# Patient Record
Sex: Male | Born: 1955 | Race: Black or African American | Hispanic: No | Smoking: Former smoker
Health system: Southern US, Community
[De-identification: ages and names within clinical notes are randomized; demographics above are authoritative.]

## PROBLEM LIST (undated history)

## (undated) DIAGNOSIS — F4310 Post-traumatic stress disorder, unspecified: Secondary | ICD-10-CM

## (undated) DIAGNOSIS — R7303 Prediabetes: Secondary | ICD-10-CM

## (undated) DIAGNOSIS — F419 Anxiety disorder, unspecified: Secondary | ICD-10-CM

## (undated) DIAGNOSIS — T8859XA Other complications of anesthesia, initial encounter: Secondary | ICD-10-CM

## (undated) DIAGNOSIS — T4145XA Adverse effect of unspecified anesthetic, initial encounter: Secondary | ICD-10-CM

## (undated) DIAGNOSIS — R413 Other amnesia: Secondary | ICD-10-CM

## (undated) DIAGNOSIS — B1920 Unspecified viral hepatitis C without hepatic coma: Secondary | ICD-10-CM

## (undated) DIAGNOSIS — K219 Gastro-esophageal reflux disease without esophagitis: Secondary | ICD-10-CM

## (undated) DIAGNOSIS — Z8619 Personal history of other infectious and parasitic diseases: Secondary | ICD-10-CM

## (undated) DIAGNOSIS — N179 Acute kidney failure, unspecified: Secondary | ICD-10-CM

## (undated) DIAGNOSIS — I639 Cerebral infarction, unspecified: Secondary | ICD-10-CM

## (undated) DIAGNOSIS — I10 Essential (primary) hypertension: Secondary | ICD-10-CM

## (undated) DIAGNOSIS — F329 Major depressive disorder, single episode, unspecified: Secondary | ICD-10-CM

## (undated) DIAGNOSIS — F32A Depression, unspecified: Secondary | ICD-10-CM

## (undated) DIAGNOSIS — Z9289 Personal history of other medical treatment: Secondary | ICD-10-CM

## (undated) DIAGNOSIS — E7800 Pure hypercholesterolemia, unspecified: Secondary | ICD-10-CM

## (undated) HISTORY — PX: SHOULDER SURGERY: SHX246

## (undated) HISTORY — PX: NASAL SINUS SURGERY: SHX719

## (undated) HISTORY — PX: TONSILLECTOMY: SUR1361

## (undated) HISTORY — PX: COLONOSCOPY: SHX174

---

## 1998-11-03 ENCOUNTER — Ambulatory Visit (HOSPITAL_COMMUNITY): Admission: RE | Admit: 1998-11-03 | Discharge: 1998-11-03 | Payer: Self-pay | Admitting: Internal Medicine

## 1998-11-03 ENCOUNTER — Encounter: Payer: Self-pay | Admitting: Internal Medicine

## 1999-06-08 ENCOUNTER — Encounter: Payer: Self-pay | Admitting: Neurosurgery

## 1999-06-08 ENCOUNTER — Ambulatory Visit (HOSPITAL_COMMUNITY): Admission: RE | Admit: 1999-06-08 | Discharge: 1999-06-08 | Payer: Self-pay | Admitting: Neurosurgery

## 2000-05-01 ENCOUNTER — Emergency Department (HOSPITAL_COMMUNITY): Admission: EM | Admit: 2000-05-01 | Discharge: 2000-05-01 | Payer: Self-pay

## 2001-03-13 ENCOUNTER — Ambulatory Visit (HOSPITAL_BASED_OUTPATIENT_CLINIC_OR_DEPARTMENT_OTHER): Admission: RE | Admit: 2001-03-13 | Discharge: 2001-03-13 | Payer: Self-pay | Admitting: Orthopedic Surgery

## 2001-07-09 ENCOUNTER — Emergency Department (HOSPITAL_COMMUNITY): Admission: EM | Admit: 2001-07-09 | Discharge: 2001-07-09 | Payer: Self-pay | Admitting: Emergency Medicine

## 2001-10-23 ENCOUNTER — Ambulatory Visit (HOSPITAL_BASED_OUTPATIENT_CLINIC_OR_DEPARTMENT_OTHER): Admission: RE | Admit: 2001-10-23 | Discharge: 2001-10-23 | Payer: Self-pay | Admitting: Orthopedic Surgery

## 2002-12-15 ENCOUNTER — Ambulatory Visit (HOSPITAL_BASED_OUTPATIENT_CLINIC_OR_DEPARTMENT_OTHER): Admission: RE | Admit: 2002-12-15 | Discharge: 2002-12-15 | Payer: Self-pay | Admitting: Orthopedic Surgery

## 2003-12-27 ENCOUNTER — Emergency Department (HOSPITAL_COMMUNITY): Admission: EM | Admit: 2003-12-27 | Discharge: 2003-12-27 | Payer: Self-pay | Admitting: *Deleted

## 2004-09-02 ENCOUNTER — Ambulatory Visit (HOSPITAL_COMMUNITY): Admission: RE | Admit: 2004-09-02 | Discharge: 2004-09-02 | Payer: Self-pay | Admitting: Gastroenterology

## 2005-01-06 ENCOUNTER — Emergency Department (HOSPITAL_COMMUNITY): Admission: EM | Admit: 2005-01-06 | Discharge: 2005-01-06 | Payer: Self-pay | Admitting: Emergency Medicine

## 2006-08-01 ENCOUNTER — Encounter: Admission: RE | Admit: 2006-08-01 | Discharge: 2006-08-01 | Payer: Self-pay | Admitting: Otolaryngology

## 2007-12-18 ENCOUNTER — Ambulatory Visit (HOSPITAL_BASED_OUTPATIENT_CLINIC_OR_DEPARTMENT_OTHER): Admission: RE | Admit: 2007-12-18 | Discharge: 2007-12-18 | Payer: Self-pay | Admitting: Orthopedic Surgery

## 2008-12-31 ENCOUNTER — Ambulatory Visit: Payer: Self-pay | Admitting: Gastroenterology

## 2009-01-07 ENCOUNTER — Encounter (INDEPENDENT_AMBULATORY_CARE_PROVIDER_SITE_OTHER): Payer: Self-pay | Admitting: Diagnostic Radiology

## 2009-01-07 ENCOUNTER — Ambulatory Visit (HOSPITAL_COMMUNITY): Admission: RE | Admit: 2009-01-07 | Discharge: 2009-01-07 | Payer: Self-pay | Admitting: Gastroenterology

## 2009-03-09 ENCOUNTER — Ambulatory Visit: Payer: Self-pay | Admitting: Gastroenterology

## 2010-04-06 ENCOUNTER — Emergency Department (HOSPITAL_COMMUNITY): Admission: EM | Admit: 2010-04-06 | Discharge: 2010-04-06 | Payer: Self-pay | Admitting: Emergency Medicine

## 2010-04-06 ENCOUNTER — Emergency Department (HOSPITAL_COMMUNITY): Admission: EM | Admit: 2010-04-06 | Discharge: 2010-04-06 | Payer: Self-pay | Admitting: Family Medicine

## 2010-04-07 ENCOUNTER — Emergency Department (HOSPITAL_COMMUNITY): Admission: EM | Admit: 2010-04-07 | Discharge: 2010-04-07 | Payer: Self-pay | Admitting: Emergency Medicine

## 2010-10-27 LAB — URINALYSIS, ROUTINE W REFLEX MICROSCOPIC
Bilirubin Urine: NEGATIVE
Glucose, UA: NEGATIVE mg/dL
Ketones, ur: NEGATIVE mg/dL
Protein, ur: NEGATIVE mg/dL
Urobilinogen, UA: 4 mg/dL — ABNORMAL HIGH (ref 0.0–1.0)
pH: 7.5 (ref 5.0–8.0)

## 2010-10-27 LAB — URINE MICROSCOPIC-ADD ON

## 2010-10-27 LAB — POCT URINALYSIS DIPSTICK
Bilirubin Urine: NEGATIVE
Ketones, ur: NEGATIVE mg/dL
Protein, ur: NEGATIVE mg/dL
Urobilinogen, UA: >=8 mg/dL (ref 0.0–1.0)
pH: 6.5 (ref 5.0–8.0)

## 2010-10-27 LAB — URINE CULTURE: Colony Count: NO GROWTH

## 2010-11-22 LAB — CBC
Hemoglobin: 15.9 g/dL (ref 13.0–17.0)
MCV: 91.8 fL (ref 78.0–100.0)
RBC: 5.13 MIL/uL (ref 4.22–5.81)

## 2010-12-27 NOTE — Op Note (Signed)
Kyle Mcconnell, Kyle Mcconnell             ACCOUNT NO.:  [id number]   MEDICAL RECORD NO.:  [id number]          PATIENT TYPE:  AMB   LOCATION:  DSC                          FACILITY:  [facility]   PHYSICIAN:  Kyle Mcconnell, M.D.   DATE OF BIRTH:  [birth year]   DATE OF PROCEDURE:  12/18/2007  DATE OF DISCHARGE:                               OPERATIVE REPORT   PREOPERATIVE DIAGNOSIS:  Impingement with a questionable rotator cuff  tear and superior labral fray.   POSTOPERATIVE DIAGNOSES:  1. Impingement.  2. Acromio-clavicular joint degenerative arthritis.  3. Chronic rotator cuff tear.  4. Severe biceps fray with superior labral tear.   PROCEDURE:  1. Mini-open rotator cuff repair chronically torn rotator cuff,      acromioplasty.  2. Arthroscopic distal clavicle resection.  3. Open biceps tenodesis.  4. Debridement in the glenohumeral joint of superior labrum anterior      to posterior and chondral defects within the glenoid.   SURGEON:  Kyle Junior, MD   ASSISTANT:  Kyle Ly, PA.   ANESTHESIA:  General.   BRIEF HISTORY:  Kyle Mcconnell is a 55 year old gentleman with a long  history of having had right shoulder pain.  We treated him  arthroscopically previously with also with acromioplasty, distal  clavicle resection.  He had a recent injury and having increasing pain  in the shoulder.  We evaluated him with MRI examination which showed  that he had a question of thinned area at the rotator cuff with a distal  supraspinatus insertion and some labral issues posteriorly.  We treated  him conservatively for a period of time and none of this worked, because  of continued complaints of pain, he was also taken to the operating room  for evaluation and surgery as necessary.   PROCEDURE:  The patient was taken to the operating room.  After adequate  anesthesia obtained with general anesthetic the patient was placed  supine on the operating table.  The right arm prepped and draped in  usual sterile fashion.  Following this, routine arthroscopic examination  of the shoulder revealed the glenohumeral joint at the biceps tendon was  frayed and attachment of the superior labrum was really less than gray  it was wanting to lift off and it was wanting to pulled out into the  joint.  The anterior labrum was coming forward, posterior labrum was  pulling down into the joint.  The glenoid had some grade 2 changes which  we debrided and went up the rotator cuff and there was an obvious distal  rotator cuff tear at the supraspinatus insertion.  At this point, biceps  tenodesis seem to be most proper course of action given that we felt  that we needed to do a rotator cuff repair on him.  Anyway, at this  point a biceps tendon was released within the glenohumeral joint.  Following this, attention was turned to the superior labrum where the  superior labrum anteriorly was debrided.  A camera was put in the front  and we looked at the back and debrided the posterior labrum so this not  be catching the joint.  We then looked at the glenoid done inferiorly,  and particularly done inferiorly and posteriorly, there was some glenoid  wear which was debrided, this showed a grade 2 cartilage defect.  Once  this was completed, attention was turned into the subacromial space  where an anterolateral acromioplasty was performed distal clavicle  resection over about 18 mm and then attention was turned to the rotator  cuff where obvious tear was there.  At this point, we felt that mini-  open rotator cuff could be appropriate because we wanted to do a biceps  tenodesis as well and so the arthroscopic portion of this case was  abandoned and attention was then turned towards the mini-open procedure.  At this point, an incision was made laterally in the subcutaneous at the  level of the deltoid.  The deltoid was divided along with the fibrous  node upon to the acromion.  At this point, a retractor was  put in place.  A bursectomy was performed.  Then, attention was turned towards the  rotator cuff tear which was easily identified at the supraspinatus.  We  extended a little bit anteriorly and got into the bicipital groove and  the biceps tendon was easily identified at this point.  At this point, a  5-5 anchor was placed in the biceps groove and a single suture was  placed strictly to the biceps tendon then another suture was placed at  the biceps tendon and this lateral rotator cuff and these were repaired  on top of one another.  Attention then turned more posteriorly where a  single 5-5 anchor was placed and then 2 horizontal mattress sutures were  placed and this was then brought over laterally to push lock giving it  an excellent double row repair.  At this point, the wound was copiously  and thoroughly irrigated, suctioned dry, and also portals were closed  with a bandage.  The deltoid was closed with 1 Vicryl running.  The skin  with 2-0 Vicryl and 3-0 Monocryl subcuticular stitch.  Benzoin, Steri-  strips were applied.  Sterile compression dressing was applied and the  patient was taken to the recovery room and was noted to be in  satisfactory condition.   ESTIMATED BLOOD LOSS:  None.      Kyle Mcconnell, M.D.  Electronically Signed     JLG/MEDQ  D:  12/18/2007  T:  12/19/2007  Job:  [id number]

## 2010-12-30 NOTE — Op Note (Signed)
Kyle Mcconnell, Kyle Mcconnell                         ACCOUNT NO.:  [id number]   MEDICAL RECORD NO.:  [id number]                   PATIENT TYPE:  AMB   LOCATION:  DSC                                  FACILITY:  [facility]   PHYSICIAN:  Harvie Junior, M.D.                DATE OF BIRTH:  May 14, 1956   DATE OF PROCEDURE:  12/15/2002  DATE OF DISCHARGE:                                 OPERATIVE REPORT   PREOPERATIVE DIAGNOSIS:  Impingement with acromioclavicular joint arthritis.   POSTOPERATIVE DIAGNOSIS:  1. Impingement with  2. Acromioclavicular joint arthritis.  3. Rotator cuff partial thickness tearing undersurface and anterior labral     tear.   OPERATION PERFORMED:  1. Anterolateral acromioplasty via a lateral and posterior portal.  2. Distal clavicle resection of the anterior portal.  3. Debridement of glenohumeral joint anterior labral tear and undersurface     partial thickness rotator cuff tear via anterior portal.   SURGEON:  Harvie Junior, M.D.   ASSISTANT:  Marshia Ly, P.A.   ANESTHESIA:  General with interscalene block.   INDICATIONS FOR PROCEDURE:  The patient is a 55 year old male with a long  history of left shoulder problems.  He ultimately as treated for his left  shoulder problems.  He began having increasing right shoulder problems,  evaluated by Callie Fielding, M.D. and felt to have right shoulder  impingement.  He had a thorough evaluation including MRI examination and  because of failure of conservative care and continued complaints of pain, he  was taken to the operating room for subacromial decompression and distal  clavicle resection and other as needed.   DESCRIPTION OF PROCEDURE:  The patient was brought to the operating room  where after adequate anesthesia was obtained with a general anesthetic, the  patient was placed supine on the operating table.  He was then moved into  the beach chair position.  All bony prominences were well padded.  At this  time  attention was turned to the right shoulder where after routine prepping  and draping, an arthroscopic examination of the shoulder was undertaken.  This examination showed that there was an obvious anterior labral tear as  well as an undersurface rotator cuff tear.  These were debrided from the  glenohumeral joint.  The posterior labrum was evaluated and felt to be  within normal limits.  The biceps tendon was evaluated and felt to be within  normal limits.  After a thorough debridement of his undersurface rotator  cuff tear, this area was probed superiorly.  There was no full thickness  area that could be identified.  At this point attention was turned out of  the glenohumeral joint.  The cannula was left in place here and attention  was turned to the subacromial spade.  An anterolateral acromioplasty was  performed to take a large anterior spur off of the humeral head.  Once this  done from the lateral portal, the camera was changed laterally and the bur  was taken to the posterior portion.  A cutting block technique was then used  medial to lateral to make sure that there was a large open space in the  subacromial space.  At this point, attention was turned to the distal  clavicle where 15 mm of distal clavicle was resected anterior to posterior.  Following this, the subacromial space was debrided with a suction shaver.  There was no evidence of partial thickness rotator cuff tearing but there  was a significant amount of bursal inflammation.  This was debrided as well  as products of the bony resection at this point.  At this point the shoulder  was copiously irrigated and suctioned dry.  The arthroscopic portals were  closed with band-aids.  A sterile compressive dressing was applied.  The  patient was taken to the recovery room where he was noted to be in  satisfactory condition.  The estimated blood loss for this procedure was  none.                                                 Harvie Junior, M.D.    Ranae Plumber  D:  12/15/2002  T:  12/15/2002  Job:  [id number]

## 2010-12-30 NOTE — Op Note (Signed)
[facility]  Patient:    Kyle Mcconnell, Kyle Mcconnell Visit Number: [id number] MRN: [id number]          Service Type: Attending:  Harvie Junior, M.D. Dictated by:   Harvie Junior, M.D. Proc. Date: 10/23/01                             Operative Report  PREOPERATIVE DIAGNOSES:  Painful shoulder status post a previous subacromial decompression with distal clavicle excision and with a painful mass just posterior to the distal clavicle.  POSTOPERATIVE DIAGNOSES:  Painful shoulder status post a previous subacromial decompression with distal clavicle excision and with a painful mass just posterior to the distal clavicle.  PRINCIPAL PROCEDURE: 1. Shoulder arthroscopy with debridement of posterior labral tear and    synovectomy in the subacromial space. 2. Open excision of mass with debridement of posterior trapezial fibers    and tissue at the superior AC joint, some cartilage and scar type tissue.  SURGEON:  Harvie Junior, M.D.  ASSISTANT:  Currie Paris. Thedore Mins.  ANESTHESIA:  General.  BRIEF HISTORY:  This is a 55 year old male with a long history of having had left shoulder pain. He ultimately underwent a subacromial decompression with a distal clavicle excision. He did well from his impingement type pain but also began having some pain essentially at the area of the distal clavicle. He had been injected in this area multiple times each of which helped with pain relief but did not cure his problem. Because of continued complaints of pain and ultimately wanting to get his situation cleared through workers comp, he was taken back to the operating room for exposure of this area and a look in the glenohumeral joint to make sure that everything was fine in that area. He was brought to the operating room for this procedure.  DESCRIPTION OF PROCEDURE:  The patient was brought to the operating room and after an adequate level of general anesthesia, the patient  placed in the supine position and then moved to the beach-chair position. All bony prominences were well padded. Attention was then turned to the left shoulder where a routine arthroscopic examination revealed there was a posterior labral tear. This was debrided from the front back to a smooth and stable rim. Attention was then turned up into the area of the rotator cuff insertion which also had some fray and this was debrided back to bleeding rim. At this time, attention was turned anteriorly where a spinal needle was placed through the area of maximal tenderness. This did come into the area of the distal clavicle excision. At this point, it was felt that an exposure in this area would be appropriate and the scope cannula was removed and attention was turned to this area. An incision was made over the distal clavicle, the subcutaneous tissue dissected down to the level of the distal clavicle. The deltotrapezial fascia was opened. There was one remaining piece of cartilaginous type tissue in this distal clavicle area. This was clearly defined and excised. There was some cartilaginous type tissue in the trapezial muscle insertion to bone and this was debrided. There was some other scar tissue which was debrided in this area as well. Following this, the deltotrapezial fascia was closed with an #0 Vicryl running suture after copious irrigation of this area and cauterization of the bleeding edges. At this point, the skin was closed with #0 and 2-0 Vicryl and the skin with  a 3-0 Maxon suture. A sterile and compressive dressing was applied and the patient was taken to the recovery room and was noted to be in satisfactory condition. Estimated blood loss for the procedure was less than 10 cc. Dictated by:   Harvie Junior, M.D. Attending:  Harvie Junior, M.D. DD:  10/23/01 TD:  10/23/01 Job: [id number]

## 2010-12-30 NOTE — Op Note (Signed)
[place]  Patient:    Kyle Mcconnell, Kyle Mcconnell                      MRN: [id number] Proc. Date: 03/13/01 Adm. Date:  [id number] Attending:  Milly Jakob                           Operative Report  PREOPERATIVE DIAGNOSES: 1. Posterior labral tear. 2. Acromioclavicular joint arthritis. 3. Partial rotator cuff tear with lateral detachment.  POSTOPERATIVE DIAGNOSES: 1. Posterior labral tear. 2. Acromioclavicular joint arthritis. 3. Partial rotator cuff tear with lateral detachment.  PROCEDURES: 1. Anterolateral subacromial decompression. 2. Distal clavicle excision. 3. Debridement of posterior labral tear, superior labral tear, and    anterior labral tear.  SURGEON:  Harvie Junior, M.D.  ANESTHESIA:  Interscalene block and general.  BRIEF HISTORY:  He is a 55 year old male with a long history of having neck and left shoulder pain.  He ultimately had been evaluated by Dr. Murray Hodgkins for a prolonged period of time and ultimately was sent over for evaluation of his left shoulder pain, given failure of conservative care.  His MRI had shown that he had significant labral pathology, mostly posterior inferior, also in the midportion posteriorly.  They felt that his biceps tendon was in reasonable shape.  They showed that he had some anterior leading edge supraspinatus insufficiency.  This was shown by MRI and also his [facility] joint arthritis and impingement.  Because of failure of conservative care, the patient was brought to the operating room for fixation of these problems.  DESCRIPTION OF PROCEDURE:  Patient brought to the operating room, where after adequate anesthesia was obtained with a general anesthetic, the patient was placed supine on the operating table, was then moved to the beach chair position.  All bony prominences were well-padded.  The left shoulder was then prepped and draped in the usual sterile fashion.  Following this,  routine arthroscopic examination of the shoulder revealed that there was obvious labral tearing anteriorly.  This was debrided with a suction shaver.  The biceps tendon was loose but anchored, could not be pulled into the joint. This was debrided near its insertion, and the attention was then turned posteriorly.  The camera was shifted in the anterior portal and the shaver introduced from behind.  The posterior labrum was identified and noted to be torn and catching in the joint.  This was debrided back to a smooth and stable rim.  Following this, attention was turned toward the rotator cuff, where the supraspinatus just next to the biceps tendon was torn partial-thickness.  This was debrided back to a smooth and stable rim.  Following this, the camera was removed from the glenohumeral joint and moved into the subacromial space.  At this point attention was turned into the subacromial space, where the anterior spur was identified.  It was a fairly significant spur.  An Arthrocare wand was used to take down the CA ligament, and the anterolateral acromioplasty was performed at this point.  Following this, attention was turned toward the [facility] joint, where the arthritic AC joint was identified and from an anterior portal, a distal clavicle excision was undertaken.  Following excision of the distal clavicle, attention was turned back, moving the camera laterally and the shaver posteriorly, and with a cutting block technique the acromion was flattened medial to lateral.  At this point the shoulder was copiously irrigated and  suctioned dry.  A final check was made.  Fifteen millimeters of distal clavicle had been excised as measured with a probe, and the anterolateral acromioplasty had been performed adequately.  The shoulder was copiously irrigated and suctioned dry, and the anterior portal was closed with interrupted sutures.  The lateral and posterior portal were closed with a bandage.  A sterile  compressive dressing was applied and the patient was taken to the recovery room, where he was noted to be in satisfactory condition. Estimated blood loss for the procedure was none. DD:  03/13/01 TD:  03/13/01 Job: [id number]

## 2010-12-30 NOTE — Op Note (Signed)
NAMECORYDON, Kyle Mcconnell             ACCOUNT NO.:  [id number]   MEDICAL RECORD NO.:  [id number]          PATIENT TYPE:  AMB   LOCATION:  ENDO                         FACILITY:  [facility]   PHYSICIAN:  John C. Madilyn Fireman, M.D.    DATE OF BIRTH:  [birth year]   DATE OF PROCEDURE:  09/02/2004  DATE OF DISCHARGE:                                 OPERATIVE REPORT   PROCEDURE PERFORMED:  Colonoscopy.   INDICATIONS FOR PROCEDURE:  Average risk colon cancer screening in a 55-year-  old patient.   PROCEDURE:  The patient was placed in the left lateral decubitus position  and placed on the pulse monitor with continuous low-flow oxygen delivered,  70 mcg IV fentanyl and 7 milligrams IV Versed. Olympus video colonoscope was  inserted into the rectum and advanced the cecum, transillumination of  McBurney's point with visualization of ileocecal valve and appendiceal  orifice. The prep was excellent. The cecum, ascending, transverse,  descending and sigmoid colon all appeared normal with no masses, polyps,  diverticula or other mucosal abnormalities. The rectum likewise appeared  normal and retroflex view of the anus revealed no obvious internal  hemorrhoids.   PLAN:  Next colon screening by sigmoidoscopy in five years.       JCH/MEDQ  D:  09/02/2004  T:  09/03/2004  Job:  [id number]   cc:   Margaretmary Bayley, M.D.  [address], [address]  [place]  [place] [address]  Fax: [phone]

## 2011-05-10 LAB — POCT HEMOGLOBIN-HEMACUE: Hemoglobin: 17.6 — ABNORMAL HIGH

## 2012-02-04 ENCOUNTER — Emergency Department (HOSPITAL_BASED_OUTPATIENT_CLINIC_OR_DEPARTMENT_OTHER)
Admission: EM | Admit: 2012-02-04 | Discharge: 2012-02-04 | Disposition: A | Discharge status: Home / Self Care | Payer: Federal, State, Local not specified - PPO | Attending: Emergency Medicine | Admitting: Emergency Medicine

## 2012-02-04 ENCOUNTER — Encounter (HOSPITAL_BASED_OUTPATIENT_CLINIC_OR_DEPARTMENT_OTHER): Payer: Self-pay | Admitting: *Deleted

## 2012-02-04 DIAGNOSIS — H109 Unspecified conjunctivitis: Secondary | ICD-10-CM | POA: Insufficient documentation

## 2012-02-04 MED ORDER — TOBRAMYCIN 0.3 % OP SOLN: 1.0000 [drp] | OPHTHALMIC | Status: AC

## 2012-02-04 NOTE — ED Provider Notes (Signed)
Medical screening examination/treatment/procedure(s) were performed by non-physician practitioner and as supervising physician I was immediately available for consultation/collaboration.   Nitin Mckowen, MD 02/04/12 2306 

## 2012-02-04 NOTE — Discharge Instructions (Signed)
Conjunctivitis Conjunctivitis is commonly called "pink eye." Conjunctivitis can be caused by bacterial or viral infection, allergies, or injuries. There is usually redness of the lining of the eye, itching, discomfort, and sometimes discharge. There may be deposits of matter along the eyelids. A viral infection usually causes a watery discharge, while a bacterial infection causes a yellowish, thick discharge. Pink eye is very contagious and spreads by direct contact. You may be given antibiotic eyedrops as part of your treatment. Before using your eye medicine, remove all drainage from the eye by washing gently with warm water and cotton balls. Continue to use the medication until you have awakened 2 mornings in a row without discharge from the eye. Do not rub your eye. This increases the irritation and helps spread infection. Use separate towels from other household members. Wash your hands with soap and water before and after touching your eyes. Use cold compresses to reduce pain and sunglasses to relieve irritation from light. Do not wear contact lenses or wear eye makeup until the infection is gone. SEEK MEDICAL CARE IF:   Your symptoms are not better after 3 days of treatment.   You have increased pain or trouble seeing.   The outer eyelids become very red or swollen.  Document Released: 09/07/2004 Document Revised: 07/20/2011 Document Reviewed: 07/31/2005 [employer] Patient Information 2012 [employer]. 

## 2012-02-04 NOTE — ED Notes (Signed)
Eye redness x 3 days

## 2012-02-04 NOTE — ED Provider Notes (Signed)
History     CSN: [id number]  Arrival date & time 02/04/12  0865   First MD Initiated Contact with Patient 02/04/12 2006      Chief Complaint  Patient presents with  . Eye Problem    (Consider location/radiation/quality/duration/timing/severity/associated sxs/prior treatment) Patient is a 56 y.o. male presenting with eye problem. The history is provided by the patient. No language interpreter was used.  Eye Problem  This is a new problem. The problem occurs constantly. The problem has been gradually worsening. There is pain in both eyes. There was no injury mechanism. The pain is mild. Associated symptoms include eye redness. He has tried nothing for the symptoms.    History reviewed. No pertinent past medical history.  Past Surgical History  Procedure Date  . Shoulder surgery     History reviewed. No pertinent family history.  History  Substance Use Topics  . Smoking status: Never Smoker   . Smokeless tobacco: Not on file  . Alcohol Use: Yes      Review of Systems  Eyes: Positive for redness.    Allergies  Review of patient's allergies indicates no known allergies.  Home Medications   Current Outpatient Rx  Name Route Sig Dispense Refill  . ALPRAZOLAM 1 MG PO TABS Oral Take 1 mg by mouth at bedtime as needed. Patient uses this medication for anxiety.    . TOBRAMYCIN SULFATE 0.3 % OP SOLN Both Eyes Place 1 drop into both eyes every 4 (four) hours. 5 mL 0    BP 158/93  Pulse 88  Temp 97.7 F (36.5 C) (Oral)  Resp 20  Ht 6' 1.5" (1.867 m)  Wt 200 lb (90.719 kg)  BMI 26.03 kg/m2  SpO2 100%  Physical Exam  Vitals reviewed. Constitutional: He appears well-developed and well-nourished.  HENT:  Head: Normocephalic and atraumatic.  Eyes: EOM are normal. Pupils are equal, round, and reactive to light.       Injected bilat conjunctiva  Neck: Normal range of motion.  Cardiovascular: Normal rate.   Pulmonary/Chest: Effort normal.  Musculoskeletal: Normal  range of motion.  Neurological: He is alert.  Skin: Skin is warm.  Psychiatric: He has a normal mood and affect.    ED Course  Procedures (including critical care time)  Labs Reviewed - No data to display No results found.   1. Conjunctivitis       MDM  tobrex opth solution        [facility] [place], [place] 02/04/12 2055

## 2012-06-11 ENCOUNTER — Emergency Department (HOSPITAL_COMMUNITY)
Admission: EM | Admit: 2012-06-11 | Discharge: 2012-06-11 | Disposition: A | Discharge status: Home / Self Care | Payer: Federal, State, Local not specified - PPO | Attending: Emergency Medicine | Admitting: Emergency Medicine

## 2012-06-11 ENCOUNTER — Encounter (HOSPITAL_COMMUNITY): Payer: Self-pay | Admitting: Emergency Medicine

## 2012-06-11 DIAGNOSIS — F3289 Other specified depressive episodes: Secondary | ICD-10-CM | POA: Insufficient documentation

## 2012-06-11 DIAGNOSIS — R066 Hiccough: Secondary | ICD-10-CM | POA: Insufficient documentation

## 2012-06-11 DIAGNOSIS — F411 Generalized anxiety disorder: Secondary | ICD-10-CM | POA: Insufficient documentation

## 2012-06-11 DIAGNOSIS — K219 Gastro-esophageal reflux disease without esophagitis: Secondary | ICD-10-CM

## 2012-06-11 DIAGNOSIS — F329 Major depressive disorder, single episode, unspecified: Secondary | ICD-10-CM | POA: Insufficient documentation

## 2012-06-11 DIAGNOSIS — B1920 Unspecified viral hepatitis C without hepatic coma: Secondary | ICD-10-CM | POA: Insufficient documentation

## 2012-06-11 DIAGNOSIS — Z79899 Other long term (current) drug therapy: Secondary | ICD-10-CM | POA: Insufficient documentation

## 2012-06-11 HISTORY — DX: Anxiety disorder, unspecified: F41.9

## 2012-06-11 HISTORY — DX: Depression, unspecified: F32.A

## 2012-06-11 HISTORY — DX: Major depressive disorder, single episode, unspecified: F32.9

## 2012-06-11 HISTORY — DX: Gastro-esophageal reflux disease without esophagitis: K21.9

## 2012-06-11 HISTORY — DX: Unspecified viral hepatitis C without hepatic coma: B19.20

## 2012-06-11 LAB — CBC
HCT: 44.1 % (ref 39.0–52.0)
MCH: 30.6 pg (ref 26.0–34.0)
MCV: 90 fL (ref 78.0–100.0)
Platelets: 198 10*3/uL (ref 150–400)
RDW: 12.8 % (ref 11.5–15.5)
WBC: 7.1 10*3/uL (ref 4.0–10.5)

## 2012-06-11 LAB — COMPREHENSIVE METABOLIC PANEL
ALT: 42 U/L (ref 0–53)
AST: 37 U/L (ref 0–37)
Chloride: 101 mEq/L (ref 96–112)
GFR calc Af Amer: >90 mL/min (ref >90)
GFR calc non Af Amer: >90 mL/min (ref >90)
Sodium: 137 mEq/L (ref 135–145)

## 2012-06-11 LAB — TROPONIN I: Troponin I: <0.3 ng/mL (ref <0.30)

## 2012-06-11 MED ORDER — GI COCKTAIL ~~LOC~~
30.0000 mL | Freq: Once | ORAL | Status: AC
  Administered 2012-06-11: 30 mL via ORAL
  Filled 2012-06-11: qty 30

## 2012-06-11 MED ORDER — FAMOTIDINE 20 MG PO TABS
20.0000 mg | ORAL_TABLET | Freq: Once | ORAL | Status: AC
  Administered 2012-06-11: 20 mg via ORAL
  Filled 2012-06-11: qty 1

## 2012-06-11 MED ORDER — PANTOPRAZOLE SODIUM 40 MG PO TBEC: 40.0000 mg | DELAYED_RELEASE_TABLET | Freq: Every day | ORAL | Status: DC

## 2012-06-11 MED ORDER — ONDANSETRON 8 MG PO TBDP
8.0000 mg | ORAL_TABLET | Freq: Once | ORAL | Status: AC
  Administered 2012-06-11: 8 mg via ORAL
  Filled 2012-06-11: qty 1

## 2012-06-11 NOTE — ED Notes (Signed)
[id number]> Expected date:<BR> Expected time:<BR> Means of arrival:<BR> Comments:<BR> GERD

## 2012-06-11 NOTE — ED Provider Notes (Signed)
History     CSN: [id number]  Arrival date & time 06/11/12  7829   First MD Initiated Contact with Patient 06/11/12 [phone]      Chief Complaint  Patient presents with  . Heartburn    (Consider location/radiation/quality/duration/timing/severity/associated sxs/prior treatment) Patient is a 56 y.o. male presenting with heartburn. The history is provided by the patient.  Heartburn Pertinent negatives include no abdominal pain, no headaches and no shortness of breath.  pt c/o 'reflux' for past 2 days. States has been constant, worse when lies flat, states will get burning/acid taste in throat and mid to upper sternal area. Non radiating. No associated nv, diaphoresis or sob. States last night caused him to have recurrent hiccoughs. Denies other episodic cp or discomfort. No exertional cp or discomfort. No unusual doe or fatigue. No abdominal pain. No nvd. Having normal bms. No fever or chills. Denies cough or uri c/o. No trouble swallowing, denies fb sensation. States has hx same, has seen GI, Dr Madilyn Fireman, denies prior upper endoscopy.   Past Medical History  Diagnosis Date  . GERD (gastroesophageal reflux disease)   . Depression   . Anxiety   . Hepatitis C     Past Surgical History  Procedure Date  . Shoulder surgery     History reviewed. No pertinent family history.  History  Substance Use Topics  . Smoking status: Never Smoker   . Smokeless tobacco: Not on file  . Alcohol Use: Yes      Review of Systems  Constitutional: Negative for fever.  HENT: Negative for neck pain.   Eyes: Negative for redness.  Respiratory: Negative for shortness of breath.   Cardiovascular: Negative for palpitations and leg swelling.  Gastrointestinal: Positive for heartburn. Negative for abdominal pain.  Genitourinary: Negative for flank pain.  Musculoskeletal: Negative for back pain.  Skin: Negative for rash.  Neurological: Negative for headaches.  Hematological: Does not bruise/bleed easily.   Psychiatric/Behavioral: Negative for confusion.    Allergies  Review of patient's allergies indicates no known allergies.  Home Medications   Current Outpatient Rx  Name Route Sig Dispense Refill  . ALPRAZOLAM 1 MG PO TABS Oral Take 1 mg by mouth at bedtime as needed. Patient uses this medication for anxiety.    . BUPROPION HCL ER (SR) 200 MG PO TB12 Oral Take 200 mg by mouth 2 (two) times daily.    Marland Kitchen HYDROCODONE-ACETAMINOPHEN 5-500 MG PO TABS Oral Take 1 tablet by mouth every 6 (six) hours as needed. Pain    . RANITIDINE HCL 150 MG PO TABS Oral Take 150 mg by mouth 2 (two) times daily.    Marland Kitchen TEMAZEPAM 30 MG PO CAPS Oral Take 30 mg by mouth at bedtime.    Marland Kitchen PENICILLIN V POTASSIUM 500 MG PO TABS Oral Take 1,000 mg by mouth 3 (three) times daily.      BP 149/80  Pulse 86  Temp 97.9 F (36.6 C) (Oral)  Resp 18  SpO2 100%  Physical Exam  Nursing note and vitals reviewed. Constitutional: He is oriented to person, place, and time. He appears well-developed and well-nourished. No distress.  HENT:  Head: Atraumatic.  Mouth/Throat: Oropharynx is clear and moist.  Eyes: Pupils are equal, round, and reactive to light.  Neck: Neck supple. No tracheal deviation present.  Cardiovascular: Normal rate, regular rhythm, normal heart sounds and intact distal pulses.  Exam reveals no gallop and no friction rub.   No murmur heard. Pulmonary/Chest: Effort normal and breath sounds normal. No  accessory muscle usage. No respiratory distress.  Abdominal: Soft. Bowel sounds are normal. He exhibits no distension and no mass. There is no tenderness. There is no rebound and no guarding.  Musculoskeletal: Normal range of motion. He exhibits no edema and no tenderness.  Neurological: He is alert and oriented to person, place, and time.  Skin: Skin is warm and dry.  Psychiatric: He has a normal mood and affect.    ED Course  Procedures (including critical care time)   Labs Reviewed  TROPONIN I  CBC    COMPREHENSIVE METABOLIC PANEL    Results for orders placed during the hospital encounter of 06/11/12  TROPONIN I      Component Value Range   Troponin I <0.30  <0.30 ng/mL  CBC      Component Value Range   WBC 7.1  4.0 - 10.5 K/uL   RBC 4.90  4.22 - 5.81 MIL/uL   Hemoglobin 15.0  13.0 - 17.0 g/dL   HCT 45.4  09.8 - 11.9 %   MCV 90.0  78.0 - 100.0 fL   MCH 30.6  26.0 - 34.0 pg   MCHC 34.0  30.0 - 36.0 g/dL   RDW 14.7  82.9 - 56.2 %   Platelets 198  150 - 400 K/uL  COMPREHENSIVE METABOLIC PANEL      Component Value Range   Sodium 137  135 - 145 mEq/L   Potassium 4.3  3.5 - 5.1 mEq/L   Chloride 101  96 - 112 mEq/L   CO2 25  19 - 32 mEq/L   Glucose, Bld 112 (*) 70 - 99 mg/dL   BUN 16  6 - 23 mg/dL   Creatinine, Ser 1.30  0.50 - 1.35 mg/dL   Calcium 9.0  8.4 - 86.5 mg/dL   Total Protein 7.2  6.0 - 8.3 g/dL   Albumin 3.6  3.5 - 5.2 g/dL   AST 37  0 - 37 U/L   ALT 42  0 - 53 U/L   Alkaline Phosphatase 72  39 - 117 U/L   Total Bilirubin 0.3  0.3 - 1.2 mg/dL   GFR calc non Af Amer >90  >90 mL/min   GFR calc Af Amer >90  >90 mL/min      MDM  Labs.  Reviewed nursing notes and prior charts for additional history.     Date: 06/11/2012  Rate: 81  Rhythm: normal sinus rhythm  QRS Axis: normal  Intervals: normal  ST/T Wave abnormalities: nonspecific ST changes  Conduction Disutrbances:none  Narrative Interpretation:   Old EKG Reviewed: unchanged No signif change compared to ecg 5/06   Pt symptoms appear c/w gerd which pt has had in past, not felt c/w acute coronary syndrome. pts troponin, after constant symtpoms x 48+ hours is normal.   Pt has been followed by Gi doctor for same in past, encouraged to follow up closely. Pt states has been out of all antacid medication for past month.         Suzi Roots, MD 06/11/12 1155

## 2012-06-11 NOTE — ED Notes (Signed)
EKG done en route was unremarkable.

## 2012-06-11 NOTE — ED Notes (Addendum)
Per EMS: Pt has been having acid reflux off and on for a month.  States that last night around 9 pm, he started having heartburn, then got hiccups.  Has been out of his acid reflux medicine for a month.  Has vomited several times.  Has had relief this morning after taking zantac.

## 2016-01-09 ENCOUNTER — Emergency Department (HOSPITAL_BASED_OUTPATIENT_CLINIC_OR_DEPARTMENT_OTHER)
Admission: EM | Admit: 2016-01-09 | Discharge: 2016-01-09 | Disposition: A | Discharge status: Home / Self Care | Payer: Federal, State, Local not specified - PPO | Attending: Emergency Medicine | Admitting: Emergency Medicine

## 2016-01-09 ENCOUNTER — Encounter (HOSPITAL_BASED_OUTPATIENT_CLINIC_OR_DEPARTMENT_OTHER): Payer: Self-pay | Admitting: Emergency Medicine

## 2016-01-09 DIAGNOSIS — H109 Unspecified conjunctivitis: Secondary | ICD-10-CM | POA: Diagnosis not present

## 2016-01-09 DIAGNOSIS — I10 Essential (primary) hypertension: Secondary | ICD-10-CM | POA: Insufficient documentation

## 2016-01-09 DIAGNOSIS — E7800 Pure hypercholesterolemia, unspecified: Secondary | ICD-10-CM | POA: Diagnosis not present

## 2016-01-09 DIAGNOSIS — R51 Headache: Secondary | ICD-10-CM | POA: Diagnosis not present

## 2016-01-09 DIAGNOSIS — R519 Headache, unspecified: Secondary | ICD-10-CM

## 2016-01-09 DIAGNOSIS — H5713 Ocular pain, bilateral: Secondary | ICD-10-CM | POA: Diagnosis present

## 2016-01-09 DIAGNOSIS — F329 Major depressive disorder, single episode, unspecified: Secondary | ICD-10-CM | POA: Diagnosis not present

## 2016-01-09 HISTORY — DX: Post-traumatic stress disorder, unspecified: F43.10

## 2016-01-09 HISTORY — DX: Pure hypercholesterolemia, unspecified: E78.00

## 2016-01-09 MED ORDER — KETOROLAC TROMETHAMINE 30 MG/ML IJ SOLN
30.0000 mg | Freq: Once | INTRAMUSCULAR | Status: AC
  Administered 2016-01-09: 30 mg via INTRAVENOUS
  Filled 2016-01-09: qty 1

## 2016-01-09 MED ORDER — TETRACAINE HCL 0.5 % OP SOLN
2.0000 [drp] | Freq: Once | OPHTHALMIC | Status: AC
Start: 2016-01-09 — End: 2016-01-09
  Administered 2016-01-09: 2 [drp] via OPHTHALMIC
  Filled 2016-01-09: qty 4

## 2016-01-09 MED ORDER — METOCLOPRAMIDE HCL 5 MG/ML IJ SOLN
10.0000 mg | Freq: Once | INTRAMUSCULAR | Status: AC
  Administered 2016-01-09: 10 mg via INTRAVENOUS
  Filled 2016-01-09: qty 2

## 2016-01-09 MED ORDER — ERYTHROMYCIN 5 MG/GM OP OINT: TOPICAL_OINTMENT | OPHTHALMIC | Status: DC

## 2016-01-09 MED ORDER — FLUORESCEIN SODIUM 1 MG OP STRP
1.0000 | ORAL_STRIP | Freq: Once | OPHTHALMIC | Status: AC
  Administered 2016-01-09: 1 via OPHTHALMIC
  Filled 2016-01-09: qty 1

## 2016-01-09 MED ORDER — SODIUM CHLORIDE 0.9 % IV BOLUS (SEPSIS)
1000.0000 mL | Freq: Once | INTRAVENOUS | Status: AC
  Administered 2016-01-09: 1000 mL via INTRAVENOUS

## 2016-01-09 MED ORDER — DIPHENHYDRAMINE HCL 50 MG/ML IJ SOLN
25.0000 mg | Freq: Once | INTRAMUSCULAR | Status: AC
  Administered 2016-01-09: 25 mg via INTRAVENOUS
  Filled 2016-01-09: qty 1

## 2016-01-09 NOTE — ED Notes (Signed)
Pt saw dentist on weds as well and got fitted for impants.  Pt due to have left upper tooth removed next week.  Pt denies any known infection in the tooth to be removed.

## 2016-01-09 NOTE — Discharge Instructions (Signed)
Kyle Mcconnell,  Nice meeting you! Please follow-up with your primary care provider. Return to the emergency department if you develop fevers, chills, worsening headache, slurred speech, weakness, visual loss, new/worsening symptoms. Feel better soon!  S. Lane HackerNicole Shanedra Lave, PA-C Bacterial Conjunctivitis Bacterial conjunctivitis (commonly called pink eye) is redness, soreness, or puffiness (inflammation) of the white part of your eye. It is caused by a germ called bacteria. These germs can easily spread from person to person (contagious). Your eye often will become red or pink. Your eye may also become irritated, watery, or have a thick discharge.  HOME CARE   Apply a cool, clean washcloth over closed eyelids. Do this for 10-20 minutes, 3-4 times a day while you have pain.  Gently wipe away any fluid coming from the eye with a warm, wet washcloth or cotton ball.  Wash your hands often with soap and water. Use paper towels to dry your hands.  Do not share towels or washcloths.  Change or wash your pillowcase every day.  Do not use eye makeup until the infection is gone.  Do not use machines or drive if your vision is blurry.  Stop using contact lenses. Do not use them again until your doctor says it is okay.  Do not touch the tip of the eye drop bottle or medicine tube with your fingers when you put medicine on the eye. GET HELP RIGHT AWAY IF:   Your eye is not better after 3 days of starting your medicine.  You have a yellowish fluid coming out of the eye.  You have more pain in the eye.  Your eye redness is spreading.  Your vision becomes blurry.  You have a fever or lasting symptoms for more than 2-3 days.  You have a fever and your symptoms suddenly get worse.  You have pain in the face.  Your face gets red or puffy (swollen). MAKE SURE YOU:   Understand these instructions.  Will watch this condition.  Will get help right away if you are not doing well or get  worse.   This information is not intended to replace advice given to you by your health care provider. Make sure you discuss any questions you have with your health care provider.   Document Released: 05/09/2008 Document Revised: 07/17/2012 Document Reviewed: 04/05/2012 Elsevier Interactive Patient Education [employer]2016 [employer] Inc.

## 2016-01-09 NOTE — ED Notes (Signed)
Pt states on weds, he was around a cat and believed his allergies were acting up, causing his left eye to tear excessively.  Pt states since weds, his eye has been watering more and c/o tenderness and swelling to left side of face, eye and top of left head.  Pt states today, right eye has started tearing a bit as well.

## 2016-01-09 NOTE — ED Provider Notes (Signed)
CSN: [id number]     Arrival date & time 01/09/16  1191 History   First MD Initiated Contact with Patient 01/09/16 [phone]     Chief Complaint  Patient presents with  . Eye Pain   HPI   Kyle Mcconnell is a 60 y.o. male PMH significant for anxiety, hep C, PTSD, high cholesterol presenting with multiple complaints since Wednesday. He states he saw a dentist on Wednesday to get fitted for dental implants and to have left upper tooth removed next week. He states on the same day he was around a cat and this caused his left eye to tear excessively. He endorses tenderness and swelling to the left side of his face. He endorses a left-sided temporal headache that he describes as a "soreness", constant, 9/10 pain scale, nonradiating. He has photophobia as well. He endorses a subjective fever at home. He denies nausea, vomiting, neck stiffness, weight changes, abdominal pain, chest pain, shortness of breath, change in bowel or bladder habits, ill contacts. He endorses a history of this previously when he was exposed to cats as well as when he had cerumen impaction.  Past Medical History  Diagnosis Date  . GERD (gastroesophageal reflux disease)   . Depression   . Anxiety   . Hepatitis C   . Post traumatic stress disorder (PTSD)   . High cholesterol    Past Surgical History  Procedure Laterality Date  . Shoulder surgery    . Tonsillectomy    . Nasal sinus surgery     No family history on file. Social History  Substance Use Topics  . Smoking status: Never Smoker   . Smokeless tobacco: None  . Alcohol Use: No    Review of Systems  Ten systems are reviewed and are negative for acute change except as noted in the HPI  Allergies  Review of patient's allergies indicates no known allergies.  Home Medications   Prior to Admission medications   Medication Sig Start Date End Date Taking? Authorizing Provider  ALPRAZolam Prudy Feeler) 1 MG tablet Take 1 mg by mouth at bedtime as needed. Patient uses  this medication for anxiety.    Historical Provider, MD  buPROPion (WELLBUTRIN SR) 200 MG 12 hr tablet Take 200 mg by mouth 2 (two) times daily.    Historical Provider, MD  HYDROcodone-acetaminophen (VICODIN) 5-500 MG per tablet Take 1 tablet by mouth every 6 (six) hours as needed. Pain    Historical Provider, MD  pantoprazole (PROTONIX) 40 MG tablet Take 1 tablet (40 mg total) by mouth daily. 06/11/12   Cathren Laine, MD  penicillin v potassium (VEETID) 500 MG tablet Take 1,000 mg by mouth 3 (three) times daily. 05/28/12   Historical Provider, MD  ranitidine (ZANTAC) 150 MG tablet Take 150 mg by mouth 2 (two) times daily.    Historical Provider, MD  temazepam (RESTORIL) 30 MG capsule Take 30 mg by mouth at bedtime.    Historical Provider, MD   BP 150/99 mmHg  Pulse 94  Temp(Src) 98.3 F (36.8 C) (Oral)  Resp 20  Ht 6' 1.5" (1.867 m)  Wt 83.915 kg  BMI 24.07 kg/m2  SpO2 98% Physical Exam  Constitutional: He appears well-developed and well-nourished. No distress.  Poor dentition  HENT:  Head: Normocephalic and atraumatic.  Mouth/Throat: Oropharynx is clear and moist. No oropharyngeal exudate.  BL TM cerumen impaction  Eyes: Conjunctivae and EOM are normal. Pupils are equal, round, and reactive to light. Right eye exhibits no discharge. Left eye exhibits  discharge. No scleral icterus.  Injected conjunctiva BL, L>R with clear discharge. Senile arcus BL. Fluorescein exam unremarkable bilaterally  Neck: Neck supple. No tracheal deviation present.  Cardiovascular: Normal rate, regular rhythm, normal heart sounds and intact distal pulses.  Exam reveals no gallop and no friction rub.   No murmur heard. Pulmonary/Chest: Effort normal and breath sounds normal. No respiratory distress. He has no wheezes. He has no rales. He exhibits no tenderness.  Abdominal: Soft. Bowel sounds are normal. He exhibits no distension and no mass. There is no tenderness. There is no rebound and no guarding.   Musculoskeletal: He exhibits no edema.  Lymphadenopathy:    He has no cervical adenopathy.  Neurological: He is alert. Coordination normal.  Skin: Skin is warm and dry. No rash noted. He is not diaphoretic. No erythema.  Psychiatric: He has a normal mood and affect. His behavior is normal.  Nursing note and vitals reviewed.   ED Course  Procedures   MDM   Final diagnoses:  Bilateral conjunctivitis  Essential hypertension  Headache, unspecified headache type   Patient nontoxic-appearing, vital signs stable. Unremarkable neuro exam. Patient presenting with multiple complaints. Patient appears to have bilateral conjunctivitis. Headache treated with migraine cocktail patient improved upon reexamination. Discussed hypertension today and encouraged patient to have repeat blood pressure checked in 3 weeks with PCP. Patient sent home with erythromycin for conjunctivitis. Pt's HA non concerning for SAH, ICH, Meningitis, or temporal arteritis. Pt is afebrile with no focal neuro deficits, nuchal rigidity, or change in vision. Pt is to follow up with PCP to discuss prophylactic medication. Pt verbalizes understanding and is agreeable with plan to dc.   Melton KrebsSamantha Nicole Lachelle Rissler, PA-C 01/09/16 1010  Doug SouSam Jacubowitz, MD 01/09/16 1558

## 2016-01-09 NOTE — ED Provider Notes (Signed)
Patient reports left eye discomfort for the past 2 days now has mild discomfort in right eye as well. He awakened this morning with eyelashes stuck together on left eye. He also complains of a left-sided parietotemporal headache for the past 2 days. No nausea or vomiting. No fever Also complains of painful upper teeth for several weeks. -, Stating that he is supposed to have oral surgery scheduled for June 9 to have all of his upper teeth removed. Kyle Mcconnell patient is alert and in no distress Glasgow Coma Score 15 HEENT exam no facial asymmetry. Generally poor dentition. No trismus. No cervical adenopathy. Left eye with mild subchondral conjunctival erythema. No pain on extraocular movement. Neurologic Glasgow Coma Score 15 cranial nerves II through XII grossly intact moves all extremities well. Gait normal  Kyle SouSam Onyekachi Gathright, MD 01/09/16 [phone]

## 2018-02-28 ENCOUNTER — Other Ambulatory Visit: Payer: Self-pay

## 2018-02-28 ENCOUNTER — Encounter (HOSPITAL_BASED_OUTPATIENT_CLINIC_OR_DEPARTMENT_OTHER): Payer: Self-pay | Admitting: Emergency Medicine

## 2018-02-28 ENCOUNTER — Emergency Department (HOSPITAL_BASED_OUTPATIENT_CLINIC_OR_DEPARTMENT_OTHER)
Admission: EM | Admit: 2018-02-28 | Discharge: 2018-02-28 | Disposition: A | Discharge status: Home / Self Care | Payer: Non-veteran care | Attending: Emergency Medicine | Admitting: Emergency Medicine

## 2018-02-28 DIAGNOSIS — Z79899 Other long term (current) drug therapy: Secondary | ICD-10-CM | POA: Diagnosis not present

## 2018-02-28 DIAGNOSIS — R040 Epistaxis: Secondary | ICD-10-CM | POA: Diagnosis not present

## 2018-02-28 MED ORDER — SALINE SPRAY 0.65 % NA SOLN: 1.0000 | Freq: Three times a day (TID) | NASAL | 0 refills | Status: DC

## 2018-02-28 MED FILL — DEEP SEA 0.65% NOSE SPRAY: 0.65 | 7 days supply | Qty: 44 | Fill #0

## 2018-02-28 NOTE — ED Provider Notes (Signed)
[facility] EMERGENCY DEPARTMENT Provider Note   CSN: [id number] Arrival date & time: 02/28/18  [id number]     History   Chief Complaint Chief Complaint  Patient presents with  . Epistaxis    HPI Kyle Mcconnell is a 62 y.o. male.  Patient presents today with epistaxis.  This is been going on for 3 months, and he states he has come in because the episode yesterday was worse than normal and [facility] P[facility] the [facility] was concerned. Not on any blood thinner. Denies other rhinorrhea or allergic symptoms.  States he has never had something like this before.  Previously saw ENT for sinus issues.  He is worried that this is related to his dental implants which have a "screw" into the maxillary area.  No noted pain around this or dislodged implant.  He is able to stop the nosebleeds at home with direct pressure.  He called his PCP who sent him to the emergency room.  Notably, he does have high blood pressure and is working with his PCP for treatment of this.     Past Medical History:  Diagnosis Date  . Anxiety   . Depression   . GERD (gastroesophageal reflux disease)   . Hepatitis C   . High cholesterol   . Post traumatic stress disorder (PTSD)     There are no active problems to display for this patient.   Past Surgical History:  Procedure Laterality Date  . NASAL SINUS SURGERY    . SHOULDER SURGERY    . TONSILLECTOMY          Home Medications    Prior to Admission medications   Medication Sig Start Date End Date Taking? Authorizing Provider  lisinopril (PRINIVIL,ZESTRIL) 40 MG tablet Take 40 mg by mouth daily.   Yes [provider]  pravastatin (PRAVACHOL) 40 MG tablet Take by mouth. 06/16/15  Yes [provider]  QUEtiapine (SEROQUEL) 200 MG tablet 200 mg nightly. 02/20/15  Yes [provider]  SILDENAFIL CITRATE PO Take by mouth as needed.   Yes [provider]  temazepam (RESTORIL) 30 MG capsule Take by mouth. 01/15/15  Yes [provider]  ALPRAZolam Prudy Feeler(XANAX) 1 MG tablet Take 1 mg by mouth at bedtime as needed. Patient uses this medication for anxiety.    [provider]  alprazolam Prudy Feeler(XANAX) 2 MG tablet TK 1 T PO QID 12/24/17   [provider]  buPROPion (WELLBUTRIN SR) 200 MG 12 hr tablet Take 200 mg by mouth 2 (two) times daily.    [provider]  Cholecalciferol 1000 units capsule Take by mouth.    [provider]  erythromycin ophthalmic ointment Place a 1/2 inch ribbon of ointment into the lower eyelid. 01/09/16   Melton Krebsiley, Samantha Nicole, PA-C  HYDROcodone-acetaminophen (VICODIN) 5-500 MG per tablet Take 1 tablet by mouth every 6 (six) hours as needed. Pain    [provider]  mirtazapine (REMERON) 30 MG tablet TK 1 T PO QHS 01/25/18   [provider]  pantoprazole (PROTONIX) 40 MG tablet Take 1 tablet (40 mg total) by mouth daily. 06/11/12   Cathren LaineSteinl, Kevin, MD  penicillin v potassium (VEETID) 500 MG tablet Take 1,000 mg by mouth 3 (three) times daily. 05/28/12   [provider]  ranitidine (ZANTAC) 150 MG tablet Take 150 mg by mouth 2 (two) times daily.    [provider]  sodium chloride (OCEAN) 0.65 % SOLN nasal spray Place 1 spray into both  nostrils 3 (three) times daily. 02/28/18   Little, Ambrose Finland, MD  temazepam (RESTORIL) 30 MG capsule Take 30 mg by mouth at bedtime.    [provider]    Family History No family history on file.  Social History Social History   Tobacco Use  . Smoking status: Never Smoker  . Smokeless tobacco: Never Used  Substance Use Topics  . Alcohol use: No  . Drug use: No     Allergies   Patient has no known allergies.   Review of Systems Review of Systems  Constitutional: Negative for activity change, appetite change and fever.  Musculoskeletal: Negative for myalgias.  Neurological: Negative for dizziness and light-headedness.  Hematological: Bruises/bleeds easily.  All other systems  reviewed and are negative.    Physical Exam Updated Vital Signs BP (!) 164/94   Pulse 86   Temp 97.8 F (36.6 C) (Oral)   Resp 18   Ht 6' 1.5" (1.867 m)   Wt 87.1 kg (192 lb)   SpO2 96%   BMI 24.99 kg/m   Physical Exam  Constitutional: He is oriented to person, place, and time.  HENT:  Head: Normocephalic and atraumatic.  Right Ear: External ear normal.  Left Ear: External ear normal.  Mouth/Throat: Oropharynx is clear and moist.  One nasal erosion of lateral nare, nonbleeding.  Eyes: Conjunctivae and EOM are normal.  Neck: Neck supple.  Cardiovascular: Normal rate, regular rhythm and normal heart sounds.  Pulmonary/Chest: Effort normal and breath sounds normal.  Abdominal: Soft.  Musculoskeletal: Normal range of motion. He exhibits no edema.  Neurological: He is alert and oriented to person, place, and time.  Skin: Skin is warm and dry. Capillary refill takes less than 2 seconds. No pallor.     ED Treatments / Results  Labs (all labs ordered are listed, but only abnormal results are displayed) Labs Reviewed - No data to display  EKG None  Radiology No results found.  Procedures Procedures (including critical care time)  Medications Ordered in ED Medications - No data to display   Initial Impression / Assessment and Plan / ED Course  I have reviewed the triage vital signs and the nursing notes.  Pertinent labs & imaging results that were available during my care of the patient were reviewed by me and considered in my medical decision making (see chart for details).     Patient with hx of epistaxis, no current epistaxis.  No other bleeding symptoms, not on blood thinners. Will encourage hydration of nasal mucosa with nasal saline BID or TID. Gave instruction for afrin and pressure if recurrent. Previously saw ENT for sinus issues, gave Dr. Avel Sensor number again for follow up as needed. Return if future bleeding is uncontrolled by pressure. [facility]   Final Clinical Impressions(s) / ED Diagnoses   Final diagnoses:  Epistaxis, recurrent    ED Discharge Orders        Ordered    sodium chloride (OCEAN) 0.65 % SOLN nasal spray  3 times daily     02/28/18 1036       Garth Bigness, MD 02/28/18 1341    Little, Ambrose Finland, MD 03/01/18 [phone]

## 2018-02-28 NOTE — Discharge Instructions (Signed)
Nosebleeds are typically caused by dryness with an ear nose or allergy symptoms.  You need to hydrate the skin inside your nose with nasal saline, over-the-counter, twice a day if not more frequently.  If you have an acute nosebleed, please spray Afrin nasal spray inside your nose and hold continuous pressure without peaking for 20 minutes. Repeat one additional spray of Afrin and hold for another 20 minutes without looking.  If you remove pressure and look to see if it still bleeding, the clot will likely dislodge and continue to bleed.  Come into the emergency room if 2-3 rounds of the Afrin spray does not resolve your bleeding.  Please also call Dr. Suszanne Connerseoh to evaluate your recurrent nosebleeds.

## 2018-02-28 NOTE — ED Triage Notes (Signed)
Pt reports nosebleeds for 3 mos, especially notices this happens when he bends over

## 2018-02-28 NOTE — ED Notes (Signed)
Pt verbalized understanding of dc instructions.

## 2018-04-08 ENCOUNTER — Ambulatory Visit (INDEPENDENT_AMBULATORY_CARE_PROVIDER_SITE_OTHER): Payer: Federal, State, Local not specified - PPO | Admitting: Otolaryngology

## 2018-04-08 DIAGNOSIS — R040 Epistaxis: Secondary | ICD-10-CM | POA: Diagnosis not present

## 2018-05-09 ENCOUNTER — Ambulatory Visit (INDEPENDENT_AMBULATORY_CARE_PROVIDER_SITE_OTHER): Payer: Federal, State, Local not specified - PPO | Admitting: Otolaryngology

## 2018-05-09 DIAGNOSIS — R040 Epistaxis: Secondary | ICD-10-CM | POA: Diagnosis not present

## 2018-09-14 DIAGNOSIS — I639 Cerebral infarction, unspecified: Secondary | ICD-10-CM

## 2018-09-14 DIAGNOSIS — N179 Acute kidney failure, unspecified: Secondary | ICD-10-CM

## 2018-09-14 HISTORY — DX: Acute kidney failure, unspecified: N17.9

## 2018-09-14 HISTORY — DX: Cerebral infarction, unspecified: I63.9

## 2018-09-30 ENCOUNTER — Inpatient Hospital Stay (HOSPITAL_COMMUNITY): Payer: No Typology Code available for payment source

## 2018-09-30 ENCOUNTER — Inpatient Hospital Stay (HOSPITAL_COMMUNITY)
Admission: EM | Admit: 2018-09-30 | Discharge: 2018-10-14 | DRG: 957 | Disposition: A | Discharge status: Rehabilitation Facility | Payer: No Typology Code available for payment source | Attending: General Surgery | Admitting: General Surgery

## 2018-09-30 ENCOUNTER — Emergency Department (HOSPITAL_COMMUNITY): Payer: No Typology Code available for payment source

## 2018-09-30 ENCOUNTER — Inpatient Hospital Stay (HOSPITAL_COMMUNITY): Payer: No Typology Code available for payment source | Admitting: Certified Registered"

## 2018-09-30 ENCOUNTER — Encounter (HOSPITAL_COMMUNITY): Admission: EM | Disposition: A | Discharge status: Rehabilitation Facility | Payer: Self-pay | Source: Home / Self Care

## 2018-09-30 ENCOUNTER — Encounter (HOSPITAL_COMMUNITY): Payer: Self-pay

## 2018-09-30 DIAGNOSIS — S35512D Injury of left iliac artery, subsequent encounter: Secondary | ICD-10-CM | POA: Diagnosis not present

## 2018-09-30 DIAGNOSIS — I959 Hypotension, unspecified: Secondary | ICD-10-CM | POA: Diagnosis present

## 2018-09-30 DIAGNOSIS — K219 Gastro-esophageal reflux disease without esophagitis: Secondary | ICD-10-CM | POA: Diagnosis present

## 2018-09-30 DIAGNOSIS — F329 Major depressive disorder, single episode, unspecified: Secondary | ICD-10-CM | POA: Diagnosis present

## 2018-09-30 DIAGNOSIS — S32452A Displaced transverse fracture of left acetabulum, initial encounter for closed fracture: Secondary | ICD-10-CM | POA: Diagnosis not present

## 2018-09-30 DIAGNOSIS — S7012XS Contusion of left thigh, sequela: Secondary | ICD-10-CM | POA: Diagnosis not present

## 2018-09-30 DIAGNOSIS — I63312 Cerebral infarction due to thrombosis of left middle cerebral artery: Secondary | ICD-10-CM | POA: Diagnosis not present

## 2018-09-30 DIAGNOSIS — B182 Chronic viral hepatitis C: Secondary | ICD-10-CM | POA: Diagnosis not present

## 2018-09-30 DIAGNOSIS — S82871A Displaced pilon fracture of right tibia, initial encounter for closed fracture: Secondary | ICD-10-CM | POA: Diagnosis present

## 2018-09-30 DIAGNOSIS — S22088A Other fracture of T11-T12 vertebra, initial encounter for closed fracture: Secondary | ICD-10-CM | POA: Diagnosis present

## 2018-09-30 DIAGNOSIS — I69322 Dysarthria following cerebral infarction: Secondary | ICD-10-CM | POA: Diagnosis not present

## 2018-09-30 DIAGNOSIS — T1490XA Injury, unspecified, initial encounter: Secondary | ICD-10-CM | POA: Diagnosis not present

## 2018-09-30 DIAGNOSIS — S0101XA Laceration without foreign body of scalp, initial encounter: Secondary | ICD-10-CM | POA: Diagnosis present

## 2018-09-30 DIAGNOSIS — Z23 Encounter for immunization: Secondary | ICD-10-CM

## 2018-09-30 DIAGNOSIS — Z87891 Personal history of nicotine dependence: Secondary | ICD-10-CM

## 2018-09-30 DIAGNOSIS — S32810A Multiple fractures of pelvis with stable disruption of pelvic ring, initial encounter for closed fracture: Secondary | ICD-10-CM | POA: Diagnosis not present

## 2018-09-30 DIAGNOSIS — D72829 Elevated white blood cell count, unspecified: Secondary | ICD-10-CM

## 2018-09-30 DIAGNOSIS — R29706 NIHSS score 6: Secondary | ICD-10-CM | POA: Diagnosis present

## 2018-09-30 DIAGNOSIS — T148XXA Other injury of unspecified body region, initial encounter: Secondary | ICD-10-CM

## 2018-09-30 DIAGNOSIS — T794XXA Traumatic shock, initial encounter: Secondary | ICD-10-CM | POA: Diagnosis present

## 2018-09-30 DIAGNOSIS — S22088D Other fracture of T11-T12 vertebra, subsequent encounter for fracture with routine healing: Secondary | ICD-10-CM | POA: Diagnosis not present

## 2018-09-30 DIAGNOSIS — D62 Acute posthemorrhagic anemia: Secondary | ICD-10-CM

## 2018-09-30 DIAGNOSIS — R739 Hyperglycemia, unspecified: Secondary | ICD-10-CM | POA: Diagnosis not present

## 2018-09-30 DIAGNOSIS — S7012XA Contusion of left thigh, initial encounter: Secondary | ICD-10-CM | POA: Diagnosis present

## 2018-09-30 DIAGNOSIS — E871 Hypo-osmolality and hyponatremia: Secondary | ICD-10-CM | POA: Diagnosis present

## 2018-09-30 DIAGNOSIS — J189 Pneumonia, unspecified organism: Secondary | ICD-10-CM | POA: Diagnosis not present

## 2018-09-30 DIAGNOSIS — K567 Ileus, unspecified: Secondary | ICD-10-CM | POA: Diagnosis not present

## 2018-09-30 DIAGNOSIS — Z419 Encounter for procedure for purposes other than remedying health state, unspecified: Secondary | ICD-10-CM

## 2018-09-30 DIAGNOSIS — N179 Acute kidney failure, unspecified: Secondary | ICD-10-CM

## 2018-09-30 DIAGNOSIS — T508X5A Adverse effect of diagnostic agents, initial encounter: Secondary | ICD-10-CM | POA: Diagnosis present

## 2018-09-30 DIAGNOSIS — S82391A Other fracture of lower end of right tibia, initial encounter for closed fracture: Secondary | ICD-10-CM

## 2018-09-30 DIAGNOSIS — M7989 Other specified soft tissue disorders: Secondary | ICD-10-CM | POA: Diagnosis not present

## 2018-09-30 DIAGNOSIS — E875 Hyperkalemia: Secondary | ICD-10-CM | POA: Diagnosis present

## 2018-09-30 DIAGNOSIS — E785 Hyperlipidemia, unspecified: Secondary | ICD-10-CM | POA: Diagnosis present

## 2018-09-30 DIAGNOSIS — N170 Acute kidney failure with tubular necrosis: Secondary | ICD-10-CM | POA: Diagnosis not present

## 2018-09-30 DIAGNOSIS — N5089 Other specified disorders of the male genital organs: Secondary | ICD-10-CM | POA: Diagnosis not present

## 2018-09-30 DIAGNOSIS — F4310 Post-traumatic stress disorder, unspecified: Secondary | ICD-10-CM | POA: Diagnosis present

## 2018-09-30 DIAGNOSIS — G8191 Hemiplegia, unspecified affecting right dominant side: Secondary | ICD-10-CM | POA: Diagnosis not present

## 2018-09-30 DIAGNOSIS — S0101XD Laceration without foreign body of scalp, subsequent encounter: Secondary | ICD-10-CM | POA: Diagnosis not present

## 2018-09-30 DIAGNOSIS — S32402A Unspecified fracture of left acetabulum, initial encounter for closed fracture: Secondary | ICD-10-CM | POA: Diagnosis not present

## 2018-09-30 DIAGNOSIS — R7303 Prediabetes: Secondary | ICD-10-CM | POA: Diagnosis present

## 2018-09-30 DIAGNOSIS — R740 Nonspecific elevation of levels of transaminase and lactic acid dehydrogenase [LDH]: Secondary | ICD-10-CM | POA: Diagnosis not present

## 2018-09-30 DIAGNOSIS — G8918 Other acute postprocedural pain: Secondary | ICD-10-CM | POA: Diagnosis not present

## 2018-09-30 DIAGNOSIS — R000 Tachycardia, unspecified: Secondary | ICD-10-CM | POA: Diagnosis not present

## 2018-09-30 DIAGNOSIS — F418 Other specified anxiety disorders: Secondary | ICD-10-CM | POA: Diagnosis present

## 2018-09-30 DIAGNOSIS — M2560 Stiffness of unspecified joint, not elsewhere classified: Secondary | ICD-10-CM

## 2018-09-30 DIAGNOSIS — I6310 Cerebral infarction due to embolism of unspecified precerebral artery: Secondary | ICD-10-CM | POA: Diagnosis not present

## 2018-09-30 DIAGNOSIS — S22009A Unspecified fracture of unspecified thoracic vertebra, initial encounter for closed fracture: Secondary | ICD-10-CM

## 2018-09-30 DIAGNOSIS — E669 Obesity, unspecified: Secondary | ICD-10-CM | POA: Diagnosis present

## 2018-09-30 DIAGNOSIS — S329XXA Fracture of unspecified parts of lumbosacral spine and pelvis, initial encounter for closed fracture: Secondary | ICD-10-CM | POA: Diagnosis present

## 2018-09-30 DIAGNOSIS — T07XXXA Unspecified multiple injuries, initial encounter: Secondary | ICD-10-CM | POA: Diagnosis not present

## 2018-09-30 DIAGNOSIS — S32592D Other specified fracture of left pubis, subsequent encounter for fracture with routine healing: Secondary | ICD-10-CM | POA: Diagnosis not present

## 2018-09-30 DIAGNOSIS — I6340 Cerebral infarction due to embolism of unspecified cerebral artery: Secondary | ICD-10-CM | POA: Diagnosis not present

## 2018-09-30 DIAGNOSIS — S7012XD Contusion of left thigh, subsequent encounter: Secondary | ICD-10-CM | POA: Diagnosis not present

## 2018-09-30 DIAGNOSIS — S01511A Laceration without foreign body of lip, initial encounter: Secondary | ICD-10-CM | POA: Diagnosis present

## 2018-09-30 DIAGNOSIS — Y92410 Unspecified street and highway as the place of occurrence of the external cause: Secondary | ICD-10-CM | POA: Diagnosis not present

## 2018-09-30 DIAGNOSIS — S32019A Unspecified fracture of first lumbar vertebra, initial encounter for closed fracture: Secondary | ICD-10-CM | POA: Diagnosis present

## 2018-09-30 DIAGNOSIS — R29898 Other symptoms and signs involving the musculoskeletal system: Secondary | ICD-10-CM | POA: Diagnosis not present

## 2018-09-30 DIAGNOSIS — Z79899 Other long term (current) drug therapy: Secondary | ICD-10-CM

## 2018-09-30 DIAGNOSIS — S32452D Displaced transverse fracture of left acetabulum, subsequent encounter for fracture with routine healing: Secondary | ICD-10-CM | POA: Diagnosis present

## 2018-09-30 DIAGNOSIS — J13 Pneumonia due to Streptococcus pneumoniae: Secondary | ICD-10-CM | POA: Diagnosis not present

## 2018-09-30 DIAGNOSIS — T796XXA Traumatic ischemia of muscle, initial encounter: Secondary | ICD-10-CM | POA: Diagnosis present

## 2018-09-30 DIAGNOSIS — Y92481 Parking lot as the place of occurrence of the external cause: Secondary | ICD-10-CM | POA: Diagnosis not present

## 2018-09-30 DIAGNOSIS — M25571 Pain in right ankle and joints of right foot: Secondary | ICD-10-CM | POA: Diagnosis present

## 2018-09-30 DIAGNOSIS — B1920 Unspecified viral hepatitis C without hepatic coma: Secondary | ICD-10-CM | POA: Diagnosis present

## 2018-09-30 DIAGNOSIS — N183 Chronic kidney disease, stage 3 (moderate): Secondary | ICD-10-CM | POA: Diagnosis present

## 2018-09-30 DIAGNOSIS — I63412 Cerebral infarction due to embolism of left middle cerebral artery: Secondary | ICD-10-CM | POA: Diagnosis not present

## 2018-09-30 DIAGNOSIS — S32391A Other fracture of right ilium, initial encounter for closed fracture: Secondary | ICD-10-CM | POA: Diagnosis not present

## 2018-09-30 DIAGNOSIS — S82871D Displaced pilon fracture of right tibia, subsequent encounter for closed fracture with routine healing: Secondary | ICD-10-CM | POA: Diagnosis not present

## 2018-09-30 DIAGNOSIS — S01511D Laceration without foreign body of lip, subsequent encounter: Secondary | ICD-10-CM | POA: Diagnosis not present

## 2018-09-30 DIAGNOSIS — S35512A Injury of left iliac artery, initial encounter: Secondary | ICD-10-CM | POA: Diagnosis not present

## 2018-09-30 DIAGNOSIS — D696 Thrombocytopenia, unspecified: Secondary | ICD-10-CM | POA: Diagnosis present

## 2018-09-30 DIAGNOSIS — I69331 Monoplegia of upper limb following cerebral infarction affecting right dominant side: Secondary | ICD-10-CM | POA: Diagnosis not present

## 2018-09-30 DIAGNOSIS — S8251XD Displaced fracture of medial malleolus of right tibia, subsequent encounter for closed fracture with routine healing: Secondary | ICD-10-CM | POA: Diagnosis not present

## 2018-09-30 DIAGNOSIS — I639 Cerebral infarction, unspecified: Secondary | ICD-10-CM | POA: Diagnosis not present

## 2018-09-30 DIAGNOSIS — I129 Hypertensive chronic kidney disease with stage 1 through stage 4 chronic kidney disease, or unspecified chronic kidney disease: Secondary | ICD-10-CM | POA: Diagnosis present

## 2018-09-30 DIAGNOSIS — N141 Nephropathy induced by other drugs, medicaments and biological substances: Secondary | ICD-10-CM | POA: Diagnosis present

## 2018-09-30 DIAGNOSIS — G4700 Insomnia, unspecified: Secondary | ICD-10-CM | POA: Diagnosis present

## 2018-09-30 DIAGNOSIS — I10 Essential (primary) hypertension: Secondary | ICD-10-CM | POA: Diagnosis not present

## 2018-09-30 DIAGNOSIS — K5900 Constipation, unspecified: Secondary | ICD-10-CM | POA: Diagnosis present

## 2018-09-30 DIAGNOSIS — R509 Fever, unspecified: Secondary | ICD-10-CM | POA: Diagnosis not present

## 2018-09-30 DIAGNOSIS — F4312 Post-traumatic stress disorder, chronic: Secondary | ICD-10-CM | POA: Diagnosis present

## 2018-09-30 DIAGNOSIS — S22089A Unspecified fracture of T11-T12 vertebra, initial encounter for closed fracture: Secondary | ICD-10-CM | POA: Diagnosis not present

## 2018-09-30 DIAGNOSIS — S82874A Nondisplaced pilon fracture of right tibia, initial encounter for closed fracture: Secondary | ICD-10-CM | POA: Diagnosis not present

## 2018-09-30 DIAGNOSIS — E876 Hypokalemia: Secondary | ICD-10-CM | POA: Diagnosis present

## 2018-09-30 DIAGNOSIS — R0989 Other specified symptoms and signs involving the circulatory and respiratory systems: Secondary | ICD-10-CM | POA: Diagnosis not present

## 2018-09-30 HISTORY — PX: IR US GUIDE VASC ACCESS RIGHT: IMG2390

## 2018-09-30 HISTORY — DX: Personal history of other infectious and parasitic diseases: Z86.19

## 2018-09-30 HISTORY — PX: EXTERNAL FIXATION LEG: SHX1549

## 2018-09-30 HISTORY — PX: IR ANGIOGRAM EXTREMITY RIGHT: IMG652

## 2018-09-30 HISTORY — PX: IR ANGIOGRAM PELVIS SELECTIVE OR SUPRASELECTIVE: IMG661

## 2018-09-30 HISTORY — PX: IR EMBO ART  VEN HEMORR LYMPH EXTRAV  INC GUIDE ROADMAPPING: IMG5450

## 2018-09-30 HISTORY — DX: Essential (primary) hypertension: I10

## 2018-09-30 HISTORY — DX: Post-traumatic stress disorder, unspecified: F43.10

## 2018-09-30 HISTORY — PX: IR ANGIOGRAM SELECTIVE EACH ADDITIONAL VESSEL: IMG667

## 2018-09-30 LAB — I-STAT CREATININE, ED: CREATININE: 1.3 mg/dL — AB (ref 0.61–1.24)

## 2018-09-30 LAB — COMPREHENSIVE METABOLIC PANEL WITH GFR
ALT: 33 U/L (ref 0–44)
AST: 54 U/L — ABNORMAL HIGH (ref 15–41)
Albumin: 3.7 g/dL (ref 3.5–5.0)
Alkaline Phosphatase: 58 U/L (ref 38–126)
Anion gap: 13 (ref 5–15)
BUN: 11 mg/dL (ref 8–23)
CO2: 23 mmol/L (ref 22–32)
Calcium: 8.8 mg/dL — ABNORMAL LOW (ref 8.9–10.3)
Chloride: 104 mmol/L (ref 98–111)
Creatinine, Ser: 1.38 mg/dL — ABNORMAL HIGH (ref 0.61–1.24)
GFR calc Af Amer: >60 mL/min
GFR calc non Af Amer: 54 mL/min — ABNORMAL LOW
Glucose, Bld: 167 mg/dL — ABNORMAL HIGH (ref 70–99)
Potassium: 3.5 mmol/L (ref 3.5–5.1)
Sodium: 140 mmol/L (ref 135–145)
Total Bilirubin: 0.6 mg/dL (ref 0.3–1.2)
Total Protein: 6.6 g/dL (ref 6.5–8.1)

## 2018-09-30 LAB — POCT I-STAT EG7
Bicarbonate: 23.4 mmol/L (ref 20.0–28.0)
Calcium, Ion: 0.91 mmol/L — ABNORMAL LOW (ref 1.15–1.40)
HCT: 41 % (ref 39.0–52.0)
Hemoglobin: 13.9 g/dL (ref 13.0–17.0)
O2 Saturation: 85 %
Potassium: 3.8 mmol/L (ref 3.5–5.1)
Sodium: 139 mmol/L (ref 135–145)
TCO2: 24 mmol/L (ref 22–32)
pCO2, Ven: 33.1 mmHg — ABNORMAL LOW (ref 44.0–60.0)
pH, Ven: 7.456 — ABNORMAL HIGH (ref 7.250–7.430)
pO2, Ven: 47 mmHg — ABNORMAL HIGH (ref 32.0–45.0)

## 2018-09-30 LAB — URINALYSIS, ROUTINE W REFLEX MICROSCOPIC
Bilirubin Urine: NEGATIVE
Glucose, UA: 50 mg/dL — AB
Ketones, ur: NEGATIVE mg/dL
Leukocytes,Ua: NEGATIVE
Nitrite: NEGATIVE
Protein, ur: NEGATIVE mg/dL
RBC / HPF: >50 RBC/hpf — ABNORMAL HIGH (ref 0–5)
Specific Gravity, Urine: 1.036 — ABNORMAL HIGH (ref 1.005–1.030)
pH: 6 (ref 5.0–8.0)

## 2018-09-30 LAB — CBC
HCT: 44.6 % (ref 39.0–52.0)
Hemoglobin: 13.7 g/dL (ref 13.0–17.0)
MCH: 30 pg (ref 26.0–34.0)
MCHC: 30.7 g/dL (ref 30.0–36.0)
MCV: 97.6 fL (ref 80.0–100.0)
Platelets: 182 10*3/uL (ref 150–400)
RBC: 4.57 MIL/uL (ref 4.22–5.81)
RDW: 13.3 % (ref 11.5–15.5)
WBC: 8.4 10*3/uL (ref 4.0–10.5)
nRBC: 0.4 % — ABNORMAL HIGH (ref 0.0–0.2)

## 2018-09-30 LAB — GLUCOSE, CAPILLARY: GLUCOSE-CAPILLARY: 148 mg/dL — AB (ref 70–99)

## 2018-09-30 LAB — PROTIME-INR
INR: 1.05
Prothrombin Time: 13.6 seconds (ref 11.4–15.2)

## 2018-09-30 LAB — ABO/RH: ABO/RH(D): O POS

## 2018-09-30 LAB — ETHANOL: Alcohol, Ethyl (B): <10 mg/dL (ref <10)

## 2018-09-30 LAB — PREPARE RBC (CROSSMATCH)

## 2018-09-30 LAB — LACTIC ACID, PLASMA: Lactic Acid, Venous: 3.9 mmol/L (ref 0.5–1.9)

## 2018-09-30 SURGERY — EXTERNAL FIXATION, LOWER EXTREMITY
Anesthesia: General | Site: Ankle | Laterality: Right

## 2018-09-30 MED ORDER — PROPOFOL 10 MG/ML IV BOLUS
INTRAVENOUS | Status: DC | PRN
  Administered 2018-09-30: 110 mg via INTRAVENOUS

## 2018-09-30 MED ORDER — ONDANSETRON HCL 4 MG/2ML IJ SOLN: 4.0000 mg | Freq: Four times a day (QID) | INTRAMUSCULAR | Status: DC | PRN

## 2018-09-30 MED ORDER — ACETAMINOPHEN 10 MG/ML IV SOLN: 1000.0000 mg | Freq: Once | INTRAVENOUS | Status: DC | PRN

## 2018-09-30 MED ORDER — FENTANYL CITRATE (PF) 100 MCG/2ML IJ SOLN
INTRAMUSCULAR | Status: AC
  Filled 2018-09-30: qty 2

## 2018-09-30 MED ORDER — ONDANSETRON HCL 4 MG/2ML IJ SOLN
4.0000 mg | Freq: Four times a day (QID) | INTRAMUSCULAR | Status: DC | PRN
  Administered 2018-10-01 – 2018-10-09 (×3): 4 mg via INTRAVENOUS
  Filled 2018-09-30 (×2): qty 2

## 2018-09-30 MED ORDER — PHENYLEPHRINE HCL 10 MG/ML IJ SOLN
INTRAMUSCULAR | Status: DC | PRN
  Administered 2018-09-30 (×2): 80 ug via INTRAVENOUS

## 2018-09-30 MED ORDER — LIDOCAINE HCL (CARDIAC) PF 100 MG/5ML IV SOSY
PREFILLED_SYRINGE | INTRAVENOUS | Status: DC | PRN
  Administered 2018-09-30: 60 mg via INTRATRACHEAL

## 2018-09-30 MED ORDER — LIDOCAINE 2% (20 MG/ML) 5 ML SYRINGE
INTRAMUSCULAR | Status: AC
  Filled 2018-09-30: qty 5

## 2018-09-30 MED ORDER — IOPAMIDOL (ISOVUE-300) INJECTION 61%
INTRAVENOUS | Status: AC
  Administered 2018-09-30: 20 mL
  Filled 2018-09-30: qty 100

## 2018-09-30 MED ORDER — LIDOCAINE HCL 1 % IJ SOLN
INTRAMUSCULAR | Status: AC | PRN
  Administered 2018-09-30: 10 mL

## 2018-09-30 MED ORDER — MIDAZOLAM HCL 2 MG/2ML IJ SOLN
INTRAMUSCULAR | Status: AC | PRN
  Administered 2018-09-30: 1 mg via INTRAVENOUS

## 2018-09-30 MED ORDER — DOCUSATE SODIUM 100 MG PO CAPS
100.0000 mg | ORAL_CAPSULE | Freq: Two times a day (BID) | ORAL | Status: DC
  Administered 2018-10-01 – 2018-10-14 (×17): 100 mg via ORAL
  Filled 2018-09-30 (×17): qty 1

## 2018-09-30 MED ORDER — HYDROMORPHONE HCL 1 MG/ML IJ SOLN
INTRAMUSCULAR | Status: AC
  Filled 2018-09-30: qty 1

## 2018-09-30 MED ORDER — CEFAZOLIN SODIUM-DEXTROSE 1-4 GM/50ML-% IV SOLN
1.0000 g | Freq: Once | INTRAVENOUS | Status: AC
  Administered 2018-09-30: 1 g via INTRAVENOUS
  Filled 2018-09-30: qty 50

## 2018-09-30 MED ORDER — SODIUM CHLORIDE 0.9 % IV SOLN: 10.0000 mL/h | Freq: Once | INTRAVENOUS | Status: DC

## 2018-09-30 MED ORDER — GELATIN ABSORBABLE 12-7 MM EX MISC
CUTANEOUS | Status: AC
  Filled 2018-09-30: qty 1

## 2018-09-30 MED ORDER — PROPOFOL 10 MG/ML IV BOLUS
INTRAVENOUS | Status: AC
  Filled 2018-09-30: qty 20

## 2018-09-30 MED ORDER — FENTANYL CITRATE (PF) 100 MCG/2ML IJ SOLN
INTRAMUSCULAR | Status: AC | PRN
  Administered 2018-09-30: 25 ug via INTRAVENOUS
  Administered 2018-09-30: 50 ug via INTRAVENOUS

## 2018-09-30 MED ORDER — MIDAZOLAM HCL 2 MG/2ML IJ SOLN
INTRAMUSCULAR | Status: AC | PRN
  Administered 2018-09-30 (×3): 0.5 mg via INTRAVENOUS

## 2018-09-30 MED ORDER — METHOCARBAMOL 1000 MG/10ML IJ SOLN
1000.0000 mg | Freq: Three times a day (TID) | INTRAVENOUS | Status: DC | PRN
  Administered 2018-10-01 (×2): 1000 mg via INTRAVENOUS
  Filled 2018-09-30 (×3): qty 10

## 2018-09-30 MED ORDER — HYDROMORPHONE HCL 1 MG/ML IJ SOLN
0.2500 mg | INTRAMUSCULAR | Status: DC | PRN
  Administered 2018-09-30 (×2): 0.5 mg via INTRAVENOUS

## 2018-09-30 MED ORDER — 0.9 % SODIUM CHLORIDE (POUR BTL) OPTIME
TOPICAL | Status: DC | PRN
  Administered 2018-09-30: 1000 mL

## 2018-09-30 MED ORDER — IOHEXOL 300 MG/ML  SOLN
100.0000 mL | Freq: Once | INTRAMUSCULAR | Status: AC | PRN
  Administered 2018-09-30: 100 mL via INTRAVENOUS

## 2018-09-30 MED ORDER — FENTANYL CITRATE (PF) 100 MCG/2ML IJ SOLN
INTRAMUSCULAR | Status: AC
  Administered 2018-09-30: 100 ug via INTRAVENOUS
  Filled 2018-09-30: qty 2

## 2018-09-30 MED ORDER — ACETAMINOPHEN 500 MG PO TABS: 1000.0000 mg | ORAL_TABLET | Freq: Once | ORAL | Status: DC | PRN

## 2018-09-30 MED ORDER — HYDRALAZINE HCL 20 MG/ML IJ SOLN
10.0000 mg | INTRAMUSCULAR | Status: DC | PRN
  Filled 2018-09-30: qty 1

## 2018-09-30 MED ORDER — SODIUM CHLORIDE 0.9 % IV SOLN
INTRAVENOUS | Status: AC | PRN
  Administered 2018-09-30: 1000 mL via INTRAVENOUS

## 2018-09-30 MED ORDER — SUCCINYLCHOLINE CHLORIDE 200 MG/10ML IV SOSY
PREFILLED_SYRINGE | INTRAVENOUS | Status: AC
  Filled 2018-09-30: qty 10

## 2018-09-30 MED ORDER — CEFAZOLIN SODIUM-DEXTROSE 2-4 GM/100ML-% IV SOLN
INTRAVENOUS | Status: AC
  Filled 2018-09-30: qty 100

## 2018-09-30 MED ORDER — SODIUM CHLORIDE 0.9 % IV SOLN
10.0000 mL/h | Freq: Once | INTRAVENOUS | Status: AC
  Administered 2018-10-03 (×2): via INTRAVENOUS

## 2018-09-30 MED ORDER — METOCLOPRAMIDE HCL 5 MG PO TABS
5.0000 mg | ORAL_TABLET | Freq: Three times a day (TID) | ORAL | Status: DC | PRN
  Filled 2018-09-30: qty 2

## 2018-09-30 MED ORDER — PANTOPRAZOLE SODIUM 40 MG PO TBEC
40.0000 mg | DELAYED_RELEASE_TABLET | Freq: Every day | ORAL | Status: DC
  Administered 2018-10-01 – 2018-10-14 (×11): 40 mg via ORAL
  Filled 2018-09-30 (×11): qty 1

## 2018-09-30 MED ORDER — CEFAZOLIN SODIUM-DEXTROSE 2-4 GM/100ML-% IV SOLN
2.0000 g | Freq: Once | INTRAVENOUS | Status: AC
  Administered 2018-09-30: 2 g via INTRAVENOUS

## 2018-09-30 MED ORDER — PANTOPRAZOLE SODIUM 40 MG IV SOLR: 40.0000 mg | Freq: Every day | INTRAVENOUS | Status: DC

## 2018-09-30 MED ORDER — FENTANYL CITRATE (PF) 100 MCG/2ML IJ SOLN
INTRAMUSCULAR | Status: AC | PRN
  Administered 2018-09-30: 12.5 ug via INTRAVENOUS

## 2018-09-30 MED ORDER — ONDANSETRON HCL 4 MG PO TABS: 4.0000 mg | ORAL_TABLET | Freq: Four times a day (QID) | ORAL | Status: DC | PRN

## 2018-09-30 MED ORDER — TETANUS-DIPHTH-ACELL PERTUSSIS 5-2.5-18.5 LF-MCG/0.5 IM SUSP
0.5000 mL | Freq: Once | INTRAMUSCULAR | Status: AC
  Administered 2018-09-30: 0.5 mL via INTRAMUSCULAR

## 2018-09-30 MED ORDER — MIDAZOLAM HCL 2 MG/2ML IJ SOLN
INTRAMUSCULAR | Status: AC
  Filled 2018-09-30: qty 2

## 2018-09-30 MED ORDER — CHLORHEXIDINE GLUCONATE 4 % EX LIQD
60.0000 mL | Freq: Once | CUTANEOUS | Status: DC
  Filled 2018-09-30: qty 60

## 2018-09-30 MED ORDER — FENTANYL CITRATE (PF) 100 MCG/2ML IJ SOLN
50.0000 ug | INTRAMUSCULAR | Status: DC | PRN
  Administered 2018-09-30 – 2018-10-02 (×12): 100 ug via INTRAVENOUS
  Administered 2018-10-02 – 2018-10-04 (×3): 50 ug via INTRAVENOUS
  Administered 2018-10-05 (×2): 100 ug via INTRAVENOUS
  Administered 2018-10-05: 50 ug via INTRAVENOUS
  Administered 2018-10-06 – 2018-10-11 (×7): 100 ug via INTRAVENOUS
  Filled 2018-09-30 (×24): qty 2

## 2018-09-30 MED ORDER — LACTATED RINGERS IV SOLN
INTRAVENOUS | Status: DC | PRN
  Administered 2018-09-30: 19:00:00 via INTRAVENOUS

## 2018-09-30 MED ORDER — ACETAMINOPHEN 160 MG/5ML PO SOLN: 1000.0000 mg | Freq: Once | ORAL | Status: DC | PRN

## 2018-09-30 MED ORDER — SODIUM CHLORIDE 0.9 % IV SOLN
INTRAVENOUS | Status: AC | PRN
  Administered 2018-09-30 (×2): 1000 mL via INTRAVENOUS

## 2018-09-30 MED ORDER — POVIDONE-IODINE 10 % EX SWAB: 2.0000 "application " | Freq: Once | CUTANEOUS | Status: DC

## 2018-09-30 MED ORDER — POTASSIUM CHLORIDE IN NACL 20-0.9 MEQ/L-% IV SOLN
INTRAVENOUS | Status: DC
  Administered 2018-09-30: 22:00:00 via INTRAVENOUS
  Filled 2018-09-30: qty 1000

## 2018-09-30 MED ORDER — IOPAMIDOL (ISOVUE-300) INJECTION 61%
INTRAVENOUS | Status: AC
  Administered 2018-09-30: 100 mL
  Filled 2018-09-30: qty 200

## 2018-09-30 MED ORDER — OXYCODONE HCL 5 MG PO TABS: 5.0000 mg | ORAL_TABLET | Freq: Once | ORAL | Status: DC | PRN

## 2018-09-30 MED ORDER — FENTANYL CITRATE (PF) 250 MCG/5ML IJ SOLN
INTRAMUSCULAR | Status: AC
  Filled 2018-09-30: qty 5

## 2018-09-30 MED ORDER — SODIUM CHLORIDE 0.9 % IV BOLUS: 1000.0000 mL | Freq: Once | INTRAVENOUS | Status: DC

## 2018-09-30 MED ORDER — CEFAZOLIN SODIUM-DEXTROSE 2-4 GM/100ML-% IV SOLN: 2.0000 g | INTRAVENOUS | Status: DC

## 2018-09-30 MED ORDER — LIDOCAINE HCL 1 % IJ SOLN
INTRAMUSCULAR | Status: AC
  Filled 2018-09-30: qty 20

## 2018-09-30 MED ORDER — ONDANSETRON 4 MG PO TBDP: 4.0000 mg | ORAL_TABLET | Freq: Four times a day (QID) | ORAL | Status: DC | PRN

## 2018-09-30 MED ORDER — MORPHINE SULFATE (PF) 4 MG/ML IV SOLN
INTRAVENOUS | Status: AC
  Administered 2018-09-30: 4 mg
  Filled 2018-09-30: qty 1

## 2018-09-30 MED ORDER — SUCCINYLCHOLINE CHLORIDE 20 MG/ML IJ SOLN
INTRAMUSCULAR | Status: DC | PRN
  Administered 2018-09-30: 80 mg via INTRAVENOUS

## 2018-09-30 MED ORDER — SODIUM CHLORIDE 0.9 % IV SOLN
INTRAVENOUS | Status: DC | PRN
  Administered 2018-09-30: 20:00:00 via INTRAVENOUS

## 2018-09-30 MED ORDER — FENTANYL CITRATE (PF) 250 MCG/5ML IJ SOLN
INTRAMUSCULAR | Status: DC | PRN
  Administered 2018-09-30: 50 ug via INTRAVENOUS
  Administered 2018-09-30: 100 ug via INTRAVENOUS

## 2018-09-30 MED ORDER — METOCLOPRAMIDE HCL 5 MG/ML IJ SOLN
5.0000 mg | Freq: Three times a day (TID) | INTRAMUSCULAR | Status: DC | PRN
  Administered 2018-10-01: 10 mg via INTRAVENOUS
  Filled 2018-09-30: qty 2

## 2018-09-30 MED ORDER — FENTANYL CITRATE (PF) 100 MCG/2ML IJ SOLN
INTRAMUSCULAR | Status: AC | PRN
  Administered 2018-09-30: 50 ug via INTRAVENOUS

## 2018-09-30 MED ORDER — OXYCODONE HCL 5 MG/5ML PO SOLN: 5.0000 mg | Freq: Once | ORAL | Status: DC | PRN

## 2018-09-30 SURGICAL SUPPLY — 50 items
ALCOHOL 70% 16 OZ (MISCELLANEOUS) ×4 IMPLANT
BANDAGE ACE 4X5 VEL STRL LF (GAUZE/BANDAGES/DRESSINGS) ×4 IMPLANT
BANDAGE ACE 6X5 VEL STRL LF (GAUZE/BANDAGES/DRESSINGS) ×4 IMPLANT
BNDG COHESIVE 6X5 TAN STRL LF (GAUZE/BANDAGES/DRESSINGS) ×4 IMPLANT
BNDG GAUZE ELAST 4 BULKY (GAUZE/BANDAGES/DRESSINGS) ×8 IMPLANT
CLAMP LG COMBINATION (Clamp) ×3 IMPLANT
CLAMP LG MULTI PIN (Clamp) ×3 IMPLANT
CLAMP ROD ATTACHMENT (Clamp) ×3 IMPLANT
COVER SURGICAL LIGHT HANDLE (MISCELLANEOUS) ×4 IMPLANT
COVER WAND RF STERILE (DRAPES) ×4 IMPLANT
DRAPE C-ARM 42X72 X-RAY (DRAPES) IMPLANT
DRAPE C-ARMOR (DRAPES) ×4 IMPLANT
DRAPE U-SHAPE 47X51 STRL (DRAPES) ×4 IMPLANT
ELECT REM PT RETURN 9FT ADLT (ELECTROSURGICAL) ×4
ELECTRODE REM PT RTRN 9FT ADLT (ELECTROSURGICAL) ×2 IMPLANT
GAUZE SPONGE 4X4 12PLY STRL (GAUZE/BANDAGES/DRESSINGS) ×4 IMPLANT
GAUZE XEROFORM 1X8 LF (GAUZE/BANDAGES/DRESSINGS) ×3 IMPLANT
GAUZE XEROFORM 5X9 LF (GAUZE/BANDAGES/DRESSINGS) ×4 IMPLANT
GLOVE BIO SURGEON STRL SZ7.5 (GLOVE) ×4 IMPLANT
GLOVE BIOGEL PI IND STRL 8 (GLOVE) ×2 IMPLANT
GLOVE BIOGEL PI INDICATOR 8 (GLOVE) ×2
GOWN STRL REUS W/ TWL LRG LVL3 (GOWN DISPOSABLE) ×4 IMPLANT
GOWN STRL REUS W/ TWL XL LVL3 (GOWN DISPOSABLE) ×2 IMPLANT
GOWN STRL REUS W/TWL LRG LVL3 (GOWN DISPOSABLE) ×8
GOWN STRL REUS W/TWL XL LVL3 (GOWN DISPOSABLE) ×4
HANDPIECE INTERPULSE COAX TIP (DISPOSABLE)
KIT BASIN OR (CUSTOM PROCEDURE TRAY) ×4 IMPLANT
KIT TURNOVER KIT B (KITS) ×4 IMPLANT
NEEDLE 22X1 1/2 (OR ONLY) (NEEDLE) IMPLANT
NS IRRIG 1000ML POUR BTL (IV SOLUTION) ×4 IMPLANT
PACK ORTHO EXTREMITY (CUSTOM PROCEDURE TRAY) ×4 IMPLANT
PAD ARMBOARD 7.5X6 YLW CONV (MISCELLANEOUS) ×8 IMPLANT
PADDING CAST COTTON 6X4 STRL (CAST SUPPLIES) ×12 IMPLANT
PIN SHANZ TRANSFIX 6.0X225 (PIN) ×3 IMPLANT
ROD CARBON FIBER 450MM (Rod) ×3 IMPLANT
ROD CRBN FBR LRG EX-FX 11X400 (Rod) ×3 IMPLANT
SCREW SCHNZ SD 5.0 60 THRD/150 (Screw) ×2 IMPLANT
SCRW SCHANZ SD 5.0 60 THRD/150 (Screw) ×8 IMPLANT
SET HNDPC FAN SPRY TIP SCT (DISPOSABLE) IMPLANT
SPONGE LAP 18X18 X RAY DECT (DISPOSABLE) ×4 IMPLANT
STAPLER VISISTAT 35W (STAPLE) IMPLANT
STOCKINETTE IMPERVIOUS LG (DRAPES) ×4 IMPLANT
SYR CONTROL 10ML LL (SYRINGE) IMPLANT
TOWEL OR 17X24 6PK STRL BLUE (TOWEL DISPOSABLE) ×8 IMPLANT
TOWEL OR 17X26 10 PK STRL BLUE (TOWEL DISPOSABLE) ×8 IMPLANT
TUBE CONNECTING 12'X1/4 (SUCTIONS) ×1
TUBE CONNECTING 12X1/4 (SUCTIONS) ×3 IMPLANT
UNDERPAD 30X30 (UNDERPADS AND DIAPERS) ×4 IMPLANT
WATER STERILE IRR 1000ML POUR (IV SOLUTION) ×8 IMPLANT
YANKAUER SUCT BULB TIP NO VENT (SUCTIONS) ×4 IMPLANT
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
## 2018-09-30 NOTE — Progress Notes (Addendum)
Betadine not placed in nose due to blood in nose. CHG bath/ wipes not completed due to urgent nature of procedure and patient discomfort. Two rings and one earring given to daughter at bedside.

## 2018-09-30 NOTE — Sedation Documentation (Signed)
Pt resting complains of pain

## 2018-09-30 NOTE — Transfer of Care (Signed)
Immediate Anesthesia Transfer of Care Note  Patient: Kyle Mcconnell  Procedure(s) Performed: EXTERNAL FIXATION LEG (Right Ankle)  Patient Location: PACU  Anesthesia Type:General  Level of Consciousness: awake  Airway & Oxygen Therapy: Patient Spontanous Breathing and Patient connected to face mask oxygen  Post-op Assessment: Report given to RN and Post -op Vital signs reviewed and stable  Post vital signs: Reviewed and stable  Last Vitals:  Vitals Value Taken Time  BP 143/82 09/30/2018  8:38 PM  Temp    Pulse 100 09/30/2018  8:40 PM  Resp 13 09/30/2018  8:40 PM  SpO2 94 % 09/30/2018  8:40 PM  Vitals shown include unvalidated device data.  Last Pain:  Vitals:   09/30/18 1740  PainSc: 10-Worst pain ever      Patients Stated Pain Goal: 2 (09/30/18 1736)  Complications: No apparent anesthesia complications

## 2018-09-30 NOTE — Brief Op Note (Signed)
09/30/2018  8:37 PM  PATIENT:  Birdena Jubilee  63 y.o. male  PRE-OPERATIVE DIAGNOSIS:  Right ankle fracture  POST-OPERATIVE DIAGNOSIS:  Right ankle fracture  PROCEDURE:  Procedure(s): EXTERNAL FIXATION LEG (Right)  SURGEON:  Surgeon(s) and Role:    * Yolonda Kida, MD - Primary  PHYSICIAN ASSISTANT:   ASSISTANTS: none   ANESTHESIA:   general  EBL:  5 mL   BLOOD ADMINISTERED: 1 unit CC PRBC  DRAINS: none   LOCAL MEDICATIONS USED:  NONE  SPECIMEN:  No Specimen  DISPOSITION OF SPECIMEN:  N/A  COUNTS:  YES  TOURNIQUET:  * No tourniquets in log *  DICTATION: .Note written in EPIC  PLAN OF CARE: Admit to inpatient   PATIENT DISPOSITION:  PACU - hemodynamically stable.   Delay start of Pharmacological VTE agent (>24hrs) due to surgical blood loss or risk of bleeding: not applicable
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
## 2018-09-30 NOTE — Op Note (Addendum)
Date of Surgery: 09/30/2018  INDICATIONS: Mr. Hamacher is a 63 y.o.-year-old male who sustained a right closed pilon Fracture; he was indicated for external fixation due to the displaced and unstable nature of the fracture and came to the operating room today for this procedure. The patient and And his daughter due to his previous conscious sedation performed the vascular dimensional radiology suite. did consent to the procedure after discussion of the risks and benefits.   Of note prior to his trip to the operating room with myself he did develop a pulseless right foot.  This was confirmed with inability to Doppler a dorsalis pedis or posterior tibialis artery.  He had a angiography performed emergently of the right lower extremity which did not indicate any obvious vascular injury proximal to the injury.  There was sluggish blood flow noted in the distal calf.  Also of note, his preoperative exam was inconsistent with compartment syndrome.  He had active motor intact and no pain with passive range of motion, furthermore he had very soft compartments with no swelling or increased compressibility of the calf or lower leg muscle compartments.  PREOPERATIVE DIAGNOSIS: right Pilon fracture   POSTOPERATIVE DIAGNOSIS: Same.  PROCEDURE: External fixation right pilon fracture CPT 20692 multiplane Closed rx fx w/manip:  pilon [id number]  SURGEON: Maryan Rued, M.D.  ANESTHESIA: general  IV FLUIDS AND URINE: See anesthesia.  ESTIMATED BLOOD LOSS: 5 mL.  IMPLANTS: Synthes large external fixator  DRAINS: None.  COMPLICATIONS: None.  DESCRIPTION OF PROCEDURE: The patient was identified in the preoperative holding area.  The operative site was marked by the surgeon and confirmed by the patient.  He was brought back to the operating room.  Anesthesia was induced by the anesthesia team.  No tourniquet was utilized.. The operative extremity was prepped and draped in standard sterile fashion.  A timeout was  performed.  Preoperative antibiotics were given.  The bony landmarks were palpated and the pin sites were marked on the skin.  Each Schanz pin was placed in the same fashion -- first drilling with the 3.5 mm drill while copiously irrigating, then hand placing the pin.  This was confirmed on x-ray on both views.  The ex-fix clamps were placed onto pins and the fracture was pulled into the proper alignment.  The clamps were completely tightened.   Final x-rays were taken in AP and lateral views to confirm the reduction and pin lengths. The wounds were cleaned and dried a final time and a sterile dressing consisting of Xeroform.  Of note, the patient's compartments remained soft throughout the procedure.  The patient was then transferred back to the bed and left the operating room in stable condition.  All sponge and instrument counts were correct.  We did once again attempt to Doppler pulses at the conclusion of the case.  We were unable to do so.  This was however, in fact consistent with his preoperative exam.  The foot did have capillary refill although around 3 seconds.  POSTOPERATIVE PLAN: Mr. Reinwald will remain non weight bearing with the leg elevated.  he will return to the operating room for definitive fixation when the swelling has gone down, with the orthopedic trauma service.  Mr. Soriano will receive DVT prophylaxis based on other medications, activity level, and risk ratio of bleeding to thrombosis.  Pin site care will be initiated on postoperative day one.  We will continue to monitor his compartments closely as well as his vascular exam over the course of  the evening and tomorrow morning.   Maryan Rued, MD [facility] [phone]

## 2018-09-30 NOTE — ED Notes (Signed)
1st unit od emergency release  Blood

## 2018-09-30 NOTE — Anesthesia Procedure Notes (Signed)
Procedure Name: Intubation Date/Time: 09/30/2018 7:54 PM Performed by: Claudina Lick, CRNA Pre-anesthesia Checklist: Patient identified, Emergency Drugs available, Suction available, Patient being monitored and Timeout performed Patient Re-evaluated:Patient Re-evaluated prior to induction Oxygen Delivery Method: Circle system utilized Preoxygenation: Pre-oxygenation with 100% oxygen Induction Type: Rapid sequence and Cricoid Pressure applied Laryngoscope Size: Glidescope, Miller and 2 (1st DL with Hyacinth Meeker 2- no visualization and no attempt at placing ETT. Second attempt with Glidescope- easy placement of ETT.) Grade View: Grade I Tube type: Oral Tube size: 7.5 mm Number of attempts: 1 Airway Equipment and Method: Stylet and Video-laryngoscopy Placement Confirmation: ETT inserted through vocal cords under direct vision,  positive ETCO2 and breath sounds checked- equal and bilateral Secured at: 22 cm Tube secured with: Tape Dental Injury: Teeth and Oropharynx as per pre-operative assessment
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
## 2018-09-30 NOTE — Progress Notes (Signed)
Dr. Aundria Rud came by to see patient and to check on his RLE pulses. RLE still didn't have any doppler signal and Dr. Aundria Rud is aware of this. Only suggestion was to keep RLE warm and to keep checking for a signal. This will be passed along in report to the floor RN.
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
## 2018-09-30 NOTE — ED Notes (Signed)
2nd unit of blood given [id number] and 2nd unit of plasma [id number]

## 2018-09-30 NOTE — ED Notes (Signed)
Report given to IR and [employer], retail , pt transported to [facility] for procedure
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
## 2018-09-30 NOTE — ED Notes (Signed)
One unit ffp given
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
## 2018-09-30 NOTE — Anesthesia Preprocedure Evaluation (Signed)
Anesthesia Evaluation  Patient identified by MRN, date of birth, ID band Patient awake    Reviewed: Allergy & Precautions, NPO status , Patient's Chart, lab work & pertinent test results  History of Anesthesia Complications Negative for: history of anesthetic complications  Airway Mallampati: IV  TM Distance: >3 FB Neck ROM: Full   Comment: Blood stained tongue  Dental  (+) Teeth Intact   Pulmonary    breath sounds clear to auscultation       Cardiovascular hypertension, (-) angina(-) Past MI and (-) CHF  Rhythm:Regular Rate:Tachycardia     Neuro/Psych    GI/Hepatic Neg liver ROS, GERD  Medicated,  Endo/Other  diabetes  Renal/GU Renal InsufficiencyRenal disease     Musculoskeletal   Abdominal   Peds  Hematology   Anesthesia Other Findings   Reproductive/Obstetrics                             Anesthesia Physical Anesthesia Plan  ASA: III and emergent  Anesthesia Plan: General   Post-op Pain Management:    Induction: Intravenous, Rapid sequence and Cricoid pressure planned  PONV Risk Score and Plan: 2 and Ondansetron and Dexamethasone  Airway Management Planned: Oral ETT  Additional Equipment: None  Intra-op Plan:   Post-operative Plan: Extubation in OR and Possible Post-op intubation/ventilation  Informed Consent: I have reviewed the patients History and Physical, chart, labs and discussed the procedure including the risks, benefits and alternatives for the proposed anesthesia with the patient or authorized representative who has indicated his/her understanding and acceptance.     Dental advisory given  Plan Discussed with: CRNA and Surgeon  Anesthesia Plan Comments:         Anesthesia Quick Evaluation

## 2018-10-01 ENCOUNTER — Inpatient Hospital Stay (HOSPITAL_COMMUNITY): Payer: No Typology Code available for payment source

## 2018-10-01 ENCOUNTER — Inpatient Hospital Stay (HOSPITAL_COMMUNITY): Payer: No Typology Code available for payment source | Admitting: Critical Care Medicine

## 2018-10-01 ENCOUNTER — Encounter (HOSPITAL_COMMUNITY): Payer: Self-pay | Admitting: Orthopedic Surgery

## 2018-10-01 DIAGNOSIS — S32452A Displaced transverse fracture of left acetabulum, initial encounter for closed fracture: Secondary | ICD-10-CM

## 2018-10-01 DIAGNOSIS — S82871A Displaced pilon fracture of right tibia, initial encounter for closed fracture: Secondary | ICD-10-CM

## 2018-10-01 LAB — BPAM FFP
Blood Product Expiration Date: 202002202359
Blood Product Expiration Date: 202002202359
ISSUE DATE / TIME: 202002171558
ISSUE DATE / TIME: 202002171558
Unit Type and Rh: 6200
Unit Type and Rh: 6200

## 2018-10-01 LAB — CBC
HCT: 36 % — ABNORMAL LOW (ref 39.0–52.0)
HCT: 33.4 % — ABNORMAL LOW (ref 39.0–52.0)
HCT: 31.9 % — ABNORMAL LOW (ref 39.0–52.0)
HCT: 26.3 % — ABNORMAL LOW (ref 39.0–52.0)
Hemoglobin: 11.7 g/dL — ABNORMAL LOW (ref 13.0–17.0)
Hemoglobin: 11.4 g/dL — ABNORMAL LOW (ref 13.0–17.0)
Hemoglobin: 10.2 g/dL — ABNORMAL LOW (ref 13.0–17.0)
Hemoglobin: 8.6 g/dL — ABNORMAL LOW (ref 13.0–17.0)
MCH: 28.7 pg (ref 26.0–34.0)
MCH: 29.8 pg (ref 26.0–34.0)
MCH: 28.6 pg (ref 26.0–34.0)
MCH: 29.9 pg (ref 26.0–34.0)
MCHC: 32.5 g/dL (ref 30.0–36.0)
MCHC: 34.1 g/dL (ref 30.0–36.0)
MCHC: 32 g/dL (ref 30.0–36.0)
MCHC: 32.7 g/dL (ref 30.0–36.0)
MCV: 88.5 fL (ref 80.0–100.0)
MCV: 87.2 fL (ref 80.0–100.0)
MCV: 89.4 fL (ref 80.0–100.0)
MCV: 91.3 fL (ref 80.0–100.0)
Platelets: 87 10*3/uL — ABNORMAL LOW (ref 150–400)
Platelets: 76 10*3/uL — ABNORMAL LOW (ref 150–400)
Platelets: 89 10*3/uL — ABNORMAL LOW (ref 150–400)
Platelets: 73 10*3/uL — ABNORMAL LOW (ref 150–400)
RBC: 4.07 MIL/uL — ABNORMAL LOW (ref 4.22–5.81)
RBC: 3.83 MIL/uL — ABNORMAL LOW (ref 4.22–5.81)
RBC: 3.57 MIL/uL — ABNORMAL LOW (ref 4.22–5.81)
RBC: 2.88 MIL/uL — ABNORMAL LOW (ref 4.22–5.81)
RDW: 15.9 % — ABNORMAL HIGH (ref 11.5–15.5)
RDW: 16.4 % — ABNORMAL HIGH (ref 11.5–15.5)
RDW: 16.6 % — ABNORMAL HIGH (ref 11.5–15.5)
RDW: 16.8 % — ABNORMAL HIGH (ref 11.5–15.5)
WBC: 20.9 10*3/uL — ABNORMAL HIGH (ref 4.0–10.5)
WBC: 18.9 10*3/uL — ABNORMAL HIGH (ref 4.0–10.5)
WBC: 19 10*3/uL — ABNORMAL HIGH (ref 4.0–10.5)
WBC: 14.9 10*3/uL — ABNORMAL HIGH (ref 4.0–10.5)
nRBC: 0.2 % (ref 0.0–0.2)
nRBC: 0.3 % — ABNORMAL HIGH (ref 0.0–0.2)
nRBC: 0.2 % (ref 0.0–0.2)
nRBC: 0.3 % — ABNORMAL HIGH (ref 0.0–0.2)

## 2018-10-01 LAB — BASIC METABOLIC PANEL
ANION GAP: 10 (ref 5–15)
Anion gap: 14 (ref 5–15)
BUN: 23 mg/dL (ref 8–23)
BUN: 31 mg/dL — ABNORMAL HIGH (ref 8–23)
CALCIUM: 6.7 mg/dL — AB (ref 8.9–10.3)
CO2: 17 mmol/L — ABNORMAL LOW (ref 22–32)
CO2: 16 mmol/L — ABNORMAL LOW (ref 22–32)
Calcium: 7.4 mg/dL — ABNORMAL LOW (ref 8.9–10.3)
Chloride: 113 mmol/L — ABNORMAL HIGH (ref 98–111)
Chloride: 111 mmol/L (ref 98–111)
Creatinine, Ser: 2.44 mg/dL — ABNORMAL HIGH (ref 0.61–1.24)
Creatinine, Ser: 3.07 mg/dL — ABNORMAL HIGH (ref 0.61–1.24)
GFR calc Af Amer: 31 mL/min — ABNORMAL LOW (ref >60)
GFR calc Af Amer: 24 mL/min — ABNORMAL LOW (ref >60)
GFR calc non Af Amer: 27 mL/min — ABNORMAL LOW (ref >60)
GFR calc non Af Amer: 20 mL/min — ABNORMAL LOW (ref >60)
Glucose, Bld: 179 mg/dL — ABNORMAL HIGH (ref 70–99)
Glucose, Bld: 171 mg/dL — ABNORMAL HIGH (ref 70–99)
Potassium: 6.8 mmol/L (ref 3.5–5.1)
Potassium: 5.1 mmol/L (ref 3.5–5.1)
Sodium: 140 mmol/L (ref 135–145)
Sodium: 141 mmol/L (ref 135–145)

## 2018-10-01 LAB — PREPARE FRESH FROZEN PLASMA
Unit division: 0
Unit division: 0

## 2018-10-01 LAB — LACTIC ACID, PLASMA: Lactic Acid, Venous: 5.7 mmol/L (ref 0.5–1.9)

## 2018-10-01 LAB — PROTIME-INR
INR: 1.24
Prothrombin Time: 15.5 seconds — ABNORMAL HIGH (ref 11.4–15.2)

## 2018-10-01 LAB — POCT I-STAT 4, (NA,K, GLUC, HGB,HCT)
GLUCOSE: 182 mg/dL — AB (ref 70–99)
HCT: 31 % — ABNORMAL LOW (ref 39.0–52.0)
Hemoglobin: 10.5 g/dL — ABNORMAL LOW (ref 13.0–17.0)
Potassium: 5 mmol/L (ref 3.5–5.1)
Sodium: 142 mmol/L (ref 135–145)

## 2018-10-01 LAB — MRSA PCR SCREENING: MRSA by PCR: NEGATIVE

## 2018-10-01 LAB — PREPARE RBC (CROSSMATCH)

## 2018-10-01 LAB — HIV ANTIBODY (ROUTINE TESTING W REFLEX): HIV Screen 4th Generation wRfx: NONREACTIVE

## 2018-10-01 MED ORDER — ACETAMINOPHEN 160 MG/5ML PO SOLN: 650.0000 mg | Freq: Four times a day (QID) | ORAL | Status: DC

## 2018-10-01 MED ORDER — LORAZEPAM 2 MG/ML IJ SOLN
INTRAMUSCULAR | Status: AC
  Filled 2018-10-01: qty 1

## 2018-10-01 MED ORDER — INSULIN ASPART 100 UNIT/ML IV SOLN
10.0000 [IU] | Freq: Once | INTRAVENOUS | Status: AC
  Administered 2018-10-01: 10 [IU] via INTRAVENOUS

## 2018-10-01 MED ORDER — LIDOCAINE HCL (PF) 1 % IJ SOLN
5.0000 mL | Freq: Once | INTRAMUSCULAR | Status: AC
  Administered 2018-10-01: 5 mL
  Filled 2018-10-01: qty 5

## 2018-10-01 MED ORDER — SODIUM POLYSTYRENE SULFONATE 15 GM/60ML PO SUSP
60.0000 g | Freq: Once | ORAL | Status: AC
  Administered 2018-10-01: 60 g via ORAL
  Filled 2018-10-01: qty 240

## 2018-10-01 MED ORDER — DIAZEPAM 5 MG/ML IJ SOLN: 5.0000 mg | Freq: Once | INTRAMUSCULAR | Status: DC

## 2018-10-01 MED ORDER — OXYCODONE HCL 5 MG PO TABS
5.0000 mg | ORAL_TABLET | ORAL | Status: DC | PRN
  Administered 2018-10-01: 10 mg via ORAL
  Administered 2018-10-02 – 2018-10-05 (×3): 5 mg via ORAL
  Administered 2018-10-05 – 2018-10-08 (×4): 10 mg via ORAL
  Filled 2018-10-01 (×3): qty 1
  Filled 2018-10-01 (×2): qty 2
  Filled 2018-10-01: qty 1
  Filled 2018-10-01: qty 2
  Filled 2018-10-01: qty 1
  Filled 2018-10-01: qty 2

## 2018-10-01 MED ORDER — ACETAMINOPHEN 325 MG PO TABS: 650.0000 mg | ORAL_TABLET | Freq: Once | ORAL | Status: AC

## 2018-10-01 MED ORDER — LORAZEPAM 2 MG/ML IJ SOLN
2.0000 mg | Freq: Once | INTRAMUSCULAR | Status: AC
  Administered 2018-10-01: 2 mg via INTRAVENOUS

## 2018-10-01 MED ORDER — SODIUM CHLORIDE 0.9 % IV SOLN
INTRAVENOUS | Status: DC
  Administered 2018-10-01 – 2018-10-02 (×2): via INTRAVENOUS

## 2018-10-01 MED ORDER — ACETAMINOPHEN 325 MG PO TABS
650.0000 mg | ORAL_TABLET | Freq: Four times a day (QID) | ORAL | Status: DC
  Administered 2018-10-01 – 2018-10-13 (×41): 650 mg via ORAL
  Filled 2018-10-01 (×44): qty 2

## 2018-10-01 MED ORDER — CEFAZOLIN SODIUM-DEXTROSE 2-4 GM/100ML-% IV SOLN
2.0000 g | INTRAVENOUS | Status: DC
  Filled 2018-10-01 (×2): qty 100

## 2018-10-01 MED ORDER — DIPHENHYDRAMINE HCL 25 MG PO CAPS
25.0000 mg | ORAL_CAPSULE | Freq: Once | ORAL | Status: AC
  Administered 2018-10-01: 25 mg via ORAL
  Filled 2018-10-01: qty 1

## 2018-10-01 MED ORDER — DEXTROSE 50 % IV SOLN
1.0000 | Freq: Once | INTRAVENOUS | Status: AC
  Administered 2018-10-01: 50 mL via INTRAVENOUS
  Filled 2018-10-01: qty 50

## 2018-10-01 MED ORDER — SODIUM CHLORIDE 0.9% IV SOLUTION
Freq: Once | INTRAVENOUS | Status: AC
  Administered 2018-10-09: 16:00:00 via INTRAVENOUS

## 2018-10-01 MED ORDER — SIMETHICONE 80 MG PO CHEW
80.0000 mg | CHEWABLE_TABLET | Freq: Four times a day (QID) | ORAL | Status: DC | PRN
  Administered 2018-10-06: 80 mg via ORAL
  Filled 2018-10-01 (×2): qty 1

## 2018-10-01 MED ORDER — FUROSEMIDE 10 MG/ML IJ SOLN
20.0000 mg | Freq: Once | INTRAMUSCULAR | Status: AC
  Administered 2018-10-01: 20 mg via INTRAVENOUS
  Filled 2018-10-01: qty 2

## 2018-10-01 MED ORDER — BACITRACIN ZINC 500 UNIT/GM EX OINT
TOPICAL_OINTMENT | Freq: Two times a day (BID) | CUTANEOUS | Status: DC
  Administered 2018-10-01 (×2): via TOPICAL
  Administered 2018-10-02 – 2018-10-04 (×3): 1 via TOPICAL
  Administered 2018-10-04 – 2018-10-05 (×3): via TOPICAL
  Administered 2018-10-06: 1 via TOPICAL
  Administered 2018-10-06 – 2018-10-07 (×3): via TOPICAL
  Administered 2018-10-08: 31.5556 via TOPICAL
  Administered 2018-10-08: 13:00:00 via TOPICAL
  Administered 2018-10-09 – 2018-10-12 (×7): 31.5556 via TOPICAL
  Administered 2018-10-13: 1 via TOPICAL
  Administered 2018-10-13: 31.5556 via TOPICAL
  Administered 2018-10-14: 10:00:00 via TOPICAL
  Filled 2018-10-01 (×2): qty 28.4

## 2018-10-01 MED ORDER — VITAMIN C 500 MG PO TABS
500.0000 mg | ORAL_TABLET | Freq: Every day | ORAL | Status: DC
  Administered 2018-10-01 – 2018-10-14 (×11): 500 mg via ORAL
  Filled 2018-10-01 (×11): qty 1

## 2018-10-01 MED ORDER — ORAL CARE MOUTH RINSE: 15.0000 mL | Freq: Two times a day (BID) | OROMUCOSAL | Status: DC

## 2018-10-01 MED ORDER — ALBUMIN HUMAN 25 % IV SOLN
25.0000 g | Freq: Once | INTRAVENOUS | Status: AC
  Administered 2018-10-01: 25 g via INTRAVENOUS
  Filled 2018-10-01: qty 100

## 2018-10-01 MED ORDER — PREGABALIN 50 MG PO CAPS
75.0000 mg | ORAL_CAPSULE | Freq: Two times a day (BID) | ORAL | Status: DC
  Administered 2018-10-01 – 2018-10-14 (×24): 75 mg via ORAL
  Filled 2018-10-01 (×25): qty 1

## 2018-10-01 MED ORDER — CALCIUM GLUCONATE-NACL 1-0.675 GM/50ML-% IV SOLN
1.0000 g | Freq: Once | INTRAVENOUS | Status: AC
  Administered 2018-10-01: 1000 mg via INTRAVENOUS
  Filled 2018-10-01: qty 50

## 2018-10-01 NOTE — Procedures (Signed)
   PROCEDURE: LACERATION REPAIR  Performed by Mike Gip, NP-S Consent: Informed consent, after discussion of the risks, benefits, and alternatives to the procedure, was obtained Location: Midline lower lip Length: 1cm Complexity: Simple Description: Simple interrupted Anesthesia: Lidocaine without epi Preparation: The wound was irrigated with sterile water. He was shaved around the laceration. The wound was again irrigated with sterile water. The area was prepped and draped in the usual sterile fashion.  Exploration: The wound was explored and no foreign bodies were found. Procedure: The skin was closed with 6.0 prolene. There was appropriate approximation, In total, 2 sutures were used.  Post-Procedure: Good closure and hemostasis. The patient tolerated the procedure well and there were no complications. Post procedure bacitracin to be applied by RN.  Violeta Gelinas, MD, MPH, FACS Trauma: [phone] General Surgery: [phone]

## 2018-10-01 NOTE — Progress Notes (Signed)
Attempted MRI as ordered. Unable to meet the conditions for scanning with external fixator in place. Must be outside the MRI bore to be able to safely perform MRI. Unable to achieve this for a thoracic spine study. Will notify MD and RN.
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
## 2018-10-01 NOTE — Progress Notes (Signed)
Orthopedic Tech Progress Note Patient Details:  Kyle Mcconnell 1956/03/15 [id number]  Ortho Devices Type of Ortho Device: Ace wrap, Lenora Boys splint Ortho Device/Splint Interventions: Application   Post Interventions Patient Tolerated: Well Instructions Provided: Care of device   Saul Fordyce 10/01/2018, 6:11 PM

## 2018-10-01 NOTE — Anesthesia Postprocedure Evaluation (Addendum)
Anesthesia Post Note  Patient: Kyle Mcconnell  Procedure(s) Performed: EXTERNAL FIXATION LEG (Right Ankle)     Patient location during evaluation: PACU Anesthesia Type: General Level of consciousness: awake and alert Pain management: pain level controlled Vital Signs Assessment: post-procedure vital signs reviewed and stable Respiratory status: spontaneous breathing, nonlabored ventilation, respiratory function stable and patient connected to nasal cannula oxygen Cardiovascular status: stable Postop Assessment: no apparent nausea or vomiting Anesthetic complications: no    Last Vitals:  Vitals:   10/01/18 0600 10/01/18 0700  BP: 106/77 93/75  Pulse: (!) 115 (!) 114  Resp: (!) 22 (!) 23  Temp: (!) 38.1 C (!) 38.1 C  SpO2: 99% 100%    Last Pain:  Vitals:   10/01/18 0231  PainSc: 7                  Teren Zurcher

## 2018-10-01 NOTE — Progress Notes (Signed)
Pt family expressed they would like to make the patient a confidential pt. Security informed and returned with the paperwork. At this time family has gone home. Paperwork will be passed onto day shift RN to complete with family.

## 2018-10-01 NOTE — Progress Notes (Signed)
Orthopedic Tech Progress Note Patient Details:  Kyle Mcconnell [birth year] [id number]  Patient ID: Kyle Mcconnell, male   DOB: [birth year], 63 y.o.   MRN: [id number]   [name] [employer] for [employer] clamshell. 10/01/2018, 8:53 AM

## 2018-10-01 NOTE — Progress Notes (Signed)
Patient ID: Kyle Mcconnell, male   DOB: 07/16/1956, 63 y.o.   MRN: [id number] Minimal back pain  Strength 5/5 LE bilaterally taking into account orthopedic injuries  Brace and mobilize when able.  Need to see upright xray in brace and MRI prior to mobilization

## 2018-10-02 ENCOUNTER — Encounter (HOSPITAL_COMMUNITY): Payer: Self-pay | Admitting: Critical Care Medicine

## 2018-10-02 ENCOUNTER — Encounter (HOSPITAL_COMMUNITY): Admission: EM | Disposition: A | Discharge status: Rehabilitation Facility | Payer: Self-pay | Source: Home / Self Care

## 2018-10-02 LAB — PREPARE FRESH FROZEN PLASMA
Unit division: 0
Unit division: 0

## 2018-10-02 LAB — CBC
HCT: 30.5 % — ABNORMAL LOW (ref 39.0–52.0)
Hemoglobin: 10 g/dL — ABNORMAL LOW (ref 13.0–17.0)
MCH: 29.2 pg (ref 26.0–34.0)
MCHC: 32.8 g/dL (ref 30.0–36.0)
MCV: 88.9 fL (ref 80.0–100.0)
Platelets: 69 10*3/uL — ABNORMAL LOW (ref 150–400)
RBC: 3.43 MIL/uL — ABNORMAL LOW (ref 4.22–5.81)
RDW: 15.5 % (ref 11.5–15.5)
WBC: 11.4 10*3/uL — ABNORMAL HIGH (ref 4.0–10.5)
nRBC: 0.2 % (ref 0.0–0.2)

## 2018-10-02 LAB — BPAM FFP
Blood Product Expiration Date: 202002202359
Blood Product Expiration Date: 202002212359
ISSUE DATE / TIME: 202002171707
ISSUE DATE / TIME: 202002171707
UNIT TYPE AND RH: 5100
Unit Type and Rh: 5100

## 2018-10-02 LAB — BLOOD PRODUCT ORDER (VERBAL) VERIFICATION

## 2018-10-02 LAB — HCV RNA QUANT RFLX ULTRA OR GENOTYP
HCV RNA Qnt(log copy/mL): UNDETERMINED log10 IU/mL
HepC Qn: NOT DETECTED IU/mL

## 2018-10-02 LAB — HEPATITIS PANEL, ACUTE
HEP B S AG: NEGATIVE
Hep A IgM: NEGATIVE
Hep B C IgM: NEGATIVE

## 2018-10-02 LAB — BASIC METABOLIC PANEL
Anion gap: 12 (ref 5–15)
BUN: 45 mg/dL — ABNORMAL HIGH (ref 8–23)
CO2: 17 mmol/L — ABNORMAL LOW (ref 22–32)
Calcium: 7.1 mg/dL — ABNORMAL LOW (ref 8.9–10.3)
Chloride: 109 mmol/L (ref 98–111)
Creatinine, Ser: 4.02 mg/dL — ABNORMAL HIGH (ref 0.61–1.24)
GFR calc Af Amer: 17 mL/min — ABNORMAL LOW (ref >60)
GFR calc non Af Amer: 15 mL/min — ABNORMAL LOW (ref >60)
Glucose, Bld: 149 mg/dL — ABNORMAL HIGH (ref 70–99)
Potassium: 5.4 mmol/L — ABNORMAL HIGH (ref 3.5–5.1)
Sodium: 138 mmol/L (ref 135–145)

## 2018-10-02 LAB — CK: Total CK: 19839 U/L — ABNORMAL HIGH (ref 49–397)

## 2018-10-02 LAB — PHOSPHORUS: Phosphorus: 6 mg/dL — ABNORMAL HIGH (ref 2.5–4.6)

## 2018-10-02 LAB — HEMOGLOBIN A1C
Hgb A1c MFr Bld: 6 % — ABNORMAL HIGH (ref 4.8–5.6)
Mean Plasma Glucose: 126 mg/dL

## 2018-10-02 LAB — LACTIC ACID, PLASMA: Lactic Acid, Venous: 2.7 mmol/L (ref 0.5–1.9)

## 2018-10-02 SURGERY — CLOSED REDUCTION, PELVIS, WITH PERCUTANEOUS FIXATION
Anesthesia: General | Laterality: Right

## 2018-10-02 MED ORDER — INSULIN ASPART 100 UNIT/ML IV SOLN
10.0000 [IU] | Freq: Once | INTRAVENOUS | Status: AC
  Administered 2018-10-02: 10 [IU] via INTRAVENOUS

## 2018-10-02 MED ORDER — MIDAZOLAM HCL 2 MG/2ML IJ SOLN
INTRAMUSCULAR | Status: AC
  Filled 2018-10-02: qty 2

## 2018-10-02 MED ORDER — MUPIROCIN 2 % EX OINT
1.0000 "application " | TOPICAL_OINTMENT | Freq: Two times a day (BID) | CUTANEOUS | Status: DC
  Administered 2018-10-02 – 2018-10-04 (×2): 1 via NASAL
  Filled 2018-10-02: qty 22

## 2018-10-02 MED ORDER — CALCIUM GLUCONATE-NACL 1-0.675 GM/50ML-% IV SOLN
1.0000 g | Freq: Once | INTRAVENOUS | Status: AC
  Administered 2018-10-02: 1000 mg via INTRAVENOUS
  Filled 2018-10-02: qty 50

## 2018-10-02 MED ORDER — CEFAZOLIN SODIUM-DEXTROSE 2-4 GM/100ML-% IV SOLN: 2.0000 g | INTRAVENOUS | Status: DC

## 2018-10-02 MED ORDER — ALPRAZOLAM 0.5 MG PO TABS
2.0000 mg | ORAL_TABLET | Freq: Four times a day (QID) | ORAL | Status: DC
  Administered 2018-10-02 – 2018-10-03 (×3): 2 mg via ORAL
  Filled 2018-10-02 (×3): qty 4

## 2018-10-02 MED ORDER — PROPOFOL 10 MG/ML IV BOLUS
INTRAVENOUS | Status: AC
  Filled 2018-10-02: qty 20

## 2018-10-02 MED ORDER — DEXTROSE 50 % IV SOLN
1.0000 | Freq: Once | INTRAVENOUS | Status: AC
  Administered 2018-10-02: 50 mL via INTRAVENOUS
  Filled 2018-10-02: qty 50

## 2018-10-02 MED ORDER — SODIUM BICARBONATE 8.4 % IV SOLN
INTRAVENOUS | Status: DC
  Administered 2018-10-02 – 2018-10-03 (×3): via INTRAVENOUS
  Filled 2018-10-02 (×5): qty 150

## 2018-10-02 MED ORDER — FENTANYL CITRATE (PF) 250 MCG/5ML IJ SOLN
INTRAMUSCULAR | Status: AC
  Filled 2018-10-02: qty 5

## 2018-10-02 MED ORDER — TEMAZEPAM 15 MG PO CAPS
30.0000 mg | ORAL_CAPSULE | Freq: Every day | ORAL | Status: DC
  Administered 2018-10-02 – 2018-10-13 (×9): 30 mg via ORAL
  Filled 2018-10-02 (×10): qty 2

## 2018-10-02 NOTE — Progress Notes (Signed)
Dr. Janee Morn stated he wants to keep pt's foley catheter for two reasons: 1.  OR tomorrow if labs improve 2.  AKI - creatinine is elevated (4.02)
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
## 2018-10-02 NOTE — Progress Notes (Addendum)
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 procedure. IMPRESSION: 1. Small foci of irregularity involving left internal iliac artery branches concerning for small pseudoaneurysms or foci of bleeding. Anterior division of the left internal iliac artery was successfully embolized with a Gel-Foam slurry. No evidence for active bleeding at the end of procedure. 2. No significant blood flow identified in the distal right calf and ankle. Orthopedic surgery was aware of this finding. Electronically Signed   By: Richarda Overlie M.D.   On: 09/30/2018 19:59  Assessment/Plan: T12 chance fracture. No new recom. Was unable to get MRI yesterday due to exfix of RLE. Will delay until after  surgery.    LOS: 2 days    Tiana Loft [facility] 10/02/2018, 12:07 PM  Agree with above. There is no emergency for the MRI or to mobilize the patient in the brace until his allowed to get out of bed from an orthopedic perspective.

## 2018-10-02 NOTE — Progress Notes (Signed)
Patient with acute kidney injury with elevation of creatinine to over 4 this morning.  Due to this continued elevation of his creatinine I would plan to delay definitive fixation until tomorrow.  Plan for renal consult today.  Discussed with Dr. Janee Morn.  Roby Lofts, MD [facility] [phone] (phone)

## 2018-10-02 NOTE — Progress Notes (Signed)
Patient ID: Kyle Mcconnell, male   DOB: Dec 04, 1955, 63 y.o.   MRN: [id number] Follow up - Trauma Critical Care  Patient Details:    Kyle Mcconnell is an 63 y.o. male.  Lines/tubes : Urethral Catheter  [name] RN  Non-latex 16 Fr. (Active)  Indication for Insertion or Continuance of Catheter Unstable critical patients (first 24-48 hours) 10/01/2018  8:00 PM  Site Assessment Clean;Intact;Dry 10/01/2018  8:00 PM  Catheter Maintenance Bag below level of bladder;Catheter secured;Drainage bag/tubing not touching floor 10/01/2018  8:00 PM  Collection Container Standard drainage bag 10/01/2018  8:00 PM  Securement Method Securing device (Describe) 10/01/2018  8:00 PM  Output (mL) 350 mL 10/02/2018  6:00 AM    Microbiology/Sepsis markers: Results for orders placed or performed during the hospital encounter of 09/30/18  MRSA PCR Screening     Status: None   Collection Time: 09/30/18 10:12 PM  Result Value Ref Range Status   MRSA by PCR NEGATIVE NEGATIVE Final    Comment:        The GeneXpert MRSA Assay (FDA approved for NASAL specimens only), is one component of a comprehensive MRSA colonization surveillance program. It is not intended to diagnose MRSA infection nor to guide or monitor treatment for MRSA infections. Performed at [facility], [address] [place], [place] [address]     Anti-infectives:  Anti-infectives (From admission, onward)   Start     Dose/Rate Route Frequency Ordered Stop   10/02/18 0815  ceFAZolin (ANCEF) IVPB 2g/100 mL premix     2 g 200 mL/hr over 30 Minutes Intravenous To ShortStay Surgical 10/02/18 0644 10/03/18 0815   10/02/18 0600  ceFAZolin (ANCEF) IVPB 2g/100 mL premix  Status:  Discontinued     2 g 200 mL/hr over 30 Minutes Intravenous On call to O.R. 10/01/18 0948 10/02/18 0645   10/01/18 0800  ceFAZolin (ANCEF) IVPB 2g/100 mL premix  Status:  Discontinued     2 g 200 mL/hr over 30 Minutes Intravenous To ShortStay Surgical 09/30/18 2350  10/02/18 0645   09/30/18 1900  ceFAZolin (ANCEF) IVPB 2g/100 mL premix     2 g 200 mL/hr over 30 Minutes Intravenous  Once 09/30/18 1857 09/30/18 1947   09/30/18 1900  ceFAZolin (ANCEF) 2-4 GM/100ML-% IVPB    Note to Pharmacy:  Sabino Niemann   : cabinet override      09/30/18 1900 09/30/18 1947   09/30/18 1545  ceFAZolin (ANCEF) IVPB 1 g/50 mL premix     1 g 100 mL/hr over 30 Minutes Intravenous  Once 09/30/18 1535 09/30/18 1804      Best Practice/Protocols:  VTE Prophylaxis: Mechanical no sedation  Consults: Treatment Team:  Md, Trauma, MD Yolonda Kida, MD Donalee Citrin, MD    Studies:    Events:  Subjective:    Overnight Issues:   Objective:  Vital signs for last 24 hours: Temp:  [99.9 F (37.7 C)-100.9 F (38.3 C)] 99.9 F (37.7 C) (02/19 0700) Pulse Rate:  [105-119] 115 (02/19 0700) Resp:  [14-26] 20 (02/19 0700) BP: (88-157)/(62-143) 123/73 (02/19 0700) SpO2:  [95 %-100 %] 98 % (02/19 0700)  Hemodynamic parameters for last 24 hours:    Intake/Output from previous day: 02/18 0701 - 02/19 0700 In: 2955.1 [I.V.:2060.9; Blood:704; IV Piggyback:190.2] Out: 950 [Urine:950]  Intake/Output this shift: No intake/output data recorded.  Vent settings for last 24 hours:    Physical Exam:  General: no distress Neuro: alert, oriented and decreased sensation plantar R  foot HEENT/Neck: lower lip lac with sutures, facial contusions Resp: clear to auscultation bilaterally CVS: RRR GI: very distended and quiet, no tenderness Extremities: ex fix R ankle, toes warm  Results for orders placed or performed during the hospital encounter of 09/30/18 (from the past 24 hour(s))  CBC     Status: Abnormal   Collection Time: 10/01/18 10:26 AM  Result Value Ref Range   WBC 19.0 (H) 4.0 - 10.5 K/uL   RBC 3.57 (L) 4.22 - 5.81 MIL/uL   Hemoglobin 10.2 (L) 13.0 - 17.0 g/dL   HCT 13.031.9 (L) 86.539.0 - 78.452.0 %   MCV 89.4 80.0 - 100.0 fL   MCH 28.6 26.0 - 34.0 pg   MCHC  32.0 30.0 - 36.0 g/dL   RDW 69.616.6 (H) 29.511.5 - 28.415.5 %   Platelets 89 (L) 150 - 400 K/uL   nRBC 0.2 0.0 - 0.2 %  Lactic acid, plasma     Status: Abnormal   Collection Time: 10/01/18 10:26 AM  Result Value Ref Range   Lactic Acid, Venous 5.7 (HH) 0.5 - 1.9 mmol/L  BLOOD TRANSFUSION REPORT - SCANNED     Status: None   Collection Time: 10/01/18 11:14 AM   Narrative   Ordered by an unspecified provider.  Hepatitis panel, acute     Status: Abnormal   Collection Time: 10/01/18  1:35 PM  Result Value Ref Range   Hepatitis B Surface Ag Negative Negative   HCV Ab >11.0 (H) 0.0 - 0.9 s/co ratio   Hep A IgM Negative Negative   Hep B C IgM Negative Negative  Hemoglobin A1c     Status: Abnormal   Collection Time: 10/01/18  1:35 PM  Result Value Ref Range   Hgb A1c MFr Bld 6.0 (H) 4.8 - 5.6 %   Mean Plasma Glucose 126 mg/dL  CBC     Status: Abnormal   Collection Time: 10/01/18  4:30 PM  Result Value Ref Range   WBC 14.9 (H) 4.0 - 10.5 K/uL   RBC 2.88 (L) 4.22 - 5.81 MIL/uL   Hemoglobin 8.6 (L) 13.0 - 17.0 g/dL   HCT 13.226.3 (L) 44.039.0 - 10.252.0 %   MCV 91.3 80.0 - 100.0 fL   MCH 29.9 26.0 - 34.0 pg   MCHC 32.7 30.0 - 36.0 g/dL   RDW 72.516.8 (H) 36.611.5 - 44.015.5 %   Platelets 73 (L) 150 - 400 K/uL   nRBC 0.3 (H) 0.0 - 0.2 %  Basic metabolic panel     Status: Abnormal   Collection Time: 10/01/18  4:30 PM  Result Value Ref Range   Sodium 141 135 - 145 mmol/L   Potassium 5.1 3.5 - 5.1 mmol/L   Chloride 111 98 - 111 mmol/L   CO2 16 (L) 22 - 32 mmol/L   Glucose, Bld 171 (H) 70 - 99 mg/dL   BUN 31 (H) 8 - 23 mg/dL   Creatinine, Ser 3.473.07 (H) 0.61 - 1.24 mg/dL   Calcium 7.4 (L) 8.9 - 10.3 mg/dL   GFR calc non Af Amer 20 (L) >60 mL/min   GFR calc Af Amer 24 (L) >60 mL/min   Anion gap 14 5 - 15  Prepare RBC     Status: None   Collection Time: 10/01/18  5:46 PM  Result Value Ref Range   Order Confirmation      ORDER PROCESSED BY BLOOD BANK Performed at [facility], [address] [place],  [place] [id number]   CBC  Status: Abnormal   Collection Time: 10/02/18  3:39 AM  Result Value Ref Range   WBC 11.4 (H) 4.0 - 10.5 K/uL   RBC 3.43 (L) 4.22 - 5.81 MIL/uL   Hemoglobin 10.0 (L) 13.0 - 17.0 g/dL   HCT 44.8 (L) 18.5 - 63.1 %   MCV 88.9 80.0 - 100.0 fL   MCH 29.2 26.0 - 34.0 pg   MCHC 32.8 30.0 - 36.0 g/dL   RDW 49.7 02.6 - 37.8 %   Platelets 69 (L) 150 - 400 K/uL   nRBC 0.2 0.0 - 0.2 %  Basic metabolic panel     Status: Abnormal   Collection Time: 10/02/18  3:39 AM  Result Value Ref Range   Sodium 138 135 - 145 mmol/L   Potassium 5.4 (H) 3.5 - 5.1 mmol/L   Chloride 109 98 - 111 mmol/L   CO2 17 (L) 22 - 32 mmol/L   Glucose, Bld 149 (H) 70 - 99 mg/dL   BUN 45 (H) 8 - 23 mg/dL   Creatinine, Ser 5.88 (H) 0.61 - 1.24 mg/dL   Calcium 7.1 (L) 8.9 - 10.3 mg/dL   GFR calc non Af Amer 15 (L) >60 mL/min   GFR calc Af Amer 17 (L) >60 mL/min   Anion gap 12 5 - 15  Lactic acid, plasma     Status: Abnormal   Collection Time: 10/02/18  3:39 AM  Result Value Ref Range   Lactic Acid, Venous 2.7 (HH) 0.5 - 1.9 mmol/L  Provider-confirm verbal Blood Bank order - RBC, FFP, Type & Screen; 2 Units; Order taken: 09/30/2018; 3:50 PM; Level 1 Trauma, Emergency Release 2 units of O positive red cells and 2 units of A plasmas emergency released to the ER @ 1553. All units...     Status: None   Collection Time: 10/02/18  7:39 AM  Result Value Ref Range   Blood product order confirm MD AUTHORIZATION REQUESTED     Assessment & Plan: Present on Admission: . Pelvic fracture (HCC)    LOS: 2 days   Additional comments:I reviewed the patient's new clinical lab test results. Marland Kitchen [facility] Scalp laceration - repaired in ED Lower lip laceration - repaired 2/18 Right ankle fx - S/P ex fix by Dr. Aundria Rud, possible ORIF tomorrow by Dr. Jena Gauss Left acetabulum fx Left inferior pubic ramus fx - possible fixation tomorrow by Dr. Jena Gauss Left internal iliac artery injury - S/P angioembolization by Dr. Lowella Dandy 2/17 T12  Chance fx - MR still P due to ex fix, TLSO per Dr. Wynetta Emery T11 SP fx - per NS ABL anemia - up after TF Thrombocytopenia - consumptive AKI - Cr 4, Renal consult today, continue IVF Hyperglycemia  Concussion - therapies FEN - hyperkalemia - insulin/D50/Ca, ileus - sips VTE - PAS for now, no Lovenox until PLTs over 100k Dispo - ICU, ortho surgery cancelled today due to renal failure. I spoke with Dr. Jena Gauss. Critical Care Total Time*: 97 Minutes  Violeta Gelinas, MD, MPH, [facility] Trauma: [phone] General Surgery: [phone]  10/02/2018  *Care during the described time interval was provided by me. I have reviewed this patient's available data, including medical history, events of note, physical examination and test results as part of my evaluation.

## 2018-10-02 NOTE — Anesthesia Preprocedure Evaluation (Addendum)
Anesthesia Evaluation  Patient identified by MRN, date of birth, ID band Patient awake    Reviewed: Allergy & Precautions, NPO status , Patient's Chart, lab work & pertinent test results  Airway Mallampati: II       Dental no notable dental hx. (+) Teeth Intact   Pulmonary neg pulmonary ROS,    Pulmonary exam normal breath sounds clear to auscultation       Cardiovascular hypertension, Pt. on medications  Rhythm:Regular Rate:Tachycardia     Neuro/Psych PSYCHIATRIC DISORDERS Anxiety Depression negative neurological ROS     GI/Hepatic GERD  Medicated,  Endo/Other    Renal/GU   negative genitourinary   Musculoskeletal   Abdominal (+) + obese,   Peds  Hematology  (+) anemia ,   Anesthesia Other Findings   Reproductive/Obstetrics                            Anesthesia Physical Anesthesia Plan  ASA: II  Anesthesia Plan: General   Post-op Pain Management:    Induction: Intravenous  PONV Risk Score and Plan: 3 and Ondansetron, Dexamethasone and Midazolam  Airway Management Planned: Oral ETT  Additional Equipment:   Intra-op Plan:   Post-operative Plan: Extubation in OR  Informed Consent: I have reviewed the patients History and Physical, chart, labs and discussed the procedure including the risks, benefits and alternatives for the proposed anesthesia with the patient or authorized representative who has indicated his/her understanding and acceptance.     Dental advisory given  Plan Discussed with: CRNA  Anesthesia Plan Comments:        Anesthesia Quick Evaluation

## 2018-10-03 ENCOUNTER — Inpatient Hospital Stay (HOSPITAL_COMMUNITY): Payer: No Typology Code available for payment source | Admitting: Anesthesiology

## 2018-10-03 ENCOUNTER — Inpatient Hospital Stay (HOSPITAL_COMMUNITY): Payer: No Typology Code available for payment source

## 2018-10-03 ENCOUNTER — Encounter (HOSPITAL_COMMUNITY): Admission: EM | Disposition: A | Discharge status: Rehabilitation Facility | Payer: Self-pay | Source: Home / Self Care

## 2018-10-03 HISTORY — PX: ORIF PELVIC FRACTURE WITH PERCUTANEOUS SCREWS: SHX6800

## 2018-10-03 HISTORY — PX: EXTERNAL FIXATION LEG: SHX1549

## 2018-10-03 LAB — POCT I-STAT 4, (NA,K, GLUC, HGB,HCT)
GLUCOSE: 141 mg/dL — AB (ref 70–99)
HCT: 22 % — ABNORMAL LOW (ref 39.0–52.0)
Hemoglobin: 7.5 g/dL — ABNORMAL LOW (ref 13.0–17.0)
Potassium: 3.9 mmol/L (ref 3.5–5.1)
Sodium: 143 mmol/L (ref 135–145)

## 2018-10-03 LAB — BASIC METABOLIC PANEL
Anion gap: 13 (ref 5–15)
BUN: 56 mg/dL — AB (ref 8–23)
CO2: 23 mmol/L (ref 22–32)
Calcium: 6.6 mg/dL — ABNORMAL LOW (ref 8.9–10.3)
Chloride: 106 mmol/L (ref 98–111)
Creatinine, Ser: 2.69 mg/dL — ABNORMAL HIGH (ref 0.61–1.24)
GFR calc Af Amer: 28 mL/min — ABNORMAL LOW (ref >60)
GFR calc non Af Amer: 24 mL/min — ABNORMAL LOW (ref >60)
Glucose, Bld: 150 mg/dL — ABNORMAL HIGH (ref 70–99)
Potassium: 3.9 mmol/L (ref 3.5–5.1)
Sodium: 142 mmol/L (ref 135–145)

## 2018-10-03 LAB — CBC
HCT: 25.3 % — ABNORMAL LOW (ref 39.0–52.0)
Hemoglobin: 7.9 g/dL — ABNORMAL LOW (ref 13.0–17.0)
MCH: 28.5 pg (ref 26.0–34.0)
MCHC: 31.2 g/dL (ref 30.0–36.0)
MCV: 91.3 fL (ref 80.0–100.0)
Platelets: 69 10*3/uL — ABNORMAL LOW (ref 150–400)
RBC: 2.77 MIL/uL — ABNORMAL LOW (ref 4.22–5.81)
RDW: 15.2 % (ref 11.5–15.5)
WBC: 3.8 10*3/uL — ABNORMAL LOW (ref 4.0–10.5)
nRBC: 0 % (ref 0.0–0.2)

## 2018-10-03 LAB — SURGICAL PCR SCREEN
MRSA, PCR: NEGATIVE
Staphylococcus aureus: NEGATIVE

## 2018-10-03 LAB — PREPARE RBC (CROSSMATCH)

## 2018-10-03 LAB — CK: Total CK: 25941 U/L — ABNORMAL HIGH (ref 49–397)

## 2018-10-03 SURGERY — CLOSED REDUCTION, PELVIS, WITH PERCUTANEOUS FIXATION
Anesthesia: General | Site: Pelvis | Laterality: Right

## 2018-10-03 MED ORDER — FENTANYL CITRATE (PF) 250 MCG/5ML IJ SOLN
INTRAMUSCULAR | Status: AC
  Filled 2018-10-03: qty 5

## 2018-10-03 MED ORDER — HYDROMORPHONE HCL 1 MG/ML IJ SOLN: 0.2500 mg | INTRAMUSCULAR | Status: DC | PRN

## 2018-10-03 MED ORDER — LIDOCAINE 2% (20 MG/ML) 5 ML SYRINGE
INTRAMUSCULAR | Status: DC | PRN
  Administered 2018-10-03: 100 mg via INTRAVENOUS

## 2018-10-03 MED ORDER — ONDANSETRON HCL 4 MG/2ML IJ SOLN
INTRAMUSCULAR | Status: DC | PRN
  Administered 2018-10-03: 4 mg via INTRAVENOUS

## 2018-10-03 MED ORDER — CEFAZOLIN SODIUM-DEXTROSE 2-3 GM-%(50ML) IV SOLR
INTRAVENOUS | Status: DC | PRN
  Administered 2018-10-03: 2 g via INTRAVENOUS

## 2018-10-03 MED ORDER — PROMETHAZINE HCL 25 MG/ML IJ SOLN: 6.2500 mg | INTRAMUSCULAR | Status: DC | PRN

## 2018-10-03 MED ORDER — CEFAZOLIN SODIUM-DEXTROSE 2-4 GM/100ML-% IV SOLN
2.0000 g | Freq: Three times a day (TID) | INTRAVENOUS | Status: AC
  Administered 2018-10-03 – 2018-10-04 (×3): 2 g via INTRAVENOUS
  Filled 2018-10-03 (×4): qty 100

## 2018-10-03 MED ORDER — MIDAZOLAM HCL 2 MG/2ML IJ SOLN
INTRAMUSCULAR | Status: AC
  Filled 2018-10-03: qty 2

## 2018-10-03 MED ORDER — NALOXONE HCL 0.4 MG/ML IJ SOLN
INTRAMUSCULAR | Status: AC
  Filled 2018-10-03: qty 1

## 2018-10-03 MED ORDER — ONDANSETRON HCL 4 MG/2ML IJ SOLN
INTRAMUSCULAR | Status: AC
  Filled 2018-10-03: qty 2

## 2018-10-03 MED ORDER — PHENYLEPHRINE 40 MCG/ML (10ML) SYRINGE FOR IV PUSH (FOR BLOOD PRESSURE SUPPORT)
PREFILLED_SYRINGE | INTRAVENOUS | Status: AC
  Filled 2018-10-03: qty 10

## 2018-10-03 MED ORDER — ACETAMINOPHEN 10 MG/ML IV SOLN: 1000.0000 mg | Freq: Once | INTRAVENOUS | Status: DC | PRN

## 2018-10-03 MED ORDER — DEXAMETHASONE SODIUM PHOSPHATE 10 MG/ML IJ SOLN
INTRAMUSCULAR | Status: DC | PRN
  Administered 2018-10-03: 5 mg via INTRAVENOUS

## 2018-10-03 MED ORDER — PROPOFOL 10 MG/ML IV BOLUS
INTRAVENOUS | Status: AC
  Filled 2018-10-03: qty 40

## 2018-10-03 MED ORDER — LIDOCAINE 2% (20 MG/ML) 5 ML SYRINGE
INTRAMUSCULAR | Status: AC
  Filled 2018-10-03: qty 10

## 2018-10-03 MED ORDER — NALOXONE HCL 0.4 MG/ML IJ SOLN
INTRAMUSCULAR | Status: DC | PRN
  Administered 2018-10-03: 40 ug via INTRAVENOUS

## 2018-10-03 MED ORDER — PROPOFOL 10 MG/ML IV BOLUS
INTRAVENOUS | Status: DC | PRN
  Administered 2018-10-03: 100 mg via INTRAVENOUS

## 2018-10-03 MED ORDER — 0.9 % SODIUM CHLORIDE (POUR BTL) OPTIME
TOPICAL | Status: DC | PRN
  Administered 2018-10-03: 1000 mL

## 2018-10-03 MED ORDER — VANCOMYCIN HCL 1000 MG IV SOLR
INTRAVENOUS | Status: AC
  Filled 2018-10-03: qty 1000

## 2018-10-03 MED ORDER — FENTANYL CITRATE (PF) 250 MCG/5ML IJ SOLN
INTRAMUSCULAR | Status: DC | PRN
  Administered 2018-10-03: 150 ug via INTRAVENOUS
  Administered 2018-10-03 (×3): 50 ug via INTRAVENOUS

## 2018-10-03 MED ORDER — MEPERIDINE HCL 50 MG/ML IJ SOLN: 6.2500 mg | INTRAMUSCULAR | Status: DC | PRN

## 2018-10-03 MED ORDER — SODIUM CHLORIDE 0.9 % IV SOLN
INTRAVENOUS | Status: DC | PRN
  Administered 2018-10-03: 25 ug/min via INTRAVENOUS

## 2018-10-03 MED ORDER — SODIUM CHLORIDE 0.9 % IV SOLN
INTRAVENOUS | Status: DC
  Administered 2018-10-03 – 2018-10-04 (×3): via INTRAVENOUS

## 2018-10-03 MED ORDER — CEFAZOLIN SODIUM 1 G IJ SOLR
INTRAMUSCULAR | Status: AC
  Filled 2018-10-03: qty 20

## 2018-10-03 MED ORDER — VANCOMYCIN HCL 1000 MG IV SOLR
INTRAVENOUS | Status: DC | PRN
  Administered 2018-10-03: 1000 mg

## 2018-10-03 MED ORDER — ROCURONIUM BROMIDE 50 MG/5ML IV SOSY
PREFILLED_SYRINGE | INTRAVENOUS | Status: AC
  Filled 2018-10-03: qty 10

## 2018-10-03 MED ORDER — SUGAMMADEX SODIUM 200 MG/2ML IV SOLN
INTRAVENOUS | Status: DC | PRN
  Administered 2018-10-03: 100 mg via INTRAVENOUS
  Administered 2018-10-03: 160 mg via INTRAVENOUS

## 2018-10-03 MED ORDER — ALBUMIN HUMAN 5 % IV SOLN
INTRAVENOUS | Status: DC | PRN
  Administered 2018-10-03 (×2): via INTRAVENOUS

## 2018-10-03 MED ORDER — MIDAZOLAM HCL 2 MG/2ML IJ SOLN
INTRAMUSCULAR | Status: DC | PRN
  Administered 2018-10-03 (×2): 1 mg via INTRAVENOUS

## 2018-10-03 MED ORDER — ROCURONIUM BROMIDE 50 MG/5ML IV SOSY
PREFILLED_SYRINGE | INTRAVENOUS | Status: DC | PRN
  Administered 2018-10-03: 30 mg via INTRAVENOUS
  Administered 2018-10-03: 50 mg via INTRAVENOUS

## 2018-10-03 MED ORDER — DEXAMETHASONE SODIUM PHOSPHATE 10 MG/ML IJ SOLN
INTRAMUSCULAR | Status: AC
  Filled 2018-10-03: qty 1

## 2018-10-03 MED ORDER — NALOXONE HCL 0.4 MG/ML IJ SOLN: INTRAMUSCULAR | Status: DC | PRN

## 2018-10-03 SURGICAL SUPPLY — 73 items
ADH SKN CLS APL DERMABOND .7 (GAUZE/BANDAGES/DRESSINGS) ×2
BANDAGE ACE 4X5 VEL STRL LF (GAUZE/BANDAGES/DRESSINGS) ×4 IMPLANT
BANDAGE ACE 6X5 VEL STRL LF (GAUZE/BANDAGES/DRESSINGS) ×4 IMPLANT
BIT DRILL 2.5X110 QC LCP DISP (BIT) ×2 IMPLANT
BIT DRILL CANN 4.5MM (BIT) IMPLANT
BIT DRILL CANN LRG QC 5X300 (BIT) ×2 IMPLANT
BLADE CLIPPER SURG (BLADE) IMPLANT
BLADE SURG 11 STRL SS (BLADE) ×4 IMPLANT
BNDG COHESIVE 4X5 TAN STRL (GAUZE/BANDAGES/DRESSINGS) ×4 IMPLANT
BNDG GAUZE ELAST 4 BULKY (GAUZE/BANDAGES/DRESSINGS) ×6 IMPLANT
BRUSH SCRUB SURG 4.25 DISP (MISCELLANEOUS) ×8 IMPLANT
CHLORAPREP W/TINT 26ML (MISCELLANEOUS) ×8 IMPLANT
CLAMP COMBI 8.0/11.0 LRG/MED (Clamp) ×4 IMPLANT
CLAMP COMBO MED CLIP-ON SELF (EXFIX) ×6 IMPLANT
CLOSURE WOUND 1/2 X4 (GAUZE/BANDAGES/DRESSINGS)
COVER SURGICAL LIGHT HANDLE (MISCELLANEOUS) ×8 IMPLANT
COVER WAND RF STERILE (DRAPES) ×4 IMPLANT
DERMABOND ADVANCED (GAUZE/BANDAGES/DRESSINGS) ×2
DERMABOND ADVANCED .7 DNX12 (GAUZE/BANDAGES/DRESSINGS) IMPLANT
DRAPE C-ARM 42X72 X-RAY (DRAPES) ×4 IMPLANT
DRAPE C-ARMOR (DRAPES) ×4 IMPLANT
DRAPE IMP U-DRAPE 54X76 (DRAPES) ×8 IMPLANT
DRAPE INCISE IOBAN 66X45 STRL (DRAPES) ×4 IMPLANT
DRAPE ORTHO SPLIT 77X108 STRL (DRAPES) ×8
DRAPE PROXIMA HALF (DRAPES) ×4 IMPLANT
DRAPE SURG 17X23 STRL (DRAPES) ×24 IMPLANT
DRAPE SURG ORHT 6 SPLT 77X108 (DRAPES) ×4 IMPLANT
DRAPE U-SHAPE 47X51 STRL (DRAPES) ×4 IMPLANT
DRAPE UNIVERSAL PACK (DRAPES) ×4 IMPLANT
DRILL BIT CANN 4.5MM (BIT) ×2
DRSG MEPILEX BORDER 4X4 (GAUZE/BANDAGES/DRESSINGS) ×4 IMPLANT
DRSG MEPILEX BORDER 4X8 (GAUZE/BANDAGES/DRESSINGS) IMPLANT
DRSG MEPITEL 4X7.2 (GAUZE/BANDAGES/DRESSINGS) IMPLANT
ELECT REM PT RETURN 9FT ADLT (ELECTROSURGICAL) ×4
ELECTRODE REM PT RTRN 9FT ADLT (ELECTROSURGICAL) ×2 IMPLANT
GAUZE SPONGE 4X4 12PLY STRL (GAUZE/BANDAGES/DRESSINGS) ×4 IMPLANT
GLOVE BIO SURGEON STRL SZ 6.5 (GLOVE) ×9 IMPLANT
GLOVE BIO SURGEON STRL SZ7.5 (GLOVE) ×16 IMPLANT
GLOVE BIO SURGEONS STRL SZ 6.5 (GLOVE) ×3
GLOVE BIOGEL PI IND STRL 6.5 (GLOVE) ×2 IMPLANT
GLOVE BIOGEL PI IND STRL 7.5 (GLOVE) ×2 IMPLANT
GLOVE BIOGEL PI INDICATOR 6.5 (GLOVE) ×2
GLOVE BIOGEL PI INDICATOR 7.5 (GLOVE) ×2
GOWN STRL REUS W/ TWL LRG LVL3 (GOWN DISPOSABLE) ×4 IMPLANT
GOWN STRL REUS W/TWL LRG LVL3 (GOWN DISPOSABLE) ×8
GUIDEWIRE THREADED 2.8MM (WIRE) ×4 IMPLANT
KIT BASIN OR (CUSTOM PROCEDURE TRAY) ×4 IMPLANT
KIT TURNOVER KIT B (KITS) ×4 IMPLANT
MANIFOLD NEPTUNE II (INSTRUMENTS) ×4 IMPLANT
NEEDLE 22X1 1/2 (OR ONLY) (NEEDLE) IMPLANT
NS IRRIG 1000ML POUR BTL (IV SOLUTION) ×4 IMPLANT
PACK ORTHO EXTREMITY (CUSTOM PROCEDURE TRAY) ×4 IMPLANT
PACK TOTAL JOINT (CUSTOM PROCEDURE TRAY) ×4 IMPLANT
PAD ARMBOARD 7.5X6 YLW CONV (MISCELLANEOUS) ×8 IMPLANT
PAD CAST 4YDX4 CTTN HI CHSV (CAST SUPPLIES) IMPLANT
PADDING CAST COTTON 4X4 STRL (CAST SUPPLIES) ×4
PADDING CAST COTTON 6X4 STRL (CAST SUPPLIES) ×8 IMPLANT
ROD CARBON FIBER 8.0X160MM (EXFIX) ×4 IMPLANT
SCREW CANN 16 THRD/130 7.3 (Screw) ×2 IMPLANT
SCREW CANN 32 THRD/130 7.3 (Screw) ×2 IMPLANT
SCREW CANN 7.3X130 (Screw) ×2 IMPLANT
SCREW SHANZ 4.0X100 (EXFIX) ×4 IMPLANT
SPONGE LAP 18X18 RF (DISPOSABLE) ×4 IMPLANT
STAPLER VISISTAT 35W (STAPLE) ×4 IMPLANT
STRIP CLOSURE SKIN 1/2X4 (GAUZE/BANDAGES/DRESSINGS) IMPLANT
SUCTION FRAZIER HANDLE 10FR (MISCELLANEOUS) ×2
SUCTION TUBE FRAZIER 10FR DISP (MISCELLANEOUS) ×2 IMPLANT
SUT MNCRL AB 3-0 PS2 18 (SUTURE) ×4 IMPLANT
SUT MON AB 2-0 CT1 36 (SUTURE) ×4 IMPLANT
SUT PROLENE 0 CT (SUTURE) IMPLANT
TOWEL OR 17X26 10 PK STRL BLUE (TOWEL DISPOSABLE) ×6 IMPLANT
UNDERPAD 30X30 (UNDERPADS AND DIAPERS) ×4 IMPLANT
WATER STERILE IRR 1000ML POUR (IV SOLUTION) ×4 IMPLANT

## 2018-10-03 NOTE — Transfer of Care (Signed)
Immediate Anesthesia Transfer of Care Note  Patient: Kyle Mcconnell  Procedure(s) Performed: ORIF PELVIC FRACTURE WITH PERCUTANEOUS SCREWS (Left Pelvis) EXTERNAL FIXATION LEG (Right Leg Lower)  Patient Location: PACU  Anesthesia Type:General  Level of Consciousness: awake, alert  and sedated  Airway & Oxygen Therapy: Patient connected to face mask oxygen  Post-op Assessment: Post -op Vital signs reviewed and stable  Post vital signs: stable  Last Vitals:  Vitals Value Taken Time  BP 108/84 10/03/2018 12:26 PM  Temp    Pulse 110 10/03/2018 12:30 PM  Resp 22 10/03/2018 12:30 PM  SpO2 93 % 10/03/2018 12:30 PM  Vitals shown include unvalidated device data.  Last Pain:  Vitals:   10/03/18 0400  TempSrc:   PainSc: 0-No pain      Patients Stated Pain Goal: 2 (09/30/18 1736)  Complications: No apparent anesthesia complications

## 2018-10-03 NOTE — Progress Notes (Signed)
Foley will remain in place due to acute kidney injury and strict I&O per renal and trauma.

## 2018-10-03 NOTE — Anesthesia Procedure Notes (Signed)
Procedure Name: Intubation Date/Time: 10/03/2018 9:43 AM Performed by: Kathryne Hitch, CRNA Pre-anesthesia Checklist: Patient identified, Emergency Drugs available, Suction available, Patient being monitored and Timeout performed Patient Re-evaluated:Patient Re-evaluated prior to induction Oxygen Delivery Method: Circle system utilized Preoxygenation: Pre-oxygenation with 100% oxygen Induction Type: IV induction Ventilation: Mask ventilation without difficulty Laryngoscope Size: Miller and 2 Grade View: Grade I Tube type: Oral Tube size: 7.5 mm Number of attempts: 2 (MAC 3 x1 ) Airway Equipment and Method: Stylet Placement Confirmation: ETT inserted through vocal cords under direct vision,  positive ETCO2 and breath sounds checked- equal and bilateral Secured at: 25 cm Tube secured with: Tape Dental Injury: Teeth and Oropharynx as per pre-operative assessment
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
## 2018-10-03 NOTE — Anesthesia Postprocedure Evaluation (Signed)
Anesthesia Post Note  Patient: Kyle Mcconnell  Procedure(s) Performed: ORIF PELVIC FRACTURE WITH PERCUTANEOUS SCREWS (Left Pelvis) EXTERNAL FIXATION LEG (Right Leg Lower)     Patient location during evaluation: PACU Anesthesia Type: General Level of consciousness: awake Pain management: pain level controlled Vital Signs Assessment: post-procedure vital signs reviewed and stable Respiratory status: spontaneous breathing Cardiovascular status: stable Postop Assessment: no apparent nausea or vomiting Anesthetic complications: no    Last Vitals:  Vitals:   10/03/18 1320 10/03/18 1330  BP: 133/85   Pulse: (!) 109 (!) 110  Resp: (!) 23   Temp:    SpO2: 92% 91%    Last Pain:  Vitals:   10/03/18 1330  TempSrc:   PainSc: 0-No pain   Pain Goal: Patients Stated Pain Goal: 2 (09/30/18 1736)  LLE Motor Response: Purposeful movement (10/03/18 1330) LLE Sensation: Full sensation (10/03/18 1330) RLE Motor Response: Purposeful movement (10/03/18 1330) RLE Sensation: Full sensation (10/03/18 1330)        Caren Macadam
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
## 2018-10-03 NOTE — Interval H&P Note (Signed)
History and Physical Interval Note:  10/03/2018 7:24 AM  Kyle Mcconnell  has presented today for surgery, with the diagnosis of left acetabular fracture  The various methods of treatment have been discussed with the patient and family. After consideration of risks, benefits and other options for treatment, the patient has consented to  Procedure(s): ORIF PELVIC FRACTURE WITH PERCUTANEOUS SCREWS (Left) EXTERNAL FIXATION LEG (Left) as a surgical intervention .  The patient's history has been reviewed, patient examined, no change in status, stable for surgery.  I have reviewed the patient's chart and labs.  Questions were answered to the patient's satisfaction.     Caryn Bee P Haddix
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
## 2018-10-04 ENCOUNTER — Inpatient Hospital Stay (HOSPITAL_COMMUNITY): Payer: No Typology Code available for payment source

## 2018-10-04 ENCOUNTER — Encounter (HOSPITAL_COMMUNITY): Payer: Self-pay | Admitting: Student

## 2018-10-04 DIAGNOSIS — I639 Cerebral infarction, unspecified: Secondary | ICD-10-CM

## 2018-10-04 DIAGNOSIS — R29898 Other symptoms and signs involving the musculoskeletal system: Secondary | ICD-10-CM

## 2018-10-04 DIAGNOSIS — I6310 Cerebral infarction due to embolism of unspecified precerebral artery: Secondary | ICD-10-CM

## 2018-10-04 DIAGNOSIS — S32391A Other fracture of right ilium, initial encounter for closed fracture: Secondary | ICD-10-CM

## 2018-10-04 DIAGNOSIS — S22089A Unspecified fracture of T11-T12 vertebra, initial encounter for closed fracture: Secondary | ICD-10-CM

## 2018-10-04 DIAGNOSIS — S32402A Unspecified fracture of left acetabulum, initial encounter for closed fracture: Secondary | ICD-10-CM

## 2018-10-04 DIAGNOSIS — T148XXA Other injury of unspecified body region, initial encounter: Secondary | ICD-10-CM

## 2018-10-04 DIAGNOSIS — I6340 Cerebral infarction due to embolism of unspecified cerebral artery: Secondary | ICD-10-CM

## 2018-10-04 DIAGNOSIS — S82391A Other fracture of lower end of right tibia, initial encounter for closed fracture: Secondary | ICD-10-CM

## 2018-10-04 LAB — TYPE AND SCREEN
ABO/RH(D): O POS
Antibody Screen: NEGATIVE
UNIT DIVISION: 0
UNIT DIVISION: 0
Unit division: 0
Unit division: 0
Unit division: 0
Unit division: 0
Unit division: 0
Unit division: 0

## 2018-10-04 LAB — BPAM RBC
Blood Product Expiration Date: 202003112359
Blood Product Expiration Date: 202003142359
Blood Product Expiration Date: 202003142359
Blood Product Expiration Date: 202003142359
Blood Product Expiration Date: 202003172359
Blood Product Expiration Date: 202003172359
Blood Product Expiration Date: 202003192359
Blood Product Expiration Date: 202003192359
ISSUE DATE / TIME: 202002171555
ISSUE DATE / TIME: 202002171555
ISSUE DATE / TIME: 202002171703
ISSUE DATE / TIME: 202002171703
ISSUE DATE / TIME: 202002181822
ISSUE DATE / TIME: 202002182314
Unit Type and Rh: 5100
Unit Type and Rh: 5100
Unit Type and Rh: 5100
Unit Type and Rh: 5100
Unit Type and Rh: 5100
Unit Type and Rh: 5100
Unit Type and Rh: 5100
Unit Type and Rh: 5100

## 2018-10-04 LAB — RENAL FUNCTION PANEL
Albumin: 3 g/dL — ABNORMAL LOW (ref 3.5–5.0)
Anion gap: 7 (ref 5–15)
BUN: 41 mg/dL — ABNORMAL HIGH (ref 8–23)
CO2: 29 mmol/L (ref 22–32)
Calcium: 6.9 mg/dL — ABNORMAL LOW (ref 8.9–10.3)
Chloride: 109 mmol/L (ref 98–111)
Creatinine, Ser: 1.8 mg/dL — ABNORMAL HIGH (ref 0.61–1.24)
GFR calc Af Amer: 45 mL/min — ABNORMAL LOW (ref >60)
GFR calc non Af Amer: 39 mL/min — ABNORMAL LOW (ref >60)
Glucose, Bld: 141 mg/dL — ABNORMAL HIGH (ref 70–99)
Phosphorus: 3 mg/dL (ref 2.5–4.6)
Potassium: 4 mmol/L (ref 3.5–5.1)
Sodium: 145 mmol/L (ref 135–145)

## 2018-10-04 LAB — CBC
HCT: 22.2 % — ABNORMAL LOW (ref 39.0–52.0)
Hemoglobin: 7.3 g/dL — ABNORMAL LOW (ref 13.0–17.0)
MCH: 29.4 pg (ref 26.0–34.0)
MCHC: 32.9 g/dL (ref 30.0–36.0)
MCV: 89.5 fL (ref 80.0–100.0)
Platelets: 75 10*3/uL — ABNORMAL LOW (ref 150–400)
RBC: 2.48 MIL/uL — ABNORMAL LOW (ref 4.22–5.81)
RDW: 14.9 % (ref 11.5–15.5)
WBC: 5.2 10*3/uL (ref 4.0–10.5)
nRBC: 0 % (ref 0.0–0.2)

## 2018-10-04 LAB — ECHOCARDIOGRAM COMPLETE

## 2018-10-04 LAB — CK: Total CK: 35880 U/L — ABNORMAL HIGH (ref 49–397)

## 2018-10-04 MED ORDER — ASPIRIN EC 325 MG PO TBEC
650.0000 mg | DELAYED_RELEASE_TABLET | Freq: Once | ORAL | Status: AC
  Administered 2018-10-04: 650 mg via ORAL
  Filled 2018-10-04: qty 2

## 2018-10-04 MED ORDER — ALPRAZOLAM 0.5 MG PO TABS
2.0000 mg | ORAL_TABLET | Freq: Two times a day (BID) | ORAL | Status: DC
  Administered 2018-10-04 – 2018-10-14 (×17): 2 mg via ORAL
  Filled 2018-10-04 (×17): qty 4

## 2018-10-04 MED ORDER — LACTATED RINGERS IV SOLN
INTRAVENOUS | Status: DC
  Administered 2018-10-04 – 2018-10-07 (×8): via INTRAVENOUS

## 2018-10-04 MED ORDER — ASPIRIN EC 325 MG PO TBEC
325.0000 mg | DELAYED_RELEASE_TABLET | Freq: Every day | ORAL | Status: DC
  Administered 2018-10-05 – 2018-10-14 (×9): 325 mg via ORAL
  Filled 2018-10-04 (×9): qty 1

## 2018-10-04 NOTE — Progress Notes (Signed)
TCD bubble study completed. Bandon Sherwin, BS, RDMS, RVT  
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
## 2018-10-04 NOTE — Progress Notes (Signed)
Spoke to RN early afternoon 10/04/18. Per RN, pt still has MR unsafe external fixator, unable to go to surgery today. New fixator to be put in, faxed over by Montez Morita, PA is ok for scanning. TBD after fixator is changed.
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
## 2018-10-04 NOTE — Progress Notes (Signed)
Upon early morning neuro exam, I noticed patient having trouble lifting his right arm, and speech was slightly slurred. These were changes from previous neuro exams. Trauma MD on call paged. Orders were received for a STAT head CT. Code stroke was called. Neuro MD on call came to bedside to assess patient.

## 2018-10-04 NOTE — Progress Notes (Signed)
  Echocardiogram 2D Echocardiogram has been performed.  Kyle Mcconnell 10/04/2018, 11:09 AM
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
## 2018-10-04 NOTE — Progress Notes (Signed)
Carotid artery duplex, transcranial dopplers, and bilateral lower extremity venous duplex has been completed. Preliminary results can be found in CV Proc through chart review.   10/04/18 1:49 PM Olen Cordial RVT

## 2018-10-05 ENCOUNTER — Inpatient Hospital Stay (HOSPITAL_COMMUNITY): Payer: No Typology Code available for payment source

## 2018-10-05 ENCOUNTER — Encounter (HOSPITAL_COMMUNITY): Payer: Self-pay | Admitting: Radiology

## 2018-10-05 ENCOUNTER — Inpatient Hospital Stay: Payer: Self-pay

## 2018-10-05 LAB — RENAL FUNCTION PANEL
Albumin: 2.7 g/dL — ABNORMAL LOW (ref 3.5–5.0)
Anion gap: 9 (ref 5–15)
BUN: 22 mg/dL (ref 8–23)
CALCIUM: 7.4 mg/dL — AB (ref 8.9–10.3)
CO2: 28 mmol/L (ref 22–32)
Chloride: 108 mmol/L (ref 98–111)
Creatinine, Ser: 1.2 mg/dL (ref 0.61–1.24)
GFR calc non Af Amer: >60 mL/min (ref >60)
Glucose, Bld: 125 mg/dL — ABNORMAL HIGH (ref 70–99)
Phosphorus: 2.1 mg/dL — ABNORMAL LOW (ref 2.5–4.6)
Potassium: 3.1 mmol/L — ABNORMAL LOW (ref 3.5–5.1)
Sodium: 145 mmol/L (ref 135–145)

## 2018-10-05 LAB — RAPID URINE DRUG SCREEN, HOSP PERFORMED
Amphetamines: NOT DETECTED
Barbiturates: NOT DETECTED
Benzodiazepines: POSITIVE — AB
Cocaine: NOT DETECTED
Opiates: NOT DETECTED
Tetrahydrocannabinol: NOT DETECTED

## 2018-10-05 LAB — LIPID PANEL
Cholesterol: 129 mg/dL (ref 0–200)
HDL: 18 mg/dL — AB (ref >40)
LDL Cholesterol: 49 mg/dL (ref 0–99)
Total CHOL/HDL Ratio: 7.2 RATIO
Triglycerides: 312 mg/dL — ABNORMAL HIGH (ref <150)
VLDL: 62 mg/dL — ABNORMAL HIGH (ref 0–40)

## 2018-10-05 LAB — PREPARE RBC (CROSSMATCH)

## 2018-10-05 LAB — CK: Total CK: 33550 U/L — ABNORMAL HIGH (ref 49–397)

## 2018-10-05 MED ORDER — IOPAMIDOL (ISOVUE-370) INJECTION 76%
75.0000 mL | Freq: Once | INTRAVENOUS | Status: AC | PRN
  Administered 2018-10-05: 100 mL via INTRAVENOUS

## 2018-10-05 MED ORDER — SODIUM CHLORIDE 0.9% FLUSH
10.0000 mL | INTRAVENOUS | Status: DC | PRN
  Administered 2018-10-05 – 2018-10-12 (×2): 10 mL
  Filled 2018-10-05 (×2): qty 40

## 2018-10-05 MED ORDER — SODIUM CHLORIDE 0.9% IV SOLUTION
Freq: Once | INTRAVENOUS | Status: AC
  Administered 2018-10-09: 16:00:00 via INTRAVENOUS

## 2018-10-05 MED ORDER — POTASSIUM CHLORIDE CRYS ER 20 MEQ PO TBCR
40.0000 meq | EXTENDED_RELEASE_TABLET | Freq: Once | ORAL | Status: AC
  Administered 2018-10-05: 40 meq via ORAL
  Filled 2018-10-05: qty 2

## 2018-10-05 MED ORDER — ACETAMINOPHEN 325 MG PO TABS
650.0000 mg | ORAL_TABLET | Freq: Once | ORAL | Status: AC
  Administered 2018-10-05: 650 mg via ORAL

## 2018-10-05 MED ORDER — DIPHENHYDRAMINE HCL 25 MG PO CAPS
25.0000 mg | ORAL_CAPSULE | Freq: Once | ORAL | Status: AC
  Administered 2018-10-05: 25 mg via ORAL
  Filled 2018-10-05: qty 1

## 2018-10-05 MED ORDER — SODIUM CHLORIDE 0.9% FLUSH
10.0000 mL | Freq: Two times a day (BID) | INTRAVENOUS | Status: DC
  Administered 2018-10-05 – 2018-10-14 (×13): 10 mL

## 2018-10-05 MED ORDER — SODIUM CHLORIDE 0.9% IV SOLUTION: Freq: Once | INTRAVENOUS | Status: DC

## 2018-10-05 MED ORDER — ACETAMINOPHEN 325 MG PO TABS
650.0000 mg | ORAL_TABLET | Freq: Once | ORAL | Status: AC
  Administered 2018-10-05: 650 mg via ORAL
  Filled 2018-10-05: qty 2

## 2018-10-05 MED ORDER — SODIUM CHLORIDE 0.9% IV SOLUTION
Freq: Once | INTRAVENOUS | Status: AC
  Administered 2018-10-05: 11:00:00 via INTRAVENOUS

## 2018-10-05 MED ORDER — CHLORHEXIDINE GLUCONATE CLOTH 2 % EX PADS
6.0000 | MEDICATED_PAD | Freq: Every day | CUTANEOUS | Status: DC
  Administered 2018-10-05 – 2018-10-14 (×9): 6 via TOPICAL
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
## 2018-10-05 NOTE — Progress Notes (Signed)
I walked into patient's room to give morning meds, and patient had self adjusted head of bed to approximately 30 degrees. I turned head of bed to flat, reeducated patient on need to keep head of bed flat, and locked out bed controls. Patient was receptive to education. Will continue to monitor.

## 2018-10-05 NOTE — Progress Notes (Signed)
CRITICAL VALUE ALERT  Critical Value:  Hemoglobin 6.9  Date & Time Notied:  10/05/2018 0610  Provider Notified: Dr. Donell Beers  Orders Received/Actions taken: Orders to have 2 units PRBC ready and available to transfuse.

## 2018-10-05 NOTE — Progress Notes (Signed)
Peripherally Inserted Central Catheter/Midline Placement  The IV Nurse has discussed with the patient and/or persons authorized to consent for the patient, the purpose of this procedure and the potential benefits and risks involved with this procedure.  The benefits include less needle sticks, lab draws from the catheter, and the patient may be discharged home with the catheter. Risks include, but not limited to, infection, bleeding, blood clot (thrombus formation), and puncture of an artery; nerve damage and irregular heartbeat and possibility to perform a PICC exchange if needed/ordered by physician.  Alternatives to this procedure were also discussed.  Bard Power PICC patient education guide, fact sheet on infection prevention and patient information card has been provided to patient /or left at bedside.    PICC/Midline Placement Documentation  PICC Double Lumen 10/05/18 PICC Left Brachial 45 cm 0 cm (Active)  Indication for Insertion or Continuance of Line Limited venous access - need for IV therapy >5 days (PICC only) 10/05/2018  6:58 PM  Exposed Catheter (cm) 0 cm 10/05/2018  6:58 PM  Site Assessment Clean;Intact;Dry 10/05/2018  6:58 PM  Lumen #1 Status Flushed;Saline locked;Blood return noted 10/05/2018  6:58 PM  Lumen #2 Status Flushed;Saline locked;Blood return noted 10/05/2018  6:58 PM  Dressing Type Transparent 10/05/2018  6:58 PM  Dressing Status Clean;Dry;Intact;Antimicrobial disc in place 10/05/2018  6:58 PM  Line Care Connections checked and tightened 10/05/2018  6:58 PM  Line Adjustment (NICU/IV Team Only) No 10/05/2018  6:58 PM  Dressing Intervention New dressing 10/05/2018  6:58 PM  Dressing Change Due 10/12/18 10/05/2018  6:58 PM       Elliot Dally 10/05/2018, 6:59 PM
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
## 2018-10-05 NOTE — Progress Notes (Signed)
Neurology ordered MRI for after external fixation removal.  Per ortho, plan for surgery early this week to fix ankle.

## 2018-10-05 NOTE — Progress Notes (Addendum)
Patient ID: BRANSEN CROPPER, male   DOB: [birth year], 63 y.o.   MRN: [id number]     Subjective:  Patient reports pain as mild to moderate.  Patient in bed and alert denies any CP or SOB  Objective:   VITALS:   Vitals:   10/05/18 1200 10/05/18 1230 10/05/18 1300 10/05/18 1323  BP: (!) 156/88  (!) 146/91   Pulse: (!) 105 (!) 101 (!) 104 (!) 106  Resp: 20 (!) 28 (!) 24 (!) 40  Temp: 100.2 F (37.9 C) (!) 100.4 F (38 C) (!) 100.6 F (38.1 C) (!) 100.6 F (38.1 C)  TempSrc: Bladder     SpO2: 94% 95% 97% 93%    ABD soft Sensation intact distally Dorsiflexion/Plantar flexion intact Incision: moderate drainage Okay for dry dressing change.  Lab Results  Component Value Date   WBC 7.3 10/05/2018   HGB 6.9 (LL) 10/05/2018   HCT 20.7 (L) 10/05/2018   MCV 90.4 10/05/2018   PLT 145 (L) 10/05/2018   BMET    Component Value Date/Time   NA 145 10/05/2018 0432   K 3.1 (L) 10/05/2018 0432   CL 108 10/05/2018 0432   CO2 28 10/05/2018 0432   GLUCOSE 125 (H) 10/05/2018 0432   BUN 22 10/05/2018 0432   CREATININE 1.20 10/05/2018 0432   CALCIUM 7.4 (L) 10/05/2018 0432   GFRNONAA >60 10/05/2018 0432   GFRAA >60 10/05/2018 0432     Assessment/Plan: 2 Days Post-Op   Active Problems:   Pelvic fracture (HCC)   Displaced transverse fracture of left acetabulum, initial encounter for closed fracture (HCC)   Closed pilon fracture of right tibia   Cerebral embolism with cerebral infarction   Continue plan per Trauma Continue plan per Neuro ABLA patient has blood ordered and should get two units Dry dressing PRN Continue to maintain ex fix   Nelda Severe 10/05/2018, 1:46 PM  Discussed and agree with above.   Teryl Lucy, MD Cell [phone]
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
## 2018-10-06 ENCOUNTER — Inpatient Hospital Stay (HOSPITAL_COMMUNITY): Payer: No Typology Code available for payment source

## 2018-10-06 DIAGNOSIS — I10 Essential (primary) hypertension: Secondary | ICD-10-CM

## 2018-10-06 DIAGNOSIS — D72829 Elevated white blood cell count, unspecified: Secondary | ICD-10-CM

## 2018-10-06 DIAGNOSIS — R509 Fever, unspecified: Secondary | ICD-10-CM

## 2018-10-06 LAB — CBC
HCT: 27.7 % — ABNORMAL LOW (ref 39.0–52.0)
HEMATOCRIT: 20.7 % — AB (ref 39.0–52.0)
HEMOGLOBIN: 6.9 g/dL — AB (ref 13.0–17.0)
HEMOGLOBIN: 9.2 g/dL — AB (ref 13.0–17.0)
MCH: 30.1 pg (ref 26.0–34.0)
MCH: 29.6 pg (ref 26.0–34.0)
MCHC: 33.3 g/dL (ref 30.0–36.0)
MCHC: 33.2 g/dL (ref 30.0–36.0)
MCV: 90.4 fL (ref 80.0–100.0)
MCV: 89.1 fL (ref 80.0–100.0)
Platelets: 145 10*3/uL — ABNORMAL LOW (ref 150–400)
Platelets: 109 10*3/uL — ABNORMAL LOW (ref 150–400)
RBC: 2.29 MIL/uL — ABNORMAL LOW (ref 4.22–5.81)
RBC: 3.11 MIL/uL — ABNORMAL LOW (ref 4.22–5.81)
RDW: 14.8 % (ref 11.5–15.5)
RDW: 15.4 % (ref 11.5–15.5)
WBC: 7.3 10*3/uL (ref 4.0–10.5)
WBC: 10.6 10*3/uL — ABNORMAL HIGH (ref 4.0–10.5)
nRBC: 0 % (ref 0.0–0.2)
nRBC: 0.3 % — ABNORMAL HIGH (ref 0.0–0.2)

## 2018-10-06 LAB — BASIC METABOLIC PANEL
Anion gap: 13 (ref 5–15)
BUN: 16 mg/dL (ref 8–23)
CO2: 24 mmol/L (ref 22–32)
Calcium: 7.6 mg/dL — ABNORMAL LOW (ref 8.9–10.3)
Chloride: 105 mmol/L (ref 98–111)
Creatinine, Ser: 0.94 mg/dL (ref 0.61–1.24)
GFR calc non Af Amer: >60 mL/min (ref >60)
Glucose, Bld: 111 mg/dL — ABNORMAL HIGH (ref 70–99)
Potassium: 3.3 mmol/L — ABNORMAL LOW (ref 3.5–5.1)
Sodium: 142 mmol/L (ref 135–145)

## 2018-10-06 LAB — TYPE AND SCREEN
ABO/RH(D): O POS
ANTIBODY SCREEN: NEGATIVE
UNIT DIVISION: 0
Unit division: 0
Unit division: 0

## 2018-10-06 LAB — BPAM RBC
Blood Product Expiration Date: 202003162359
Blood Product Expiration Date: 202003172359
Blood Product Expiration Date: 202003192359
ISSUE DATE / TIME: 202002221103
ISSUE DATE / TIME: 202002221313
ISSUE DATE / TIME: 202002222300
UNIT TYPE AND RH: 5100
Unit Type and Rh: 5100
Unit Type and Rh: 5100

## 2018-10-06 LAB — URINALYSIS, COMPLETE (UACMP) WITH MICROSCOPIC
BILIRUBIN URINE: NEGATIVE
Glucose, UA: 50 mg/dL — AB
Ketones, ur: NEGATIVE mg/dL
Leukocytes,Ua: NEGATIVE
Nitrite: NEGATIVE
Protein, ur: NEGATIVE mg/dL
SPECIFIC GRAVITY, URINE: 1.011 (ref 1.005–1.030)
pH: 7 (ref 5.0–8.0)

## 2018-10-06 LAB — CK: Total CK: 25069 U/L — ABNORMAL HIGH (ref 49–397)

## 2018-10-06 MED ORDER — ENOXAPARIN SODIUM 40 MG/0.4ML ~~LOC~~ SOLN
40.0000 mg | SUBCUTANEOUS | Status: DC
  Administered 2018-10-06 – 2018-10-08 (×3): 40 mg via SUBCUTANEOUS
  Filled 2018-10-06 (×3): qty 0.4

## 2018-10-06 MED ORDER — POTASSIUM CHLORIDE CRYS ER 20 MEQ PO TBCR
40.0000 meq | EXTENDED_RELEASE_TABLET | Freq: Every day | ORAL | Status: DC
  Administered 2018-10-06 – 2018-10-08 (×3): 40 meq via ORAL
  Filled 2018-10-06 (×3): qty 2
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
## 2018-10-06 NOTE — Progress Notes (Addendum)
Patient ID: DEZ STACHURSKI, male   DOB: 1956-07-31, 63 y.o.   MRN: [id number]     Subjective:  Patient reports pain as mild to moderate.  Patient alert and in no acute distress follows commands  Objective:   VITALS:   Vitals:   10/06/18 0700 10/06/18 0800 10/06/18 0847 10/06/18 0900  BP: (!) 143/89 (!) 166/91  (!) 159/82  Pulse: 98 96  97  Resp: (!) 38 (!) 38  (!) 33  Temp: 99.7 F (37.6 C) 99.5 F (37.5 C)  99.7 F (37.6 C)  TempSrc:  Bladder    SpO2: 97% 98% 90% 97%    ABD soft Sensation intact distally Dorsiflexion/Plantar flexion intact Incision: dressing C/D/I and scant drainage Ex fix in in place and drainage from the pins is better  Lab Results  Component Value Date   WBC 10.6 (H) 10/06/2018   HGB 9.2 (L) 10/06/2018   HCT 27.7 (L) 10/06/2018   MCV 89.1 10/06/2018   PLT 109 (L) 10/06/2018   BMET    Component Value Date/Time   NA 142 10/06/2018 0244   K 3.3 (L) 10/06/2018 0244   CL 105 10/06/2018 0244   CO2 24 10/06/2018 0244   GLUCOSE 111 (H) 10/06/2018 0244   BUN 16 10/06/2018 0244   CREATININE 0.94 10/06/2018 0244   CALCIUM 7.6 (L) 10/06/2018 0244   GFRNONAA >60 10/06/2018 0244   GFRAA >60 10/06/2018 0244     Assessment/Plan: 3 Days Post-Op   Active Problems:   Pelvic fracture (HCC)   Displaced transverse fracture of left acetabulum, initial encounter for closed fracture (HCC)   Closed pilon fracture of right tibia   Cerebral embolism with cerebral infarction   Continue plan per trauma Continue plan per neuro Dr Jena Gauss to take patient back to OR for ORIF tibia  ABLA improved    Nelda Severe 10/06/2018, 10:40 AM  Discussed and agree with above, patient apparently also with left shoulder pain, plan for x-rays, possible advanced imaging depending on findings.   Teryl Lucy, MD Cell [phone]

## 2018-10-06 NOTE — Progress Notes (Signed)
Notified Dr. Andrey Campanile of K 3.1, 40 mEq K tablet ordered once.  1300- Temp 100.6, OK to give 2nd unit of blood, tylenol 650mg  ordered once per Dr. Andrey Campanile  2nd unit of blood started 1323, turned off at 1335 due to IV infiltration and put in biohazard bin at 1600 when RNs or IV team unable to establish peripheral access, PICC ordered by trauma and OK'd by Dr. Arta Silence with nephrology.  2nd unit to be reordered after PICC placement.   Will continue to monitor.
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
## 2018-10-06 NOTE — Progress Notes (Signed)
Rehab Admissions Coordinator Note:  Patient was screened by Trish Mage for appropriateness for an Inpatient Acute Rehab Consult.  At this time, OR procedures are pending.  Once all surgeries are completed, may want to consider inpatient rehab consult.  Lelon Frohlich M 10/06/2018, 5:27 PM  I can be reached at [phone].
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
## 2018-10-06 NOTE — Progress Notes (Deleted)
OT Cancellation Note  Patient Details Name: CORT KOIS MRN: [id number] DOB: [birth year]   Cancelled Treatment:    Reason Eval/Treat Not Completed: Patient not medically ready, lay flat in bed ordered.  Will follow and initiate OT when medically appropriate and able.   Chancy Milroy, OT Acute Rehabilitation Services Pager [phone] Office [phone]    Chancy Milroy 10/06/2018, 1:22 PM

## 2018-10-07 ENCOUNTER — Encounter (HOSPITAL_COMMUNITY): Payer: Self-pay | Admitting: Student

## 2018-10-07 DIAGNOSIS — J13 Pneumonia due to Streptococcus pneumoniae: Secondary | ICD-10-CM

## 2018-10-07 LAB — BASIC METABOLIC PANEL
Anion gap: 9 (ref 5–15)
BUN: 15 mg/dL (ref 8–23)
CO2: 28 mmol/L (ref 22–32)
Calcium: 7.8 mg/dL — ABNORMAL LOW (ref 8.9–10.3)
Chloride: 103 mmol/L (ref 98–111)
Creatinine, Ser: 0.99 mg/dL (ref 0.61–1.24)
GFR calc Af Amer: >60 mL/min (ref >60)
GFR calc non Af Amer: >60 mL/min (ref >60)
Glucose, Bld: 121 mg/dL — ABNORMAL HIGH (ref 70–99)
Potassium: 3.1 mmol/L — ABNORMAL LOW (ref 3.5–5.1)
Sodium: 140 mmol/L (ref 135–145)

## 2018-10-07 LAB — CBC
HCT: 31.9 % — ABNORMAL LOW (ref 39.0–52.0)
Hemoglobin: 10.2 g/dL — ABNORMAL LOW (ref 13.0–17.0)
MCH: 28.4 pg (ref 26.0–34.0)
MCHC: 32 g/dL (ref 30.0–36.0)
MCV: 88.9 fL (ref 80.0–100.0)
PLATELETS: 146 10*3/uL — AB (ref 150–400)
RBC: 3.59 MIL/uL — AB (ref 4.22–5.81)
RDW: 15 % (ref 11.5–15.5)
WBC: 11.4 10*3/uL — ABNORMAL HIGH (ref 4.0–10.5)
nRBC: 0 % (ref 0.0–0.2)

## 2018-10-07 LAB — CK: Total CK: 17452 U/L — ABNORMAL HIGH (ref 49–397)

## 2018-10-07 MED ORDER — POTASSIUM CHLORIDE CRYS ER 20 MEQ PO TBCR
40.0000 meq | EXTENDED_RELEASE_TABLET | Freq: Once | ORAL | Status: AC
  Administered 2018-10-07: 40 meq via ORAL
  Filled 2018-10-07: qty 2

## 2018-10-07 MED ORDER — SODIUM CHLORIDE 0.9 % IV SOLN
2.0000 g | Freq: Two times a day (BID) | INTRAVENOUS | Status: AC
  Administered 2018-10-07 – 2018-10-11 (×10): 2 g via INTRAVENOUS
  Filled 2018-10-07 (×12): qty 2

## 2018-10-07 MED ORDER — METHOCARBAMOL 500 MG PO TABS
500.0000 mg | ORAL_TABLET | Freq: Three times a day (TID) | ORAL | Status: DC | PRN
  Administered 2018-10-08 – 2018-10-11 (×6): 500 mg via ORAL
  Filled 2018-10-07 (×6): qty 1

## 2018-10-07 MED ORDER — BOOST / RESOURCE BREEZE PO LIQD CUSTOM
1.0000 | Freq: Three times a day (TID) | ORAL | Status: DC
  Administered 2018-10-07 – 2018-10-14 (×17): 1 via ORAL
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
## 2018-10-07 NOTE — Progress Notes (Signed)
Patient ID: Kyle Mcconnell, male   DOB: Dec 05, 1955, 63 y.o.   MRN: [id number] Patient overall seems be doing well condition of back pain but denies any pain into his legs or numbness and tingling in his legs outside of was experiencing with hisorthopedic fractures.  1 patient is able from an orthopedic perspective to get out of bed, then let's proceed forward with an upright sitting x-ray in his brace. I would also like an MRI of his thoracic spine with patient's able to go down. That study has been ordered.
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
## 2018-10-08 ENCOUNTER — Encounter (HOSPITAL_COMMUNITY): Payer: Self-pay | Admitting: General Practice

## 2018-10-08 ENCOUNTER — Inpatient Hospital Stay (HOSPITAL_COMMUNITY): Payer: No Typology Code available for payment source

## 2018-10-08 ENCOUNTER — Other Ambulatory Visit: Payer: Self-pay

## 2018-10-08 LAB — CBC
HEMATOCRIT: 29.9 % — AB (ref 39.0–52.0)
Hemoglobin: 9.2 g/dL — ABNORMAL LOW (ref 13.0–17.0)
MCH: 27.5 pg (ref 26.0–34.0)
MCHC: 30.8 g/dL (ref 30.0–36.0)
MCV: 89.3 fL (ref 80.0–100.0)
Platelets: 155 10*3/uL (ref 150–400)
RBC: 3.35 MIL/uL — ABNORMAL LOW (ref 4.22–5.81)
RDW: 14.6 % (ref 11.5–15.5)
WBC: 9.2 10*3/uL (ref 4.0–10.5)
nRBC: 0 % (ref 0.0–0.2)

## 2018-10-08 LAB — BASIC METABOLIC PANEL
ANION GAP: 13 (ref 5–15)
BUN: 14 mg/dL (ref 8–23)
CO2: 24 mmol/L (ref 22–32)
Calcium: 7.7 mg/dL — ABNORMAL LOW (ref 8.9–10.3)
Chloride: 103 mmol/L (ref 98–111)
Creatinine, Ser: 0.9 mg/dL (ref 0.61–1.24)
GFR calc Af Amer: >60 mL/min (ref >60)
GFR calc non Af Amer: >60 mL/min (ref >60)
Glucose, Bld: 109 mg/dL — ABNORMAL HIGH (ref 70–99)
POTASSIUM: 3.4 mmol/L — AB (ref 3.5–5.1)
Sodium: 140 mmol/L (ref 135–145)

## 2018-10-08 LAB — CK: Total CK: 9135 U/L — ABNORMAL HIGH (ref 49–397)

## 2018-10-08 MED ORDER — TRAMADOL HCL 50 MG PO TABS
100.0000 mg | ORAL_TABLET | Freq: Four times a day (QID) | ORAL | Status: DC | PRN
  Administered 2018-10-08 – 2018-10-10 (×5): 100 mg via ORAL
  Filled 2018-10-08 (×5): qty 2

## 2018-10-08 MED ORDER — POTASSIUM CHLORIDE CRYS ER 20 MEQ PO TBCR
40.0000 meq | EXTENDED_RELEASE_TABLET | Freq: Two times a day (BID) | ORAL | Status: DC
  Administered 2018-10-08 – 2018-10-13 (×10): 40 meq via ORAL
  Filled 2018-10-08 (×12): qty 2
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
## 2018-10-08 NOTE — Progress Notes (Signed)
CSW completed Sbirt with the patient. Patient reports that he does not drink or use drugs.   No further social work intervention at this time.   CSW signing off.   Drucilla Schmidt, MSW, LCSW-A Clinical Social Worker [employer]
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
## 2018-10-09 ENCOUNTER — Inpatient Hospital Stay (HOSPITAL_COMMUNITY): Payer: No Typology Code available for payment source

## 2018-10-09 ENCOUNTER — Encounter (HOSPITAL_COMMUNITY): Payer: Self-pay | Admitting: Student

## 2018-10-09 ENCOUNTER — Inpatient Hospital Stay (HOSPITAL_COMMUNITY): Payer: No Typology Code available for payment source | Admitting: Anesthesiology

## 2018-10-09 ENCOUNTER — Encounter (HOSPITAL_COMMUNITY): Admission: EM | Disposition: A | Discharge status: Rehabilitation Facility | Payer: Self-pay | Source: Home / Self Care

## 2018-10-09 HISTORY — PX: OPEN REDUCTION INTERNAL FIXATION (ORIF) TIBIA/FIBULA FRACTURE: SHX5992

## 2018-10-09 LAB — BASIC METABOLIC PANEL
Anion gap: 9 (ref 5–15)
BUN: 14 mg/dL (ref 8–23)
CO2: 26 mmol/L (ref 22–32)
Calcium: 7.6 mg/dL — ABNORMAL LOW (ref 8.9–10.3)
Chloride: 103 mmol/L (ref 98–111)
Creatinine, Ser: 0.83 mg/dL (ref 0.61–1.24)
GFR calc Af Amer: >60 mL/min (ref >60)
GFR calc non Af Amer: >60 mL/min (ref >60)
Glucose, Bld: 113 mg/dL — ABNORMAL HIGH (ref 70–99)
Potassium: 3.8 mmol/L (ref 3.5–5.1)
Sodium: 138 mmol/L (ref 135–145)

## 2018-10-09 LAB — CBC
HCT: 28.8 % — ABNORMAL LOW (ref 39.0–52.0)
Hemoglobin: 9 g/dL — ABNORMAL LOW (ref 13.0–17.0)
MCH: 28 pg (ref 26.0–34.0)
MCHC: 31.3 g/dL (ref 30.0–36.0)
MCV: 89.4 fL (ref 80.0–100.0)
Platelets: 174 10*3/uL (ref 150–400)
RBC: 3.22 MIL/uL — ABNORMAL LOW (ref 4.22–5.81)
RDW: 14.5 % (ref 11.5–15.5)
WBC: 11.5 10*3/uL — ABNORMAL HIGH (ref 4.0–10.5)
nRBC: 0 % (ref 0.0–0.2)

## 2018-10-09 LAB — CK: Total CK: 5530 U/L — ABNORMAL HIGH (ref 49–397)

## 2018-10-09 SURGERY — OPEN REDUCTION INTERNAL FIXATION (ORIF) TIBIA/FIBULA FRACTURE
Anesthesia: General | Laterality: Right

## 2018-10-09 MED ORDER — PROPOFOL 10 MG/ML IV BOLUS
INTRAVENOUS | Status: AC
  Filled 2018-10-09: qty 20

## 2018-10-09 MED ORDER — 0.9 % SODIUM CHLORIDE (POUR BTL) OPTIME
TOPICAL | Status: DC | PRN
  Administered 2018-10-09: 1000 mL

## 2018-10-09 MED ORDER — ENOXAPARIN SODIUM 40 MG/0.4ML ~~LOC~~ SOLN
40.0000 mg | SUBCUTANEOUS | Status: DC
  Administered 2018-10-10 – 2018-10-14 (×5): 40 mg via SUBCUTANEOUS
  Filled 2018-10-09 (×6): qty 0.4

## 2018-10-09 MED ORDER — DOUBLE ANTIBIOTIC 500-10000 UNIT/GM EX OINT
TOPICAL_OINTMENT | CUTANEOUS | Status: AC
  Filled 2018-10-09: qty 1

## 2018-10-09 MED ORDER — TOBRAMYCIN SULFATE 1.2 G IJ SOLR
INTRAMUSCULAR | Status: DC | PRN
  Administered 2018-10-09: 1.2 g via TOPICAL

## 2018-10-09 MED ORDER — PROPOFOL 10 MG/ML IV BOLUS
INTRAVENOUS | Status: DC | PRN
  Administered 2018-10-09: 110 mg via INTRAVENOUS

## 2018-10-09 MED ORDER — VANCOMYCIN HCL 1000 MG IV SOLR
INTRAVENOUS | Status: AC
  Filled 2018-10-09: qty 1000

## 2018-10-09 MED ORDER — HYDROMORPHONE HCL 1 MG/ML IJ SOLN: 0.2500 mg | INTRAMUSCULAR | Status: DC | PRN

## 2018-10-09 MED ORDER — FENTANYL CITRATE (PF) 250 MCG/5ML IJ SOLN
INTRAMUSCULAR | Status: DC | PRN
  Administered 2018-10-09 (×2): 100 ug via INTRAVENOUS
  Administered 2018-10-09: 150 ug via INTRAVENOUS
  Administered 2018-10-09: 100 ug via INTRAVENOUS

## 2018-10-09 MED ORDER — LACTATED RINGERS IV SOLN
INTRAVENOUS | Status: DC | PRN
  Administered 2018-10-09 (×2): via INTRAVENOUS

## 2018-10-09 MED ORDER — DEXAMETHASONE SODIUM PHOSPHATE 10 MG/ML IJ SOLN
INTRAMUSCULAR | Status: DC | PRN
  Administered 2018-10-09: 8 mg via INTRAVENOUS

## 2018-10-09 MED ORDER — CEFAZOLIN SODIUM-DEXTROSE 2-3 GM-%(50ML) IV SOLR
INTRAVENOUS | Status: DC | PRN
  Administered 2018-10-09: 2 g via INTRAVENOUS

## 2018-10-09 MED ORDER — VANCOMYCIN HCL 1000 MG IV SOLR
INTRAVENOUS | Status: DC | PRN
  Administered 2018-10-09: 1000 mg via TOPICAL

## 2018-10-09 MED ORDER — ONDANSETRON HCL 4 MG/2ML IJ SOLN: 4.0000 mg | Freq: Once | INTRAMUSCULAR | Status: DC | PRN

## 2018-10-09 MED ORDER — FENTANYL CITRATE (PF) 250 MCG/5ML IJ SOLN
INTRAMUSCULAR | Status: AC
  Filled 2018-10-09: qty 5

## 2018-10-09 MED ORDER — KETOROLAC TROMETHAMINE 15 MG/ML IJ SOLN
15.0000 mg | Freq: Four times a day (QID) | INTRAMUSCULAR | Status: AC
  Administered 2018-10-09 – 2018-10-10 (×4): 15 mg via INTRAVENOUS
  Filled 2018-10-09 (×4): qty 1

## 2018-10-09 MED ORDER — MEPERIDINE HCL 50 MG/ML IJ SOLN: 6.2500 mg | INTRAMUSCULAR | Status: DC | PRN

## 2018-10-09 MED ORDER — LIDOCAINE 2% (20 MG/ML) 5 ML SYRINGE
INTRAMUSCULAR | Status: AC
  Filled 2018-10-09: qty 5

## 2018-10-09 MED ORDER — TOBRAMYCIN SULFATE 1.2 G IJ SOLR
INTRAMUSCULAR | Status: AC
  Filled 2018-10-09: qty 1.2

## 2018-10-09 MED ORDER — ROCURONIUM BROMIDE 50 MG/5ML IV SOSY
PREFILLED_SYRINGE | INTRAVENOUS | Status: AC
  Filled 2018-10-09: qty 10

## 2018-10-09 MED ORDER — CEFAZOLIN SODIUM-DEXTROSE 2-4 GM/100ML-% IV SOLN: 2.0000 g | Freq: Three times a day (TID) | INTRAVENOUS | Status: DC

## 2018-10-09 MED ORDER — SODIUM CHLORIDE 0.9 % IV SOLN
INTRAVENOUS | Status: DC | PRN
  Administered 2018-10-09: 25 ug/min via INTRAVENOUS

## 2018-10-09 MED ORDER — CEFAZOLIN SODIUM 1 G IJ SOLR
INTRAMUSCULAR | Status: AC
  Filled 2018-10-09: qty 20

## 2018-10-09 MED ORDER — ROCURONIUM BROMIDE 10 MG/ML (PF) SYRINGE
PREFILLED_SYRINGE | INTRAVENOUS | Status: DC | PRN
  Administered 2018-10-09: 50 mg via INTRAVENOUS
  Administered 2018-10-09: 10 mg via INTRAVENOUS
  Administered 2018-10-09 (×2): 20 mg via INTRAVENOUS

## 2018-10-09 MED ORDER — SUGAMMADEX SODIUM 200 MG/2ML IV SOLN
INTRAVENOUS | Status: DC | PRN
  Administered 2018-10-09: 200 mg via INTRAVENOUS

## 2018-10-09 MED ORDER — MIDAZOLAM HCL 2 MG/2ML IJ SOLN
INTRAMUSCULAR | Status: AC
  Filled 2018-10-09: qty 2

## 2018-10-09 SURGICAL SUPPLY — 95 items
BANDAGE ACE 4X5 VEL STRL LF (GAUZE/BANDAGES/DRESSINGS) ×3 IMPLANT
BANDAGE ACE 6X5 VEL STRL LF (GAUZE/BANDAGES/DRESSINGS) ×3 IMPLANT
BANDAGE ESMARK 6X9 LF (GAUZE/BANDAGES/DRESSINGS) ×1 IMPLANT
BIT DRILL 2.5X2.75 QC CALB (BIT) ×2 IMPLANT
BIT DRILL CALIBRATED 2.7 (BIT) ×1 IMPLANT
BIT DRILL CALIBRATED 2.7MM (BIT) ×1
BLADE CLIPPER SURG (BLADE) IMPLANT
BNDG CMPR 9X6 STRL LF SNTH (GAUZE/BANDAGES/DRESSINGS) ×1
BNDG COHESIVE 4X5 TAN STRL (GAUZE/BANDAGES/DRESSINGS) IMPLANT
BNDG ESMARK 6X9 LF (GAUZE/BANDAGES/DRESSINGS) ×3
BNDG GAUZE ELAST 4 BULKY (GAUZE/BANDAGES/DRESSINGS) ×3 IMPLANT
BRUSH SCRUB SURG 4.25 DISP (MISCELLANEOUS) ×6 IMPLANT
CHLORAPREP W/TINT 26ML (MISCELLANEOUS) ×3 IMPLANT
COVER MAYO STAND STRL (DRAPES) ×3 IMPLANT
COVER WAND RF STERILE (DRAPES) ×3 IMPLANT
DRAPE C-ARM 42X72 X-RAY (DRAPES) ×3 IMPLANT
DRAPE C-ARMOR (DRAPES) ×3 IMPLANT
DRAPE HALF SHEET 40X57 (DRAPES) ×6 IMPLANT
DRAPE INCISE IOBAN 66X45 STRL (DRAPES) ×3 IMPLANT
DRAPE ORTHO SPLIT 77X108 STRL (DRAPES) ×6
DRAPE SURG ORHT 6 SPLT 77X108 (DRAPES) IMPLANT
DRAPE U-SHAPE 47X51 STRL (DRAPES) ×3 IMPLANT
DRSG ADAPTIC 3X8 NADH LF (GAUZE/BANDAGES/DRESSINGS) ×3 IMPLANT
DRSG PAD ABDOMINAL 8X10 ST (GAUZE/BANDAGES/DRESSINGS) ×12 IMPLANT
DRSG VAC ATS MED SENSATRAC (GAUZE/BANDAGES/DRESSINGS) ×2 IMPLANT
ELECT REM PT RETURN 9FT ADLT (ELECTROSURGICAL) ×3
ELECTRODE REM PT RTRN 9FT ADLT (ELECTROSURGICAL) ×1 IMPLANT
GAUZE SPONGE 4X4 12PLY STRL (GAUZE/BANDAGES/DRESSINGS) ×3 IMPLANT
GLOVE BIO SURGEON STRL SZ 6.5 (GLOVE) ×6 IMPLANT
GLOVE BIO SURGEON STRL SZ7.5 (GLOVE) ×12 IMPLANT
GLOVE BIO SURGEONS STRL SZ 6.5 (GLOVE) ×3
GLOVE BIOGEL PI IND STRL 6.5 (GLOVE) ×1 IMPLANT
GLOVE BIOGEL PI IND STRL 7.5 (GLOVE) ×1 IMPLANT
GLOVE BIOGEL PI INDICATOR 6.5 (GLOVE) ×2
GLOVE BIOGEL PI INDICATOR 7.5 (GLOVE) ×2
GLOVE PROGUARD SZ 7 1/2 (GLOVE) ×3 IMPLANT
GOWN STRL REUS W/ TWL LRG LVL3 (GOWN DISPOSABLE) ×2 IMPLANT
GOWN STRL REUS W/TWL LRG LVL3 (GOWN DISPOSABLE) ×6
K-WIRE ACE 1.6X6 (WIRE) ×27
KIT BASIN OR (CUSTOM PROCEDURE TRAY) ×3 IMPLANT
KIT TURNOVER KIT B (KITS) ×3 IMPLANT
KWIRE ACE 1.6X6 (WIRE) IMPLANT
MANIFOLD NEPTUNE II (INSTRUMENTS) ×3 IMPLANT
NS IRRIG 1000ML POUR BTL (IV SOLUTION) ×3 IMPLANT
PACK TOTAL JOINT (CUSTOM PROCEDURE TRAY) ×3 IMPLANT
PAD ARMBOARD 7.5X6 YLW CONV (MISCELLANEOUS) ×6 IMPLANT
PAD CAST 4YDX4 CTTN HI CHSV (CAST SUPPLIES) ×1 IMPLANT
PADDING CAST COTTON 4X4 STRL (CAST SUPPLIES) ×3
PADDING CAST COTTON 6X4 STRL (CAST SUPPLIES) ×3 IMPLANT
PLATE 9H RT DIST ANTLAT TIB (Plate) ×3 IMPLANT
PLATE ANTLAT CNTR NAR 156X9 (Plate) IMPLANT
PLATE LOCK 6H 139 RT DIST FIB (Plate) ×2 IMPLANT
SCREW CORT FT 32X3.5XNONLOCK (Screw) IMPLANT
SCREW CORTICAL 3.5MM  28MM (Screw) ×2 IMPLANT
SCREW CORTICAL 3.5MM  30MM (Screw) ×2 IMPLANT
SCREW CORTICAL 3.5MM  32MM (Screw) ×2 IMPLANT
SCREW CORTICAL 3.5MM 28MM (Screw) IMPLANT
SCREW CORTICAL 3.5MM 30MM (Screw) IMPLANT
SCREW CORTICAL 3.5MM 32MM (Screw) ×1 IMPLANT
SCREW LAG CANC STD HEAD 4.0 70 (Screw) ×2 IMPLANT
SCREW LOCK CORT STAR 3.5X12 (Screw) ×2 IMPLANT
SCREW LOCK CORT STAR 3.5X14 (Screw) ×6 IMPLANT
SCREW LOCK CORT STAR 3.5X16 (Screw) ×2 IMPLANT
SCREW LOCK CORT STAR 3.5X40 (Screw) ×2 IMPLANT
SCREW LOCK CORT STAR 3.5X42 (Screw) ×2 IMPLANT
SCREW LOCK CORT STAR 3.5X54 (Screw) ×2 IMPLANT
SCREW LOW PROFILE 18MMX3.5MM (Screw) ×2 IMPLANT
SCREW LP NL T15 3.5X20 (Screw) ×2 IMPLANT
SCREW NON LOCKING LP 3.5 16MM (Screw) ×4 IMPLANT
SCREW T15 LP CORT 3.5X36MM NS (Screw) ×2 IMPLANT
SCREW T15 LP CORT 3.5X40MM NS (Screw) ×2 IMPLANT
SCREW T15 LP CORT 3.5X48MM NS (Screw) ×2 IMPLANT
SPLINT PLASTER CAST XFAST 5X30 (CAST SUPPLIES) IMPLANT
SPLINT PLASTER XFAST SET 5X30 (CAST SUPPLIES) ×2
SPONGE LAP 18X18 RF (DISPOSABLE) IMPLANT
STAPLER VISISTAT 35W (STAPLE) ×3 IMPLANT
STOCKINETTE IMPERVIOUS LG (DRAPES) IMPLANT
SUCTION FRAZIER HANDLE 10FR (MISCELLANEOUS) ×2
SUCTION TUBE FRAZIER 10FR DISP (MISCELLANEOUS) ×1 IMPLANT
SUT ETHILON 3 0 PS 1 (SUTURE) ×8 IMPLANT
SUT MNCRL AB 3-0 PS2 18 (SUTURE) ×3 IMPLANT
SUT PROLENE 0 CT (SUTURE) IMPLANT
SUT VIC AB 0 CT1 27 (SUTURE) ×3
SUT VIC AB 0 CT1 27XBRD ANBCTR (SUTURE) ×1 IMPLANT
SUT VIC AB 1 CT1 27 (SUTURE) ×3
SUT VIC AB 1 CT1 27XBRD ANBCTR (SUTURE) ×1 IMPLANT
SUT VIC AB 2-0 CT1 27 (SUTURE) ×6
SUT VIC AB 2-0 CT1 TAPERPNT 27 (SUTURE) ×2 IMPLANT
TOWEL OR 17X24 6PK STRL BLUE (TOWEL DISPOSABLE) ×3 IMPLANT
TOWEL OR 17X26 10 PK STRL BLUE (TOWEL DISPOSABLE) ×6 IMPLANT
TRAY FOLEY MTR SLVR 16FR STAT (SET/KITS/TRAYS/PACK) IMPLANT
TUBE CONNECTING 12'X1/4 (SUCTIONS) ×1
TUBE CONNECTING 12X1/4 (SUCTIONS) ×2 IMPLANT
WATER STERILE IRR 1000ML POUR (IV SOLUTION) ×6 IMPLANT
YANKAUER SUCT BULB TIP NO VENT (SUCTIONS) ×3 IMPLANT

## 2018-10-09 NOTE — Anesthesia Postprocedure Evaluation (Signed)
Anesthesia Post Note  Patient: ARPAN ARNWINE  Procedure(s) Performed: OPEN REDUCTION INTERNAL FIXATION (ORIF) RIGHT PILON FRACTURE (Right )     Patient location during evaluation: PACU Anesthesia Type: General Level of consciousness: awake and alert and oriented Pain management: pain level controlled Vital Signs Assessment: post-procedure vital signs reviewed and stable Respiratory status: spontaneous breathing, nonlabored ventilation, respiratory function stable and patient connected to nasal cannula oxygen Cardiovascular status: blood pressure returned to baseline and stable Postop Assessment: no apparent nausea or vomiting Anesthetic complications: no    Last Vitals:  Vitals:   10/09/18 1300 10/09/18 1315  BP: (!) 165/91 (!) 154/92  Pulse: (!) 106 (!) 103  Resp: (!) 29 18  Temp:  36.6 C  SpO2: 95% 99%    Last Pain:  Vitals:   10/09/18 1315  TempSrc:   PainSc: 0-No pain                 Marliyah Reid A.
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
## 2018-10-09 NOTE — Interval H&P Note (Signed)
History and Physical Interval Note:  10/09/2018 7:28 AM  Kyle Mcconnell  has presented today for surgery, with the diagnosis of Right pilon fracture  The various methods of treatment have been discussed with the patient and family. After consideration of risks, benefits and other options for treatment, the patient has consented to  Procedure(s): OPEN REDUCTION INTERNAL FIXATION (ORIF) RIGHT PILON FRACTURE (Right) as a surgical intervention .  The patient's history has been reviewed, patient examined, no change in status, stable for surgery.  I have reviewed the patient's chart and labs.  Questions were answered to the patient's satisfaction.     Caryn Bee P Telena Peyser

## 2018-10-09 NOTE — Anesthesia Procedure Notes (Signed)
Procedure Name: Intubation Date/Time: 10/09/2018 7:44 AM Performed by: Marena Chancy, CRNA Pre-anesthesia Checklist: Patient identified, Emergency Drugs available, Suction available and Patient being monitored Patient Re-evaluated:Patient Re-evaluated prior to induction Oxygen Delivery Method: Circle System Utilized Preoxygenation: Pre-oxygenation with 100% oxygen Induction Type: IV induction Ventilation: Mask ventilation without difficulty Laryngoscope Size: Miller and 3 Tube type: Oral Tube size: 7.5 mm Number of attempts: 1 Airway Equipment and Method: Stylet and Oral airway Placement Confirmation: ETT inserted through vocal cords under direct vision,  positive ETCO2 and breath sounds checked- equal and bilateral Tube secured with: Tape Dental Injury: Teeth and Oropharynx as per pre-operative assessment

## 2018-10-09 NOTE — Transfer of Care (Signed)
Immediate Anesthesia Transfer of Care Note  Patient: Kyle Mcconnell  Procedure(s) Performed: OPEN REDUCTION INTERNAL FIXATION (ORIF) RIGHT PILON FRACTURE (Right )  Patient Location: PACU  Anesthesia Type:General  Level of Consciousness: awake, alert  and oriented  Airway & Oxygen Therapy: Patient Spontanous Breathing and Patient connected to face mask oxygen  Post-op Assessment: Report given to RN and Post -op Vital signs reviewed and stable  Post vital signs: Reviewed and stable  Last Vitals:  Vitals Value Taken Time  BP 152/99 10/09/2018 12:48 PM  Temp    Pulse 108 10/09/2018 12:52 PM  Resp 19 10/09/2018 12:52 PM  SpO2 97 % 10/09/2018 12:52 PM  Vitals shown include unvalidated device data.  Last Pain:  Vitals:   10/09/18 0400  TempSrc: Oral  PainSc:       Patients Stated Pain Goal: 2 (09/30/18 1736)  Complications: No apparent anesthesia complications

## 2018-10-09 NOTE — Op Note (Signed)
[employer] E<[phone] Hamming Kitchen<BADTEXTTAG e<M [phone] Waste IndusGames deKentuckyvelo161ElenoraSportsortho Surgery Center LLC Fenderes ENT>04.5Mardella LaymanSemWise Regional Health SEast P147[facility] LLCoinA78rTheme park managericAtrium Health University Marla[address] > CompanyDiannia Ruder 80ESRe29562DeaKentuckytr9897 [place]<MEASUESRe2956Schuyler Amorertr607 Old SoEtta GrandchildKidEphriam KnucklesXTTAG>Hayden R45 BeMinden Fami Texas Center For Infectious Diseaseo Natalreery Center LLCter 6109AElenora FenderNTexas Health Specialty Hospital Fort WorthT>Schuyler Amorzerillis Ran79 PendergastTristate Su<MEASUREMENTESebRed29562nd Deatra [place]obins na98 SouthMerc ant na24Los32.3cMarland Kitchenhuyler Amorzersco43244/0 1/027244nna d S33.2ivGeMar1.4Lynnae Sandhoff Kitchenor21314.78s 53/66/Sherryll Burger403Maxie BettePaulSeattle Va Medical Center ([facility])na Fusida KidalutarAG>  his swelling to come down on his ankle to allow for surgical incisions.  Once his swelling came down I felt that he was appropriate for open reduction internal fixation.  Risks and benefits were discussed with the patient.  Risks included but not limited to bleeding, infection, malunion,  nonunion, posttraumatic arthritis, hardware failure, stiffness, nerve and blood vessel injury, even the possibility of loss of limb.  He agreed to proceed with surgery and consent was obtained.  Operative Findings: 1.  Severely comminuted intra-articular pilon fracture with multiple intra-articular fragments with delaminated cartilage. 2.  Open reduction internal fixation of tibia and fibula using an anterior medial approach using [employer] anterior lateral distal tibial plate and a anatomic lateral plate through a direct lateral approach. 3.  Removal of external fixator with debridement of external fixator pins. 4.  Incisional wound VAC placement.  Procedure: The patient was identified in the preoperative holding area. Consent was confirmed with the patient and their family and all questions were answered. The operative extremity was marked after confirmation with the patient. he was then brought back to the operating room by our anesthesia colleagues.  He was carefully transferred over to a radiolucent flat top table.  He was placed under general anesthetic.  The operative extremity was then prepped and draped in usual sterile fashion. A preoperative timeout was performed to verify the patient, the procedure, and the extremity. Preoperative antibiotics were dosed.  Fluoroscopy was obtained to identify the amount of displacement and comminution of the fracture.  I then started with an anterior medial approach.  Directed that just medial to the anterior tibialis tendon.  I carried this down through skin and subcutaneous tissue.  I protected nerve branches of the saphenous nerve.  I then used a full-thickness skin flap and incised through the periosteum.  I then entered the fracture fragments.  I identified the main articular fragment that was displaced and medial and posterior.  I was able to perform a provisional reduction.  I then debrided all of the intra-articular loose bodies including the  delaminated cartilage and small osteochondral fragments.  I felt that the ankle was highly unstable and that did provide some provisional fixation with the fixation of the fibula was most appropriate.  I then measured a skin bridge of approximately 8 cm and then performed a direct lateral approach to the distal fibula.  I carried it down through skin and subcutaneous tissues.  I protected the superficial peroneal nerve.  I kept the periosteum around the comminuted fracture fragments.  I incised through the periosteum distally to be able to place a anatomic distal fibular plate.  I provisionally pinned this in place distally and placed a nonlocking and locking screw.  I then pulled the fibula out to length and provisionally held the proximal fragment with a reduction clamp.  I then placed 3 nonlocking screws into the proximal segment.  I then placed locking screws in the distal segment.  A total of 6 screws were placed in the distal segment.  The reduction was anatomic.  I then focused on reduction of the articular surface.  There was a large posterior lateral fragment that I was able to reduce over the talus.  The anterior fragment was reduced and fluoroscopy was used to confirm this.  This was held provisionally with K wires.  I then submuscularly slid a anterior lateral plate.  I percutaneously placed a K wire to align this on the lateral view.  I then placed a nonlocking  screw into the distal segment I then placed a nonlocking screw into the proximal segment and bring the proximal portion of the plate flush to bone.  Another nonlocking screw was placed in the distal segment.  Another nonlocking screw was placed in the proximal segment.  I then placed 3 locking screws in the distal articular block.  And I confirmed adequate reduction on AP and lateral views.  There was severe comminution along the medial cortex and not a full anatomic read for the medial malleolus.  As result I reduce the medial malleolus  and percutaneously placed a 70 mm cannulated screw.  Excellent fixation was obtained.  Some of the comminuted bone was used as bone graft into the anterior medial portion of the metaphysis.  I then irrigated the incisions copiously.  Final fluoroscopic images were obtained.  A gram of vancomycin powder and 1.2 g of tobramycin powder were placed into the incisions.  The medial incision was closed with 0 Vicryl for the periosteum.  The skin was closed with 2-0 Vicryl and 3-0 nylon.  The incision was closed with 2-0 Vicryl and 3-0 nylon.  The percutaneous incisions were closed with 3-0 nylon.  A incisional wound VAC was placed consisting of Adaptic and connected to 125 mmHg.  The ex-fix pin sites were then debrided with a curette and irrigated and closed with 3-0 nylon.  A sterile dressing consisting of bacitracin ointment, Adaptic and 4 x 4's was placed.  A well-padded short leg splint was placed in neutral dorsiflexion.  The patient was then awoken from anesthesia and taken to the PACU in stable condition.  Post Op Plan/Instructions: Patient will be nonweightbearing to bilateral lower extremities.  He received postoperative Ancef.  He will continue with the incisional wound VAC likely until early next week.  He may undergo a MRI from the orthopedic standpoint.  He may start Lovenox once cleared by trauma surgery team.  I was present and performed the entire surgery.  Ulyses Southward, PA-C did assist me throughout the case. An assistant was necessary given the difficulty in approach, maintenance of reduction and ability to instrument the fracture.   Truitt Merle, MD [facility]

## 2018-10-10 ENCOUNTER — Inpatient Hospital Stay (HOSPITAL_COMMUNITY): Payer: No Typology Code available for payment source

## 2018-10-10 ENCOUNTER — Encounter (HOSPITAL_COMMUNITY): Payer: Self-pay | Admitting: Student

## 2018-10-10 LAB — BASIC METABOLIC PANEL
Anion gap: 5 (ref 5–15)
BUN: 17 mg/dL (ref 8–23)
CHLORIDE: 104 mmol/L (ref 98–111)
CO2: 30 mmol/L (ref 22–32)
Calcium: 8 mg/dL — ABNORMAL LOW (ref 8.9–10.3)
Creatinine, Ser: 1.04 mg/dL (ref 0.61–1.24)
GFR calc Af Amer: >60 mL/min (ref >60)
GFR calc non Af Amer: >60 mL/min (ref >60)
Glucose, Bld: 147 mg/dL — ABNORMAL HIGH (ref 70–99)
POTASSIUM: 4.2 mmol/L (ref 3.5–5.1)
Sodium: 139 mmol/L (ref 135–145)

## 2018-10-10 LAB — CBC
HCT: 27.6 % — ABNORMAL LOW (ref 39.0–52.0)
HEMOGLOBIN: 8.6 g/dL — AB (ref 13.0–17.0)
MCH: 27.7 pg (ref 26.0–34.0)
MCHC: 31.2 g/dL (ref 30.0–36.0)
MCV: 88.7 fL (ref 80.0–100.0)
NRBC: 0 % (ref 0.0–0.2)
Platelets: 203 10*3/uL (ref 150–400)
RBC: 3.11 MIL/uL — AB (ref 4.22–5.81)
RDW: 14.3 % (ref 11.5–15.5)
WBC: 13.2 10*3/uL — ABNORMAL HIGH (ref 4.0–10.5)

## 2018-10-10 LAB — CK: Total CK: 5186 U/L — ABNORMAL HIGH (ref 49–397)
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
## 2018-10-10 NOTE — Progress Notes (Signed)
Rehab Admissions Coordinator Note:  Patient was screened by Clois Dupes for appropriateness for an Inpatient Acute Rehab Consult per PT and OT recs. At this time, we are recommending Inpatient Rehab consult.  Clois Dupes RN MSN 10/10/2018, 11:30 PM  I can be reached at [phone].
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
## 2018-10-10 NOTE — Progress Notes (Signed)
  Speech Language Pathology  Patient Details Name: Kyle Mcconnell MRN: [id number] DOB: 02-18-1956 Today's Date: 10/10/2018 Time:  -      Received message from Sammuel Cooper, RN that pt's diet had been upgraded to regular and ST recommended Dys 1. Attempted to see pt today for diet appropriateness/safety and efficiency however he was leaving floor for procedure. Will continue efforts.      10/10/2018, 11:24 AM   Breck Coons Lonell Face.Ed Nurse, children's [phone] Office [phone]
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
## 2018-10-10 NOTE — Care Management Important Message (Signed)
Important Message  Patient Details  Name: Kyle Mcconnell MRN: [id number] Date of Birth: 04-05-1956   Medicare Important Message Given:  Yes    Dorena Bodo 10/10/2018, 8:53 AM

## 2018-10-11 DIAGNOSIS — S22089A Unspecified fracture of T11-T12 vertebra, initial encounter for closed fracture: Secondary | ICD-10-CM

## 2018-10-11 DIAGNOSIS — I63312 Cerebral infarction due to thrombosis of left middle cerebral artery: Secondary | ICD-10-CM

## 2018-10-11 DIAGNOSIS — S82391A Other fracture of lower end of right tibia, initial encounter for closed fracture: Secondary | ICD-10-CM

## 2018-10-11 LAB — CBC
HCT: 28.7 % — ABNORMAL LOW (ref 39.0–52.0)
Hemoglobin: 8.9 g/dL — ABNORMAL LOW (ref 13.0–17.0)
MCH: 27.9 pg (ref 26.0–34.0)
MCHC: 31 g/dL (ref 30.0–36.0)
MCV: 90 fL (ref 80.0–100.0)
NRBC: 0.2 % (ref 0.0–0.2)
Platelets: 236 10*3/uL (ref 150–400)
RBC: 3.19 MIL/uL — ABNORMAL LOW (ref 4.22–5.81)
RDW: 14.6 % (ref 11.5–15.5)
WBC: 13.1 10*3/uL — ABNORMAL HIGH (ref 4.0–10.5)

## 2018-10-11 LAB — BASIC METABOLIC PANEL
ANION GAP: 7 (ref 5–15)
BUN: 15 mg/dL (ref 8–23)
CO2: 28 mmol/L (ref 22–32)
Calcium: 8 mg/dL — ABNORMAL LOW (ref 8.9–10.3)
Chloride: 104 mmol/L (ref 98–111)
Creatinine, Ser: 1.03 mg/dL (ref 0.61–1.24)
GFR calc Af Amer: >60 mL/min (ref >60)
GFR calc non Af Amer: >60 mL/min (ref >60)
GLUCOSE: 124 mg/dL — AB (ref 70–99)
POTASSIUM: 3.9 mmol/L (ref 3.5–5.1)
Sodium: 139 mmol/L (ref 135–145)

## 2018-10-11 LAB — CK: Total CK: 4584 U/L — ABNORMAL HIGH (ref 49–397)

## 2018-10-11 MED ORDER — TRAMADOL HCL 50 MG PO TABS
50.0000 mg | ORAL_TABLET | Freq: Four times a day (QID) | ORAL | Status: DC | PRN
  Administered 2018-10-11 – 2018-10-14 (×5): 100 mg via ORAL
  Filled 2018-10-11 (×6): qty 2

## 2018-10-11 MED ORDER — METHOCARBAMOL 500 MG PO TABS
500.0000 mg | ORAL_TABLET | Freq: Three times a day (TID) | ORAL | Status: DC
  Administered 2018-10-11 – 2018-10-14 (×9): 500 mg via ORAL
  Filled 2018-10-11 (×9): qty 1

## 2018-10-11 MED ORDER — FENTANYL CITRATE (PF) 100 MCG/2ML IJ SOLN
50.0000 ug | INTRAMUSCULAR | Status: DC | PRN
  Administered 2018-10-12 – 2018-10-14 (×3): 50 ug via INTRAVENOUS
  Filled 2018-10-11 (×3): qty 2

## 2018-10-11 MED ORDER — OXYCODONE HCL 5 MG PO TABS: 5.0000 mg | ORAL_TABLET | ORAL | Status: DC | PRN

## 2018-10-11 MED ORDER — TRAMADOL HCL 50 MG PO TABS: 50.0000 mg | ORAL_TABLET | Freq: Four times a day (QID) | ORAL | Status: DC | PRN
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
## 2018-10-11 NOTE — Discharge Instructions (Signed)
Non-weightbearing to both lower extremities Must wear brace when sitting up at all times
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
## 2018-10-12 LAB — BASIC METABOLIC PANEL
ANION GAP: 8 (ref 5–15)
BUN: 15 mg/dL (ref 8–23)
CO2: 28 mmol/L (ref 22–32)
Calcium: 8.9 mg/dL (ref 8.9–10.3)
Chloride: 100 mmol/L (ref 98–111)
Creatinine, Ser: 0.98 mg/dL (ref 0.61–1.24)
GFR calc Af Amer: >60 mL/min (ref >60)
GFR calc non Af Amer: >60 mL/min (ref >60)
Glucose, Bld: 125 mg/dL — ABNORMAL HIGH (ref 70–99)
Potassium: 4.2 mmol/L (ref 3.5–5.1)
Sodium: 136 mmol/L (ref 135–145)

## 2018-10-12 NOTE — Progress Notes (Signed)
Patient ID: Kyle Mcconnell, male   DOB: Nov 28, 1955, 64 y.o.   MRN: [id number] Doing well back pain leg pain well-controlled still appears to have weakness in dorsiflexion of the left foot questionperipheralnerveinjury.Continuetomobilizeinhisbrace
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
## 2018-10-13 NOTE — Progress Notes (Signed)
Patient ID: Kyle Mcconnell, male   DOB: 04/08/1956, 63 y.o.   MRN: [id number] Patient doing well minimal back pain in the brace when he is up  Neurologically nonfocal

## 2018-10-14 ENCOUNTER — Encounter (HOSPITAL_BASED_OUTPATIENT_CLINIC_OR_DEPARTMENT_OTHER): Payer: Self-pay | Admitting: Emergency Medicine

## 2018-10-14 ENCOUNTER — Encounter (HOSPITAL_COMMUNITY): Payer: Self-pay | Admitting: *Deleted

## 2018-10-14 ENCOUNTER — Inpatient Hospital Stay (HOSPITAL_COMMUNITY)
Admission: RE | Admit: 2018-10-14 | Discharge: 2018-10-31 | DRG: 560 | Disposition: A | Discharge status: Home Health | Payer: Medicare Other | Source: Intra-hospital | Attending: Physical Medicine & Rehabilitation | Admitting: Physical Medicine & Rehabilitation

## 2018-10-14 ENCOUNTER — Other Ambulatory Visit: Payer: Self-pay

## 2018-10-14 DIAGNOSIS — E871 Hypo-osmolality and hyponatremia: Secondary | ICD-10-CM | POA: Diagnosis present

## 2018-10-14 DIAGNOSIS — S32402A Unspecified fracture of left acetabulum, initial encounter for closed fracture: Secondary | ICD-10-CM

## 2018-10-14 DIAGNOSIS — N179 Acute kidney failure, unspecified: Secondary | ICD-10-CM

## 2018-10-14 DIAGNOSIS — F4312 Post-traumatic stress disorder, chronic: Secondary | ICD-10-CM | POA: Diagnosis present

## 2018-10-14 DIAGNOSIS — S329XXA Fracture of unspecified parts of lumbosacral spine and pelvis, initial encounter for closed fracture: Secondary | ICD-10-CM

## 2018-10-14 DIAGNOSIS — R040 Epistaxis: Secondary | ICD-10-CM | POA: Diagnosis not present

## 2018-10-14 DIAGNOSIS — R000 Tachycardia, unspecified: Secondary | ICD-10-CM | POA: Diagnosis not present

## 2018-10-14 DIAGNOSIS — S22009A Unspecified fracture of unspecified thoracic vertebra, initial encounter for closed fracture: Secondary | ICD-10-CM

## 2018-10-14 DIAGNOSIS — B1920 Unspecified viral hepatitis C without hepatic coma: Secondary | ICD-10-CM | POA: Diagnosis present

## 2018-10-14 DIAGNOSIS — S8251XD Displaced fracture of medial malleolus of right tibia, subsequent encounter for closed fracture with routine healing: Secondary | ICD-10-CM | POA: Diagnosis not present

## 2018-10-14 DIAGNOSIS — I10 Essential (primary) hypertension: Secondary | ICD-10-CM | POA: Diagnosis present

## 2018-10-14 DIAGNOSIS — R740 Nonspecific elevation of levels of transaminase and lactic acid dehydrogenase [LDH]: Secondary | ICD-10-CM | POA: Diagnosis not present

## 2018-10-14 DIAGNOSIS — G8918 Other acute postprocedural pain: Secondary | ICD-10-CM

## 2018-10-14 DIAGNOSIS — Y92410 Unspecified street and highway as the place of occurrence of the external cause: Secondary | ICD-10-CM | POA: Diagnosis not present

## 2018-10-14 DIAGNOSIS — I69331 Monoplegia of upper limb following cerebral infarction affecting right dominant side: Secondary | ICD-10-CM

## 2018-10-14 DIAGNOSIS — S32452D Displaced transverse fracture of left acetabulum, subsequent encounter for fracture with routine healing: Secondary | ICD-10-CM | POA: Diagnosis present

## 2018-10-14 DIAGNOSIS — S01511D Laceration without foreign body of lip, subsequent encounter: Secondary | ICD-10-CM

## 2018-10-14 DIAGNOSIS — S7012XS Contusion of left thigh, sequela: Secondary | ICD-10-CM | POA: Diagnosis not present

## 2018-10-14 DIAGNOSIS — R7401 Elevation of levels of liver transaminase levels: Secondary | ICD-10-CM

## 2018-10-14 DIAGNOSIS — B182 Chronic viral hepatitis C: Secondary | ICD-10-CM

## 2018-10-14 DIAGNOSIS — F329 Major depressive disorder, single episode, unspecified: Secondary | ICD-10-CM | POA: Diagnosis present

## 2018-10-14 DIAGNOSIS — S82871D Displaced pilon fracture of right tibia, subsequent encounter for closed fracture with routine healing: Secondary | ICD-10-CM

## 2018-10-14 DIAGNOSIS — S32452A Displaced transverse fracture of left acetabulum, initial encounter for closed fracture: Secondary | ICD-10-CM

## 2018-10-14 DIAGNOSIS — I63412 Cerebral infarction due to embolism of left middle cerebral artery: Secondary | ICD-10-CM | POA: Diagnosis not present

## 2018-10-14 DIAGNOSIS — D62 Acute posthemorrhagic anemia: Secondary | ICD-10-CM | POA: Diagnosis present

## 2018-10-14 DIAGNOSIS — R0989 Other specified symptoms and signs involving the circulatory and respiratory systems: Secondary | ICD-10-CM | POA: Diagnosis not present

## 2018-10-14 DIAGNOSIS — S35512D Injury of left iliac artery, subsequent encounter: Secondary | ICD-10-CM

## 2018-10-14 DIAGNOSIS — F418 Other specified anxiety disorders: Secondary | ICD-10-CM | POA: Diagnosis present

## 2018-10-14 DIAGNOSIS — I69322 Dysarthria following cerebral infarction: Secondary | ICD-10-CM | POA: Diagnosis not present

## 2018-10-14 DIAGNOSIS — T1490XA Injury, unspecified, initial encounter: Secondary | ICD-10-CM

## 2018-10-14 DIAGNOSIS — T07XXXA Unspecified multiple injuries, initial encounter: Secondary | ICD-10-CM | POA: Diagnosis not present

## 2018-10-14 DIAGNOSIS — N5089 Other specified disorders of the male genital organs: Secondary | ICD-10-CM | POA: Diagnosis not present

## 2018-10-14 DIAGNOSIS — S7012XD Contusion of left thigh, subsequent encounter: Secondary | ICD-10-CM | POA: Diagnosis not present

## 2018-10-14 DIAGNOSIS — Z87891 Personal history of nicotine dependence: Secondary | ICD-10-CM

## 2018-10-14 DIAGNOSIS — K5900 Constipation, unspecified: Secondary | ICD-10-CM | POA: Diagnosis present

## 2018-10-14 DIAGNOSIS — M7989 Other specified soft tissue disorders: Secondary | ICD-10-CM | POA: Diagnosis not present

## 2018-10-14 DIAGNOSIS — S0101XD Laceration without foreign body of scalp, subsequent encounter: Secondary | ICD-10-CM | POA: Diagnosis not present

## 2018-10-14 DIAGNOSIS — T148XXA Other injury of unspecified body region, initial encounter: Secondary | ICD-10-CM

## 2018-10-14 DIAGNOSIS — S82874A Nondisplaced pilon fracture of right tibia, initial encounter for closed fracture: Secondary | ICD-10-CM

## 2018-10-14 DIAGNOSIS — S32592D Other specified fracture of left pubis, subsequent encounter for fracture with routine healing: Secondary | ICD-10-CM | POA: Diagnosis not present

## 2018-10-14 DIAGNOSIS — R739 Hyperglycemia, unspecified: Secondary | ICD-10-CM | POA: Diagnosis not present

## 2018-10-14 DIAGNOSIS — S22088D Other fracture of T11-T12 vertebra, subsequent encounter for fracture with routine healing: Secondary | ICD-10-CM

## 2018-10-14 DIAGNOSIS — T796XXA Traumatic ischemia of muscle, initial encounter: Secondary | ICD-10-CM

## 2018-10-14 DIAGNOSIS — J189 Pneumonia, unspecified organism: Secondary | ICD-10-CM

## 2018-10-14 DIAGNOSIS — I639 Cerebral infarction, unspecified: Secondary | ICD-10-CM | POA: Diagnosis not present

## 2018-10-14 DIAGNOSIS — E785 Hyperlipidemia, unspecified: Secondary | ICD-10-CM | POA: Diagnosis present

## 2018-10-14 DIAGNOSIS — D72829 Elevated white blood cell count, unspecified: Secondary | ICD-10-CM

## 2018-10-14 DIAGNOSIS — S22088A Other fracture of T11-T12 vertebra, initial encounter for closed fracture: Secondary | ICD-10-CM

## 2018-10-14 DIAGNOSIS — K219 Gastro-esophageal reflux disease without esophagitis: Secondary | ICD-10-CM | POA: Diagnosis present

## 2018-10-14 DIAGNOSIS — R7303 Prediabetes: Secondary | ICD-10-CM | POA: Diagnosis present

## 2018-10-14 DIAGNOSIS — S7012XA Contusion of left thigh, initial encounter: Secondary | ICD-10-CM

## 2018-10-14 LAB — CBC
HCT: 34.5 % — ABNORMAL LOW (ref 39.0–52.0)
Hemoglobin: 10.8 g/dL — ABNORMAL LOW (ref 13.0–17.0)
MCH: 27.7 pg (ref 26.0–34.0)
MCHC: 31.3 g/dL (ref 30.0–36.0)
MCV: 88.5 fL (ref 80.0–100.0)
PLATELETS: 385 10*3/uL (ref 150–400)
RBC: 3.9 MIL/uL — ABNORMAL LOW (ref 4.22–5.81)
RDW: 14.9 % (ref 11.5–15.5)
WBC: 15.9 10*3/uL — ABNORMAL HIGH (ref 4.0–10.5)
nRBC: 0 % (ref 0.0–0.2)

## 2018-10-14 LAB — BASIC METABOLIC PANEL
Anion gap: 11 (ref 5–15)
BUN: 21 mg/dL (ref 8–23)
CO2: 25 mmol/L (ref 22–32)
Calcium: 9.2 mg/dL (ref 8.9–10.3)
Chloride: 99 mmol/L (ref 98–111)
Creatinine, Ser: 1.11 mg/dL (ref 0.61–1.24)
GFR calc Af Amer: >60 mL/min (ref >60)
GFR calc non Af Amer: >60 mL/min (ref >60)
Glucose, Bld: 160 mg/dL — ABNORMAL HIGH (ref 70–99)
Potassium: 4.9 mmol/L (ref 3.5–5.1)
Sodium: 135 mmol/L (ref 135–145)

## 2018-10-14 LAB — CREATININE, SERUM
Creatinine, Ser: 1.1 mg/dL (ref 0.61–1.24)
GFR calc Af Amer: >60 mL/min (ref >60)
GFR calc non Af Amer: >60 mL/min (ref >60)

## 2018-10-14 MED ORDER — ENOXAPARIN SODIUM 40 MG/0.4ML ~~LOC~~ SOLN: 40.0000 mg | SUBCUTANEOUS | Status: DC

## 2018-10-14 MED ORDER — METHOCARBAMOL 500 MG PO TABS
500.0000 mg | ORAL_TABLET | Freq: Three times a day (TID) | ORAL | Status: DC
  Administered 2018-10-14 – 2018-10-31 (×50): 500 mg via ORAL
  Filled 2018-10-14 (×50): qty 1

## 2018-10-14 MED ORDER — ALPRAZOLAM 0.25 MG PO TABS
2.0000 mg | ORAL_TABLET | Freq: Two times a day (BID) | ORAL | Status: DC
  Administered 2018-10-14 – 2018-10-15 (×3): 2 mg via ORAL
  Filled 2018-10-14: qty 4
  Filled 2018-10-14: qty 8
  Filled 2018-10-14: qty 4

## 2018-10-14 MED ORDER — ENOXAPARIN SODIUM 40 MG/0.4ML ~~LOC~~ SOLN
40.0000 mg | SUBCUTANEOUS | Status: DC
  Administered 2018-10-15 – 2018-10-30 (×16): 40 mg via SUBCUTANEOUS
  Filled 2018-10-14 (×16): qty 0.4

## 2018-10-14 MED ORDER — ASPIRIN EC 325 MG PO TBEC
325.0000 mg | DELAYED_RELEASE_TABLET | Freq: Every day | ORAL | Status: DC
  Administered 2018-10-15 – 2018-10-31 (×17): 325 mg via ORAL
  Filled 2018-10-14 (×17): qty 1

## 2018-10-14 MED ORDER — PREGABALIN 75 MG PO CAPS
75.0000 mg | ORAL_CAPSULE | Freq: Two times a day (BID) | ORAL | Status: DC
  Administered 2018-10-14 – 2018-10-31 (×34): 75 mg via ORAL
  Filled 2018-10-14 (×34): qty 1

## 2018-10-14 MED ORDER — SORBITOL 70 % SOLN
30.0000 mL | Freq: Every day | Status: DC | PRN
  Administered 2018-10-19 – 2018-10-22 (×2): 30 mL via ORAL
  Filled 2018-10-14 (×2): qty 30

## 2018-10-14 MED ORDER — ONDANSETRON HCL 4 MG/2ML IJ SOLN: 4.0000 mg | Freq: Four times a day (QID) | INTRAMUSCULAR | Status: DC | PRN

## 2018-10-14 MED ORDER — ONDANSETRON 4 MG PO TBDP
4.0000 mg | ORAL_TABLET | Freq: Four times a day (QID) | ORAL | Status: DC | PRN
  Filled 2018-10-14: qty 1

## 2018-10-14 MED ORDER — ACETAMINOPHEN 325 MG PO TABS
650.0000 mg | ORAL_TABLET | Freq: Four times a day (QID) | ORAL | Status: DC
  Administered 2018-10-15 – 2018-10-31 (×64): 650 mg via ORAL
  Filled 2018-10-14 (×64): qty 2

## 2018-10-14 MED ORDER — VITAMIN C 500 MG PO TABS
500.0000 mg | ORAL_TABLET | Freq: Every day | ORAL | Status: DC
  Administered 2018-10-15 – 2018-10-31 (×17): 500 mg via ORAL
  Filled 2018-10-14 (×18): qty 1

## 2018-10-14 MED ORDER — SIMETHICONE 80 MG PO CHEW: 80.0000 mg | CHEWABLE_TABLET | Freq: Four times a day (QID) | ORAL | Status: DC | PRN

## 2018-10-14 MED ORDER — BACITRACIN ZINC 500 UNIT/GM EX OINT
TOPICAL_OINTMENT | Freq: Two times a day (BID) | CUTANEOUS | Status: DC
  Administered 2018-10-14 – 2018-10-15 (×3): 31.5556 via TOPICAL
  Administered 2018-10-16: 1 via TOPICAL
  Administered 2018-10-16: 12:00:00 via TOPICAL
  Administered 2018-10-17: 31.5556 via TOPICAL
  Administered 2018-10-17: 07:00:00 via TOPICAL
  Administered 2018-10-18 – 2018-10-20 (×6): 31.5556 via TOPICAL
  Administered 2018-10-21 – 2018-10-22 (×3): via TOPICAL
  Administered 2018-10-22: 1 via TOPICAL
  Administered 2018-10-23: 09:00:00 via TOPICAL
  Administered 2018-10-23: 1 via TOPICAL
  Administered 2018-10-24 – 2018-10-25 (×3): via TOPICAL
  Administered 2018-10-25: 1 via TOPICAL
  Administered 2018-10-26 – 2018-10-29 (×6): 31.5556 via TOPICAL
  Administered 2018-10-29: 1 via TOPICAL
  Administered 2018-10-30 – 2018-10-31 (×3): via TOPICAL
  Filled 2018-10-14: qty 28.35

## 2018-10-14 MED ORDER — TEMAZEPAM 7.5 MG PO CAPS
30.0000 mg | ORAL_CAPSULE | Freq: Every day | ORAL | Status: DC
  Administered 2018-10-14 – 2018-10-30 (×17): 30 mg via ORAL
  Filled 2018-10-14 (×11): qty 4
  Filled 2018-10-14: qty 2
  Filled 2018-10-14 (×5): qty 4

## 2018-10-14 MED ORDER — BOOST / RESOURCE BREEZE PO LIQD CUSTOM
1.0000 | Freq: Three times a day (TID) | ORAL | Status: DC
  Administered 2018-10-14 – 2018-10-29 (×30): 1 via ORAL

## 2018-10-14 MED ORDER — PANTOPRAZOLE SODIUM 40 MG PO TBEC
40.0000 mg | DELAYED_RELEASE_TABLET | Freq: Every day | ORAL | Status: DC
  Administered 2018-10-15 – 2018-10-31 (×17): 40 mg via ORAL
  Filled 2018-10-14 (×17): qty 1

## 2018-10-14 MED ORDER — TRAMADOL HCL 50 MG PO TABS
50.0000 mg | ORAL_TABLET | Freq: Four times a day (QID) | ORAL | Status: DC | PRN
  Administered 2018-10-15: 100 mg via ORAL
  Administered 2018-10-15 (×2): 50 mg via ORAL
  Administered 2018-10-16 (×2): 100 mg via ORAL
  Administered 2018-10-17 – 2018-10-19 (×4): 50 mg via ORAL
  Administered 2018-10-20 – 2018-10-30 (×22): 100 mg via ORAL
  Filled 2018-10-14 (×3): qty 2
  Filled 2018-10-14: qty 1
  Filled 2018-10-14 (×6): qty 2
  Filled 2018-10-14: qty 1
  Filled 2018-10-14 (×3): qty 2
  Filled 2018-10-14 (×2): qty 1
  Filled 2018-10-14: qty 2
  Filled 2018-10-14: qty 1
  Filled 2018-10-14 (×13): qty 2
  Filled 2018-10-14: qty 1
  Filled 2018-10-14 (×2): qty 2
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
## 2018-10-14 NOTE — Progress Notes (Signed)
Patient arrived to IR 4W-16 with all personal belongings. Patient was informed of all IR procedures and practices. Patient states he has pain of a 4 in his leg. Patient is sitting in bed, eating dinner with his wife. Bed alarm on and call bell within reach. Patient was experiencing a nose bleed with some clotting. Patient currently has gauze in left nostril and is using bag of ice.  Kyle Likes, LPN
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
## 2018-10-14 NOTE — Care Management Important Message (Signed)
Important Message  Patient Details  Name: Kyle Mcconnell MRN: [id number] Date of Birth: 02-Jan-1956   Medicare Important Message Given:  Yes    Dorena Bodo 10/14/2018, 4:32 PM

## 2018-10-14 NOTE — Progress Notes (Signed)
Patient ID: Kyle Mcconnell, male   DOB: [birth year], 63 y.o.   MRN: [id number]    5 Days Post-Op  Subjective: Patient with no new complaints.  Moving his bowels some.  Eating well.  Pain well controlled except some in his hip.  Wound VAC removed today by ortho.  Awaiting CAM boot placement.  Objective: Vital signs in last 24 hours: Temp:  [98.1 F (36.7 C)-99.2 F (37.3 C)] 98.9 F (37.2 C) (03/02 0800) Pulse Rate:  [100-115] 102 (03/02 0800) Resp:  [18-26] 20 (03/02 0800) BP: (110-138)/(76-90) 124/84 (03/02 0800) SpO2:  [97 %-100 %] 98 % (03/02 0300) Last BM Date: 10/13/18  Intake/Output from previous day: 03/01 0701 - 03/02 0700 In: 370 [P.O.:360; I.V.:10] Out: 3351 [Urine:3350; Stool:1] Intake/Output this shift: No intake/output data recorded.  PE: HEENT: abrasions to his head are healing well.  Chin lac is healing well as well Heart: regular, mildly tachy in low 100s Lungs: CTAB Abd: soft, NT, ND, +BS Ext: splint off of RLE.  Ace bandage on.  Wiggles both toes Neuro: decrease strength still in RUE; however he is almost able to raise it currently on his own.  Still needs a little assistance with left arm, but improving.  Otherwise grossly intact  Lab Results:  No results for input(s): WBC, HGB, HCT, PLT in the last 72 hours. BMET Recent Labs    10/12/18 0624  NA 136  K 4.2  CL 100  CO2 28  GLUCOSE 125*  BUN 15  CREATININE 0.98  CALCIUM 8.9   PT/INR No results for input(s): LABPROT, INR in the last 72 hours. CMP     Component Value Date/Time   NA 136 10/12/2018 0624   K 4.2 10/12/2018 0624   CL 100 10/12/2018 0624   CO2 28 10/12/2018 0624   GLUCOSE 125 (H) 10/12/2018 0624   BUN 15 10/12/2018 0624   CREATININE 0.98 10/12/2018 0624   CALCIUM 8.9 10/12/2018 0624   PROT 6.6 09/30/2018 1542   ALBUMIN 2.7 (L) 10/05/2018 0432   AST 54 (H) 09/30/2018 1542   ALT 33 09/30/2018 1542   ALKPHOS 58 09/30/2018 1542   BILITOT 0.6 09/30/2018 1542   GFRNONAA >60  10/12/2018 0624   GFRAA >60 10/12/2018 0624   Lipase  No results found for: LIPASE     Studies/Results: No results found.  Anti-infectives: Anti-infectives (From admission, onward)   Start     Dose/Rate Route Frequency Ordered Stop   10/09/18 1400  ceFAZolin (ANCEF) IVPB 2g/100 mL premix  Status:  Discontinued     2 g 200 mL/hr over 30 Minutes Intravenous Every 8 hours 10/09/18 1339 10/09/18 1418   10/09/18 1134  tobramycin (NEBCIN) powder  Status:  Discontinued       As needed 10/09/18 1134 10/09/18 1237   10/09/18 1133  vancomycin (VANCOCIN) powder  Status:  Discontinued       As needed 10/09/18 1133 10/09/18 1237   10/07/18 1030  ceFEPIme (MAXIPIME) 2 g in sodium chloride 0.9 % 100 mL IVPB     2 g 200 mL/hr over 30 Minutes Intravenous Every 12 hours 10/07/18 0945 10/11/18 2359   10/03/18 1800  ceFAZolin (ANCEF) IVPB 2g/100 mL premix     2 g 200 mL/hr over 30 Minutes Intravenous Every 8 hours 10/03/18 1411 10/04/18 1435   10/03/18 1043  vancomycin (VANCOCIN) powder  Status:  Discontinued       As needed 10/03/18 1044 10/03/18 1216   10/02/18 0815  ceFAZolin (ANCEF)  IVPB 2g/100 mL premix  Status:  Discontinued     2 g 200 mL/hr over 30 Minutes Intravenous To ShortStay Surgical 10/02/18 0644 10/02/18 0919   10/02/18 0600  ceFAZolin (ANCEF) IVPB 2g/100 mL premix  Status:  Discontinued     2 g 200 mL/hr over 30 Minutes Intravenous On call to O.R. 10/01/18 0948 10/02/18 0645   10/01/18 0800  ceFAZolin (ANCEF) IVPB 2g/100 mL premix  Status:  Discontinued     2 g 200 mL/hr over 30 Minutes Intravenous To ShortStay Surgical 09/30/18 2350 10/02/18 0645   09/30/18 1900  ceFAZolin (ANCEF) IVPB 2g/100 mL premix     2 g 200 mL/hr over 30 Minutes Intravenous  Once 09/30/18 1857 09/30/18 1947   09/30/18 1900  ceFAZolin (ANCEF) 2-4 GM/100ML-% IVPB    Note to Pharmacy:  Sabino Niemann   : cabinet override      09/30/18 1900 09/30/18 1947   09/30/18 1545  ceFAZolin (ANCEF) IVPB 1 g/50 mL  premix     1 g 100 mL/hr over 30 Minutes Intravenous  Once 09/30/18 1535 09/30/18 1804       Assessment/Plan Hx PTSD-Seroquel Remeron Restoril- preadmit Hypertension-lisinopril.On admit Hepatitis C Anxiety/Depression  [facility] Scalp laceration- repaired in ED2/17 Lower lip laceration- repaired 2/18 Right anklefx- s/pex fix2/17 by Dr. Aundria Rud, ex fix adjustment2/20 byDr. Haddix;ORIF right pilon fracture and right medial malleolus 2/26by Dr. Shelby Dubin wound vac DC today 3/2,NWB RLE, RLE boot,pain control Left acetabulumfs left inferior pubic ramus fx -s/p perc fixation 2/20 Dr.Haddix;NWB LLE, pain control Left internal iliac artery injury - S/P angioembolization by Dr. Lowella Dandy 2/17 T12 Chance fx - MRIcompleted;TLSO brace, no intervention needed at this time per Dr. Wynetta Emery T11 SP fx - per NS ABL anemia- 2 units PRBC on 2/22; stable Thrombocytopenia- Consumptive. resolved AKI/rhabdomyolysis-Cre 2.69-1.80-1.20->0.94->1.03>> 0.98 today - Total CK 9135>>5530>>5186>>4584 Hyperglycemia- Glucose147>>124>>125 today Concussion-Continue therapy, mentation improving. L basal ganglia ischemic infarction- Appreciate Neurology input.Etiology unclear - hypotension versus anemia. ContinueASA.STfollowing, regular diet. CVA team aware of MRI, cont current therapy with ASA and follow up as outpatient. PNA- CXR2/23c/w bilateral PNA, spiking fevers. WBCup to 13.1today, stable. Afebrile.cefipime stopped after 5 day course ID - ancef 2/17>>2/21, Cefipime 2/24 -->2/28, Ancef 2/26 preop FEN -regular diet.Resource breeze; check K today on BID potassium VTE- PAS, lovenox Foley -D/C 2/24,condom cath while on bedrest Dispo- PT/OT, CIR pending, medically stable at this time.   LOS: 14 days    Kyle Mcconnell , [facility] 10/14/2018, 9:33 AM Pager: [phone]
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
## 2018-10-14 NOTE — Progress Notes (Signed)
Subjective: Patient reports some back pain but doing ok. Has gotten up to chair every day and has done well with it.   Objective: Vital signs in last 24 hours: Temp:  [98.1 F (36.7 C)-99.2 F (37.3 C)] 98.9 F (37.2 C) (03/02 0800) Pulse Rate:  [100-115] 113 (03/02 1000) Resp:  [15-26] 15 (03/02 1000) BP: (110-138)/(76-90) 124/84 (03/02 0800) SpO2:  [97 %-100 %] 97 % (03/02 1000)  Intake/Output from previous day: 03/01 0701 - 03/02 0700 In: 370 [P.O.:360; I.V.:10] Out: 3351 [Urine:3350; Stool:1] Intake/Output this shift: No intake/output data recorded.  Neurologic: Grossly normal  Lab Results: Lab Results  Component Value Date   WBC 13.1 (H) 10/11/2018   HGB 8.9 (L) 10/11/2018   HCT 28.7 (L) 10/11/2018   MCV 90.0 10/11/2018   PLT 236 10/11/2018   Lab Results  Component Value Date   INR 1.24 10/01/2018   BMET Lab Results  Component Value Date   NA 136 10/12/2018   K 4.2 10/12/2018   CL 100 10/12/2018   CO2 28 10/12/2018   GLUCOSE 125 (H) 10/12/2018   BUN 15 10/12/2018   CREATININE 0.98 10/12/2018   CALCIUM 8.9 10/12/2018    Studies/Results: No results found.  Assessment/Plan: Working with therapies. Planning to get up in chair.    LOS: 14 days    Tiana Loft [facility] 10/14/2018, 10:34 AM
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
## 2018-10-14 NOTE — Progress Notes (Signed)
Inpatient Rehab Admissions:  I have spoken with MD and have medical clearance to admit patient to CIR today.  Floor RN, RNCM and CSW aware and pt in agreement.  I will admit today.   Stephania Fragmin, PT, DPT Admissions Coordinator 10/14/18 3:01 PM
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
## 2018-10-14 NOTE — Progress Notes (Signed)
Patient cam boot delivered and on RLE.  BMP ordered, PICC flushes but does not draw back blood, IV team consult put in and lab made aware. Lab on the way to draw BMP.

## 2018-10-15 ENCOUNTER — Inpatient Hospital Stay (HOSPITAL_COMMUNITY): Payer: Medicare Other

## 2018-10-15 ENCOUNTER — Inpatient Hospital Stay (HOSPITAL_COMMUNITY): Payer: Federal, State, Local not specified - PPO | Admitting: Speech Pathology

## 2018-10-15 ENCOUNTER — Inpatient Hospital Stay (HOSPITAL_COMMUNITY): Payer: Self-pay | Admitting: Occupational Therapy

## 2018-10-15 ENCOUNTER — Inpatient Hospital Stay (HOSPITAL_COMMUNITY): Payer: Federal, State, Local not specified - PPO | Admitting: Physical Therapy

## 2018-10-15 DIAGNOSIS — T07XXXA Unspecified multiple injuries, initial encounter: Secondary | ICD-10-CM

## 2018-10-15 DIAGNOSIS — D72829 Elevated white blood cell count, unspecified: Secondary | ICD-10-CM

## 2018-10-15 DIAGNOSIS — I639 Cerebral infarction, unspecified: Secondary | ICD-10-CM

## 2018-10-15 DIAGNOSIS — E871 Hypo-osmolality and hyponatremia: Secondary | ICD-10-CM

## 2018-10-15 DIAGNOSIS — M7989 Other specified soft tissue disorders: Secondary | ICD-10-CM

## 2018-10-15 DIAGNOSIS — R7401 Elevation of levels of liver transaminase levels: Secondary | ICD-10-CM

## 2018-10-15 DIAGNOSIS — D62 Acute posthemorrhagic anemia: Secondary | ICD-10-CM

## 2018-10-15 DIAGNOSIS — N179 Acute kidney failure, unspecified: Secondary | ICD-10-CM

## 2018-10-15 DIAGNOSIS — R740 Nonspecific elevation of levels of transaminase and lactic acid dehydrogenase [LDH]: Secondary | ICD-10-CM

## 2018-10-15 DIAGNOSIS — R739 Hyperglycemia, unspecified: Secondary | ICD-10-CM

## 2018-10-15 LAB — CBC WITH DIFFERENTIAL/PLATELET
Abs Immature Granulocytes: 0.3 10*3/uL — ABNORMAL HIGH (ref 0.00–0.07)
Basophils Absolute: 0 10*3/uL (ref 0.0–0.1)
Basophils Relative: 0 %
Eosinophils Absolute: 0.1 10*3/uL (ref 0.0–0.5)
Eosinophils Relative: 1 %
HCT: 34.6 % — ABNORMAL LOW (ref 39.0–52.0)
Hemoglobin: 10.7 g/dL — ABNORMAL LOW (ref 13.0–17.0)
Immature Granulocytes: 2 %
Lymphocytes Relative: 12 %
Lymphs Abs: 1.8 10*3/uL (ref 0.7–4.0)
MCH: 27.9 pg (ref 26.0–34.0)
MCHC: 30.9 g/dL (ref 30.0–36.0)
MCV: 90.1 fL (ref 80.0–100.0)
MONO ABS: 1.6 10*3/uL — AB (ref 0.1–1.0)
MONOS PCT: 10 %
Neutro Abs: 11.3 10*3/uL — ABNORMAL HIGH (ref 1.7–7.7)
Neutrophils Relative %: 75 %
Platelets: 390 10*3/uL (ref 150–400)
RBC: 3.84 MIL/uL — ABNORMAL LOW (ref 4.22–5.81)
RDW: 15 % (ref 11.5–15.5)
WBC: 15.1 10*3/uL — ABNORMAL HIGH (ref 4.0–10.5)
nRBC: 0 % (ref 0.0–0.2)

## 2018-10-15 LAB — COMPREHENSIVE METABOLIC PANEL
ALT: 131 U/L — ABNORMAL HIGH (ref 0–44)
ANION GAP: 9 (ref 5–15)
AST: 102 U/L — ABNORMAL HIGH (ref 15–41)
Albumin: 2.9 g/dL — ABNORMAL LOW (ref 3.5–5.0)
Alkaline Phosphatase: 87 U/L (ref 38–126)
BUN: 30 mg/dL — ABNORMAL HIGH (ref 8–23)
CO2: 25 mmol/L (ref 22–32)
Calcium: 8.5 mg/dL — ABNORMAL LOW (ref 8.9–10.3)
Chloride: 98 mmol/L (ref 98–111)
Creatinine, Ser: 1.22 mg/dL (ref 0.61–1.24)
GFR calc Af Amer: >60 mL/min (ref >60)
GFR calc non Af Amer: >60 mL/min (ref >60)
Glucose, Bld: 145 mg/dL — ABNORMAL HIGH (ref 70–99)
Potassium: 4.6 mmol/L (ref 3.5–5.1)
Sodium: 132 mmol/L — ABNORMAL LOW (ref 135–145)
Total Bilirubin: 1.7 mg/dL — ABNORMAL HIGH (ref 0.3–1.2)
Total Protein: 6.9 g/dL (ref 6.5–8.1)

## 2018-10-15 MED ORDER — ALPRAZOLAM 0.5 MG PO TABS
2.0000 mg | ORAL_TABLET | Freq: Two times a day (BID) | ORAL | Status: DC
  Administered 2018-10-16 – 2018-10-31 (×31): 2 mg via ORAL
  Filled 2018-10-15 (×32): qty 4

## 2018-10-15 MED ORDER — SODIUM CHLORIDE 0.9% FLUSH: 10.0000 mL | INTRAVENOUS | Status: DC | PRN

## 2018-10-15 NOTE — Progress Notes (Signed)
Pt being transported to vascular via bed. Side rails up x 4. Will f/up when return.  Ross Ludwig, LPN

## 2018-10-15 NOTE — Progress Notes (Signed)
Pt return to unit vai bed. Side rails x 3. Call bell in reach. Will cont to monitor.   Ross Ludwig, LPN

## 2018-10-15 NOTE — Progress Notes (Signed)
Speech Language Pathology Daily Session Note  Patient Details  Name: Jodi M Creegan MRN: [id number] Date of Birth: 11/29/1955  Today's Date: 10/15/2018 SLP Individual Time: 1500-1530 SLP Individual Time Calculation (min): 30 min  Short Term Goals: Week 1: SLP Short Term Goal 1 (Week 1): Pt will complete semi-complex problem solving tasks with Mod assist verbal/visual cues. SLP Short Term Goal 2 (Week 1): Pt will demonstrate intellecutal awareness by identifying one of his percieved cognitive deficits with Mod assist verbal/visual cues. SLP Short Term Goal 3 (Week 1): Pt will recall basic information with Mod assist for use of compensatory memory strategies/aids. SLP Short Term Goal 4 (Week 1): Pt will remain 100% intelligible during conversational tasks with Min assist for use of speech intelligibility strategies.  Skilled Therapeutic Interventions: Pt was seen for skilled ST targeting cognition. Pt reported increased lethargy due to intensive therapies and imaging today, however he was agreeable to participate in ST. SLP facilitated session with Mod-Max cues for working memory during a basic money management task. Pt was intermittently aware when he made errors during task, and other times required Min assist for error awareness and correction. Pt was left laying in bed with bed alarm set, call bell within reach and all needs met. Recommend continue per current plan of care.      Pain Pain Assessment Pain Scale: Faces Faces Pain Scale: No hurt  Therapy/Group: Individual Therapy   Erin Smith, Student SLP   Erin Smith 10/15/2018, 3:38 PM   
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
## 2018-10-15 NOTE — Progress Notes (Signed)
Inpatient Rehabilitation  Patient information reviewed and entered into eRehab system by Cantrell Martus M. Rainie Crenshaw, M.A., CCC/SLP, PPS Coordinator.  Information including medical coding, functional ability and quality indicators will be reviewed and updated through discharge.    Per nursing patient was given "Data Collection Information Summary" for Patients in Inpatient Rehabilitation Facilities with attached "Privacy Act Statement-Health Care Records" upon admission.   
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
## 2018-10-15 NOTE — Progress Notes (Signed)
Bilateral lower extremity venous duplex completed. Preliminary results in Chart review CV Proc. [place] [name],RVS 10/15/2018, 12:55 PM
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
## 2018-10-16 ENCOUNTER — Inpatient Hospital Stay (HOSPITAL_COMMUNITY): Payer: Federal, State, Local not specified - PPO | Admitting: Speech Pathology

## 2018-10-16 ENCOUNTER — Inpatient Hospital Stay (HOSPITAL_COMMUNITY): Payer: Federal, State, Local not specified - PPO | Admitting: Occupational Therapy

## 2018-10-16 ENCOUNTER — Inpatient Hospital Stay (HOSPITAL_COMMUNITY): Payer: Federal, State, Local not specified - PPO

## 2018-10-16 ENCOUNTER — Encounter (HOSPITAL_COMMUNITY): Payer: Federal, State, Local not specified - PPO | Admitting: Psychology

## 2018-10-16 DIAGNOSIS — F4312 Post-traumatic stress disorder, chronic: Secondary | ICD-10-CM

## 2018-10-16 MED ORDER — SALINE SPRAY 0.65 % NA SOLN
1.0000 | NASAL | Status: DC | PRN
  Filled 2018-10-16 (×2): qty 44
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
## 2018-10-16 NOTE — Progress Notes (Signed)
Occupational Therapy Session Note  Patient Details  Name: Kyle Mcconnell MRN: [id number] Date of Birth: March 30, 1956  Today's Date: 10/16/2018 OT Individual Time: 1030-1130 OT Individual Time Calculation (min): 60 min    Short Term Goals: Week 1:  OT Short Term Goal 1 (Week 1): Pt will complete basic sliding board transfer with +1 assist in order to decrease caregiver burden OT Short Term Goal 2 (Week 1): Pt will complete toileting task on BSC/padded tub bench with +1 assist in order to decrease caregiver burden OT Short Term Goal 3 (Week 1): Pt will independently recall all WBing and orthosis requirements in prep for functional tasks OT Short Term Goal 4 (Week 1): Pt will complete bed mobility during self care task with +1 assist in order to decrease caregiver burden OT Short Term Goal 5 (Week 1): Pt will complete LB dressing with max A using AE PRN  Skilled Therapeutic Interventions/Progress Updates:    Pt seen for OT session focusing on functional transfers and ADL re-training. Pt in supine upon arrival with "caregiver" present. Pt agreeable to tx session and denying pain at rest. Complaints of pain at end of session with mobility, RN made aware and administered medication at end of session.  Discussed at length with pt and caregiver regarding DME recommendations for d/c including hospital bed, w/c, padded tub bench with cut out, need for ramp. Will provide family with w/c home measurement sheet.  TLSO donned in supine with +2 assist, rolling with max A using hospital bed functions and VCs for WBing and technique. He transferred to sitting EOB with max A +1. Completed sliding board transfer EOB<> padded tub bench with cut out in prep for toileting task. Required max A +2,  Pt able to assist once able to reach armrest of bench to assist with pulling self into seat. Completed lateral leans with assist and multimodal cuing for technique for clothing management to be completed with total A. Rest  breaks required throughout.  Pt returned to EOB in same manner as described above. Pt positioned in supine and left with all needs in reach, bed alarm on and caregiver present. RN present to administer pain medications.   Therapy Documentation Precautions:  Precautions Precautions: Fall, Back Required Braces or Orthoses: Spinal Brace, Other Brace Spinal Brace: Thoracolumbosacral orthotic, Applied in sitting position Other Brace: R CAM boot Restrictions Weight Bearing Restrictions: Yes RLE Weight Bearing: Non weight bearing LLE Weight Bearing: Non weight bearing   Therapy/Group: Individual Therapy  Deklan Minar L 10/16/2018, 6:46 AM

## 2018-10-16 NOTE — Progress Notes (Addendum)
Physical Therapy Session Note  Patient Details  Name: Kyle Mcconnell MRN: [id number] Date of Birth: Aug 31, 1955  Today's Date: 10/16/2018 PT Individual Time: 1415-1525 PT Individual Time Calculation (min): 70 min   Short Term Goals: Week 1:  PT Short Term Goal 1 (Week 1): Patient will tolerate static and dynamic sitting for >10 with CGA for safety during functional activities while maintaining all precautions to improve sitting tolerance with daily activities.  PT Short Term Goal 2 (Week 1): Patient will perform log rolling L and R with mod A while maintaining all precautions to improve independence with bed mobility.  PT Short Term Goal 3 (Week 1): Patient will perform supine to/from sit with mod A of 1 person while maintaining all precautions to imporve functional mobility. PT Short Term Goal 4 (Week 1): Patient will perform transfers with least restrictive assistive device with max A of 1 person while maintaining all precautions to improve functional mobility.  PT Short Term Goal 5 (Week 1): Patient will demonstrate scooting back into the w/c and pressure relief techniques with supervision for improved positioning and to maintain skin integrity.   Skilled Therapeutic Interventions/Progress Updates:   Pt eating lunch, so tx delayed for 15 minutes.  Pt resting in bed. PT discussed pt's mother's ramp with pt and his SO.  They state that it goes from garage into mudroom.  There is a threshold at end of ramp.   Pt unable to state any spinal precautions. PT demonstrated and discussed them.  10 minutes later, pt able to state 1/3, twisting.  PT donned TSLO with pt in supine and side lying, front piece separated from back piece (all straps disconnected) for ease of donning. Min assist for rolling and cues for log rolling. PT donned R CAM boot.   L side lying> sitting with max assist and use of bed features to raise trunk as bil LEs were lowered.   Pt scooting forward with mod assist.  Slide  board transfer with max assist bed> w/c to R, level, with max assist.  Pt propelled w/c x 30' with supervision and cues.  Pt locked/unlocked bil brakes with extra time and mod cues.  PT switched bil ELRs to longer ones to accomodate pt's body habitus.  He felt more comfortable with the change.     Pt stated that the TLSO presses into his L hip/thigh.  PT loosened the straps on L slightly, which relieved pressure.  Pt would benefit from assessment by [facility] with pt in sitting.  PT informed Ed, RN who will inform [place], LPN.   Pt stated that he would sit up for 30-60 minutes.  Pt left resting in w/c with seat belt alarm set, needs at hand and SO with him     Therapy Documentation Precautions:  Precautions Precautions: Fall, Back Required Braces or Orthoses: Spinal Brace, Other Brace Spinal Brace: Thoracolumbosacral orthotic, Applied in sitting position Other Brace: R CAM boot Restrictions Weight Bearing Restrictions: Yes RLE Weight Bearing: Non weight bearing LLE Weight Bearing: Non weight bearing     Pain: 2/10 L ankle at rest; 8/10 L hip in sitting in w/c; premedicated and TLSO adjusted       Therapy/Group: Individual Therapy  Kyle Mcconnell 10/16/2018, 4:34 PM

## 2018-10-16 NOTE — Progress Notes (Signed)
Orthopedic Tech Progress Note Patient Details:  [name] 1956-07-07 [id number] Called in brace order Patient ID: Kyle Mcconnell, male   DOB: [birth year], 63 y.o.   MRN: [id number]   [name] 10/16/2018, 12:21 PM
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
## 2018-10-16 NOTE — Progress Notes (Signed)
Speech Language Pathology Daily Session Note  Patient Details  Name: Kyle Mcconnell MRN: [id number] Date of Birth: 11-20-1955  Today's Date: 10/16/2018 SLP Individual Time: 0900-1000 SLP Individual Time Calculation (min): 60 min  Short Term Goals: Week 1: SLP Short Term Goal 1 (Week 1): Pt will complete semi-complex problem solving tasks with Mod assist verbal/visual cues. SLP Short Term Goal 2 (Week 1): Pt will demonstrate intellecutal awareness by identifying one of his percieved cognitive deficits with Mod assist verbal/visual cues. SLP Short Term Goal 3 (Week 1): Pt will recall basic information with Mod assist for use of compensatory memory strategies/aids. SLP Short Term Goal 4 (Week 1): Pt will remain 100% intelligible during conversational tasks with Min assist for use of speech intelligibility strategies.  Skilled Therapeutic Interventions: Pt was seen for skilled ST targeting cognition. He expressed motivation to participate in therapies today. Pt read medication labels and found relevant information on such labels with supervision verbal cues for attention to detail. When creating a list of his current medications, pt required intermittent Min assist verbal cues to recall purpose of approximately half of his current medications. Pt interpreted medication instructions and organized a QD pill box with intermittent Min assist verbal cues for error detection, which he subsequently corrected without further assistance. SLP educated pt regarding importance of 24/7 supervision and assistance with medication management upon his return home and the safety implications of under and/or over medicating. Pt was left laying in bed with bed alarm set, call bell within reach, and all needs met at this time. Recommend continue per current plan of care.      Pain Pain Assessment Pain Score: 0-No pain  Therapy/Group: Individual Therapy   Jettie Booze, Student SLP   Jettie Booze 10/16/2018, 10:24  AM

## 2018-10-17 ENCOUNTER — Inpatient Hospital Stay (HOSPITAL_COMMUNITY): Payer: Federal, State, Local not specified - PPO

## 2018-10-17 ENCOUNTER — Inpatient Hospital Stay (HOSPITAL_COMMUNITY): Payer: Federal, State, Local not specified - PPO | Admitting: Speech Pathology

## 2018-10-17 ENCOUNTER — Inpatient Hospital Stay (HOSPITAL_COMMUNITY): Payer: Federal, State, Local not specified - PPO | Admitting: Occupational Therapy

## 2018-10-17 DIAGNOSIS — N5089 Other specified disorders of the male genital organs: Secondary | ICD-10-CM

## 2018-10-17 DIAGNOSIS — R7303 Prediabetes: Secondary | ICD-10-CM

## 2018-10-17 LAB — COMPREHENSIVE METABOLIC PANEL
ALT: 131 U/L — ABNORMAL HIGH (ref 0–44)
AST: 97 U/L — ABNORMAL HIGH (ref 15–41)
Albumin: 3 g/dL — ABNORMAL LOW (ref 3.5–5.0)
Alkaline Phosphatase: 97 U/L (ref 38–126)
Anion gap: 12 (ref 5–15)
BUN: 28 mg/dL — ABNORMAL HIGH (ref 8–23)
CO2: 23 mmol/L (ref 22–32)
Calcium: 9.1 mg/dL (ref 8.9–10.3)
Chloride: 97 mmol/L — ABNORMAL LOW (ref 98–111)
Creatinine, Ser: 1.22 mg/dL (ref 0.61–1.24)
GFR calc Af Amer: >60 mL/min (ref >60)
GFR calc non Af Amer: >60 mL/min (ref >60)
Glucose, Bld: 121 mg/dL — ABNORMAL HIGH (ref 70–99)
Potassium: 4.7 mmol/L (ref 3.5–5.1)
Sodium: 132 mmol/L — ABNORMAL LOW (ref 135–145)
Total Bilirubin: 1.4 mg/dL — ABNORMAL HIGH (ref 0.3–1.2)
Total Protein: 7.4 g/dL (ref 6.5–8.1)

## 2018-10-17 LAB — CBC WITH DIFFERENTIAL/PLATELET
Abs Immature Granulocytes: 0.19 10*3/uL — ABNORMAL HIGH (ref 0.00–0.07)
Basophils Absolute: 0 10*3/uL (ref 0.0–0.1)
Basophils Relative: 0 %
EOS PCT: 1 %
Eosinophils Absolute: 0.1 10*3/uL (ref 0.0–0.5)
HCT: 33.3 % — ABNORMAL LOW (ref 39.0–52.0)
Hemoglobin: 10.7 g/dL — ABNORMAL LOW (ref 13.0–17.0)
Immature Granulocytes: 2 %
Lymphocytes Relative: 19 %
Lymphs Abs: 1.9 10*3/uL (ref 0.7–4.0)
MCH: 28.5 pg (ref 26.0–34.0)
MCHC: 32.1 g/dL (ref 30.0–36.0)
MCV: 88.8 fL (ref 80.0–100.0)
Monocytes Absolute: 1.3 10*3/uL — ABNORMAL HIGH (ref 0.1–1.0)
Monocytes Relative: 14 %
Neutro Abs: 6.2 10*3/uL (ref 1.7–7.7)
Neutrophils Relative %: 64 %
Platelets: 369 10*3/uL (ref 150–400)
RBC: 3.75 MIL/uL — AB (ref 4.22–5.81)
RDW: 14.6 % (ref 11.5–15.5)
WBC: 9.7 10*3/uL (ref 4.0–10.5)
nRBC: 0 % (ref 0.0–0.2)

## 2018-10-17 LAB — HEMOGLOBIN A1C
Hgb A1c MFr Bld: 6 % — ABNORMAL HIGH (ref 4.8–5.6)
Mean Plasma Glucose: 125.5 mg/dL
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
## 2018-10-17 NOTE — Progress Notes (Signed)
[facility] Individual Statement of Services  Patient Name:  Kyle Mcconnell  Date:  10/17/2018  Welcome to the [facility].  Our goal is to provide you with an individualized program based on your diagnosis and situation, designed to meet your specific needs.  With this comprehensive rehabilitation program, you will be expected to participate in at least 3 hours of rehabilitation therapies Monday-Friday, with modified therapy programming on the weekends.  Your rehabilitation program will include the following services:  Physical Therapy (PT), Occupational Therapy (OT), Speech Therapy (ST), 24 hour per day rehabilitation nursing, Neuropsychology, Case Management (Social Worker), Rehabilitation Medicine, Nutrition Services and Pharmacy Services  Weekly team conferences will be held on Wednesdays to discuss your progress.  Your Social Worker will talk with you frequently to get your input and to update you on team discussions.  Team conferences with you and your family in attendance may also be held.  Expected length of stay:  2 to 3 weeks  Overall anticipated outcome:  Minimal assistance with moderate assistance for car transfers  Depending on your progress and recovery, your program may change. Your Social Worker will coordinate services and will keep you informed of any changes. Your Social Worker's name and contact numbers are listed  below.  The following services may also be recommended but are not provided by the [facility]:   Driving Evaluations  Home Health Rehabiltiation Services  Outpatient Rehabilitation Services   Arrangements will be made to provide these services after discharge if needed.  Arrangements include referral to agencies that provide these services.  Your insurance has been verified to be:  Medicare and Federal [employer] and [employer] Your primary doctor is:  Dr. Concha Pyo and Dr. Teodora Medici  Pertinent information will be shared with your doctor and your insurance company.  Social Worker:  Staci Acosta, LCSW  [phone] or [phone]  Information discussed with and copy given to patient by: Elvera Lennox, 10/17/2018, 10:26 AM

## 2018-10-17 NOTE — Progress Notes (Signed)
Speech Language Pathology Daily Session Note  Patient Details  Name: Kyle Mcconnell MRN: [id number] Date of Birth: [birth year]  Today's Date: 10/17/2018 SLP Individual Time: 0915-1000 SLP Individual Time Calculation (min): 45 min  Short Term Goals: Week 1: SLP Short Term Goal 1 (Week 1): Pt will complete semi-complex problem solving tasks with Mod assist verbal/visual cues. SLP Short Term Goal 2 (Week 1): Pt will demonstrate intellecutal awareness by identifying one of his percieved cognitive deficits with Mod assist verbal/visual cues. SLP Short Term Goal 3 (Week 1): Pt will recall basic information with Mod assist for use of compensatory memory strategies/aids. SLP Short Term Goal 4 (Week 1): Pt will remain 100% intelligible during conversational tasks with Min assist for use of speech intelligibility strategies.  Skilled Therapeutic Interventions: Pt was seen for skilled ST focused on family education and cognition. Session began late due to toileting in progress upon arrival; pt subsequently missed 15 minutes of ST. Pt's fiance was present and supportive throughout session. She reported pt's speech is perceived to be at baseline, however she reported pt's processing speed and thought organization has seemed intermittently impaired from baseline, particularly when pt is fatigued. SLP discussed pt's ST goals while in CIR and anticipated need for 24/7 supervision. Pt engaged in conversation regarding new discharge plans to return home with his fiance (as opposed to his mother's house). SLP also facilitated session with Mod verbal cues for error detection and Min verbal cues for problem solving during a semi-complex logic word puzzle. Pt exhibited impulsivity throughout task, attempting to complete parts of puzzle before taking into account directions. Pt was left in bed with bed alarm set, call bell within reach, and all needs met. Recommend continue per current plan of care.      Pain Pain  Assessment Pain Scale: Faces Pain Score: 0-No pain Faces Pain Scale: No hurt  Therapy/Group: Individual Therapy   Jettie Booze, Student SLP   Jettie Booze 10/17/2018, 10:58 AM

## 2018-10-17 NOTE — Progress Notes (Signed)
Notified ortho tech for needs of brace trim d/t too long and replacement pads. Stated will contact [employer]   Ross Ludwig, LPN

## 2018-10-17 NOTE — Progress Notes (Signed)
Occupational Therapy Session Note  Patient Details  Name: Kyle Mcconnell MRN: [id number] Date of Birth: 23-Mar-1956  Today's Date: 10/17/2018 OT Individual Time: 7253-6644 OT Individual Time Calculation (min): 75 min    Short Term Goals: Week 1:  OT Short Term Goal 1 (Week 1): Pt will complete basic sliding board transfer with +1 assist in order to decrease caregiver burden OT Short Term Goal 2 (Week 1): Pt will complete toileting task on BSC/padded tub bench with +1 assist in order to decrease caregiver burden OT Short Term Goal 3 (Week 1): Pt will independently recall all WBing and orthosis requirements in prep for functional tasks OT Short Term Goal 4 (Week 1): Pt will complete bed mobility during self care task with +1 assist in order to decrease caregiver burden OT Short Term Goal 5 (Week 1): Pt will complete LB dressing with max A using AE PRN  Skilled Therapeutic Interventions/Progress Updates:    Pt seen for OT ADL bathing/dressing session. PT in supine upon arrival with RN present. Pt voicing pain 8/10, reports RN just administered pain medication and pt willing to cont as able.  Completed UB/LB bathing/dressing from bed level. Attempted positioning of LEs into modified circle sit position. Pt unable to tolerate positioning of LE close enough to him to be able to reach B feet, however, was able to wash down to knees, therapist assisted with lower aspects. Pt introduced to reacher, therapist demonstrating technique for use to thread LEs into pants and pt able to return demonstrate with min A. Pt able to roll this session with min-mod A using hospital bed rails with VCs for technique and ensuring maintaining of NWBing pre-cautions.  UB bathing/dressing completed from supine level. Pt able to wash UEs with set-up/supervision, total A to wash back and mod A for UB dressing and donning of TLSO total A. He transferred to sitting EOB with max A +1. Completed sliding board transfer to w/c with  max A +1 with VCs for hand placement and head/hip relationship. Pt left seated in w/c at end of session with caregiver present to assist with grooming tasks at the sink. Reviewed use of call bell for assist with mobility transfers.  Pt with concerns regarding scrotal swelling, RN and MD aware. Recommending order of jock strap for support and safety during sliding board transfers, pt agreeable and MD to order.   Pt able to recall 1/3 back pre-cautions today despite multi-modal cuing for recall. With increased time able to recall NWBing precautions.   Therapy Documentation Precautions:  Precautions Precautions: Fall, Back Required Braces or Orthoses: Spinal Brace, Other Brace Spinal Brace: Thoracolumbosacral orthotic, Applied in sitting position Other Brace: R CAM boot Restrictions Weight Bearing Restrictions: Yes RLE Weight Bearing: Non weight bearing LLE Weight Bearing: Non weight bearing   Therapy/Group: Individual Therapy  Timmey Lamba L 10/17/2018, 6:55 AM

## 2018-10-17 NOTE — Progress Notes (Signed)
Physical Therapy Session Note  Patient Details  Name: Kyle Mcconnell MRN: [id number] Date of Birth: 04-12-1956  Today's Date: 10/17/2018 PT Individual Time: 1415-1515 PT Individual Time Calculation (min): 60 min   Short Term Goals: Week 1:  PT Short Term Goal 1 (Week 1): Patient will tolerate static and dynamic sitting for >10 with CGA for safety during functional activities while maintaining all precautions to improve sitting tolerance with daily activities.  PT Short Term Goal 2 (Week 1): Patient will perform log rolling L and R with mod A while maintaining all precautions to improve independence with bed mobility.  PT Short Term Goal 3 (Week 1): Patient will perform supine to/from sit with mod A of 1 person while maintaining all precautions to imporve functional mobility. PT Short Term Goal 4 (Week 1): Patient will perform transfers with least restrictive assistive device with max A of 1 person while maintaining all precautions to improve functional mobility.  PT Short Term Goal 5 (Week 1): Patient will demonstrate scooting back into the w/c and pressure relief techniques with supervision for improved positioning and to maintain skin integrity.   Skilled Therapeutic Interventions/Progress Updates:   Pt resting in bed.  TLSO is being adjusted, so tx was bedside.  PT educated SO and pt's dtr in donning and doffing R PRAFO.  Safety plan updated for this.  Education regarding spinal precautions: pt able to state 2/3 precautions.  20 minutes later pt able to state 1/3, the one he could not remember earlier.  PT also reiterated with pt and family that NWBing is important until fxs heal somewhat and surgeon OKs any wt bearing.  They verbalized understanding.  Supine neuro re-ed via demo, multimodal cues for R/L:  straight leg raises, short arc quad knee extension, hip abduction/adduction, quad sets, ankle pumps.    Cues for pt to avoid LE wt bearing and allow PT to repostiion him in bed using  bed features. Log rolling in flat bed to place bed pad under him with min assist for rolling L/R. Hips lag behind upper body.  Pt left resting in bed with needs at hand and alarm set, pillow under calves to elevate heels and extend knees.     Therapy Documentation Precautions:  Precautions Precautions: Fall, Back Required Braces or Orthoses: Spinal Brace, Other Brace Spinal Brace: Thoracolumbosacral orthotic, Applied in sitting position Other Brace: R CAM boot Restrictions Weight Bearing Restrictions: Yes RLE Weight Bearing: Non weight bearing LLE Weight Bearing: Non weight bearing  Pain: Pain Assessment Pain Scale: Faces Pain Score: 8  Faces Pain Scale: No hurt Pain Type: Acute pain Pain Location: Back Pain Orientation: Lower Pain Descriptors / Indicators: Aching Pain Frequency: Constant Pain Onset: On-going Patients Stated Pain Goal: 5 Pain Intervention(s): Medication (See eMAR)       Therapy/Group: Individual Therapy  Merrick Feutz 10/17/2018, 4:40 PM

## 2018-10-17 NOTE — Progress Notes (Signed)
Orthopedic Tech Progress Note Patient Details:  Kyle Mcconnell 1956/07/20 [id number] Therapy called and requested a trimming of CLAMSHELL and some replacement padding. Patient ID: Kyle Mcconnell, male   DOB: Jun 09, 1956, 63 y.o.   MRN: [id number]   [name] 10/17/2018, 10:16 AM

## 2018-10-17 NOTE — Progress Notes (Signed)
Speech Language Pathology Daily Session Note  Patient Details  Name: Kyle Mcconnell MRN: [id number] Date of Birth: 01-27-1956  Today's Date: 10/17/2018 SLP Individual Time: 4854-6270 SLP Individual Time Calculation (min): 18 min  Short Term Goals: Week 1: SLP Short Term Goal 1 (Week 1): Pt will complete semi-complex problem solving tasks with Mod assist verbal/visual cues. SLP Short Term Goal 2 (Week 1): Pt will demonstrate intellecutal awareness by identifying one of his percieved cognitive deficits with Mod assist verbal/visual cues. SLP Short Term Goal 3 (Week 1): Pt will recall basic information with Mod assist for use of compensatory memory strategies/aids. SLP Short Term Goal 4 (Week 1): Pt will remain 100% intelligible during conversational tasks with Min assist for use of speech intelligibility strategies.  Skilled Therapeutic Interventions: Pt was seen for skilled ST targeting cognition. Pt's fiance and daughter were at bedside. SLP facilitated session with Mod A verbal cues for impulsivity, recall of directions, and problem solving during a novel semi-complex card game. Pt also required Mod cues for error awareness throughout activity. Pt's awareness of cognitive deficits continues to be decreased, as he reported no difficulty with this activity despite consistent Mod level assist from SLP throughout. Pt was left laying in bed with bed alarm set, call bell within reach, and family at bedside. Recommend continue per current plan of care.       Pain Pain Assessment Pain Scale: Faces Pain Score: 9  Faces Pain Scale: No hurt Pain Type: Acute pain Pain Location: Back Pain Orientation: Lower Pain Descriptors / Indicators: Aching Pain Frequency: Constant Pain Onset: On-going Patients Stated Pain Goal: 5 Pain Intervention(s): Medication (See eMAR)  Therapy/Group: Individual Therapy   Suzzette Righter, Student SLP   Suzzette Righter 10/17/2018, 1:22 PM

## 2018-10-18 ENCOUNTER — Inpatient Hospital Stay (HOSPITAL_COMMUNITY): Payer: Federal, State, Local not specified - PPO | Admitting: Speech Pathology

## 2018-10-18 ENCOUNTER — Inpatient Hospital Stay (HOSPITAL_COMMUNITY): Payer: Federal, State, Local not specified - PPO | Admitting: Occupational Therapy

## 2018-10-18 ENCOUNTER — Inpatient Hospital Stay (HOSPITAL_COMMUNITY): Payer: Federal, State, Local not specified - PPO
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
## 2018-10-18 NOTE — Progress Notes (Signed)
Physical Therapy Session Note  Patient Details  Name: Kyle Mcconnell MRN: [id number] Date of Birth: October 14, 1955  Today's Date: 10/18/2018 PT Individual Time: 1003-1057 PT Individual Time Calculation (min): 54 min   Short Term Goals: Week 1:  PT Short Term Goal 1 (Week 1): Patient will tolerate static and dynamic sitting for >10 with CGA for safety during functional activities while maintaining all precautions to improve sitting tolerance with daily activities.  PT Short Term Goal 2 (Week 1): Patient will perform log rolling L and R with mod A while maintaining all precautions to improve independence with bed mobility.  PT Short Term Goal 3 (Week 1): Patient will perform supine to/from sit with mod A of 1 person while maintaining all precautions to imporve functional mobility. PT Short Term Goal 4 (Week 1): Patient will perform transfers with least restrictive assistive device with max A of 1 person while maintaining all precautions to improve functional mobility.  PT Short Term Goal 5 (Week 1): Patient will demonstrate scooting back into the w/c and pressure relief techniques with supervision for improved positioning and to maintain skin integrity.   Skilled Therapeutic Interventions/Progress Updates:     Patient in bed with nursing staff in room set up for a transfer to the bedside commode using the Maxi move upon PT arrival. Patient alert and agreeable to PT session. Patient expressed frustration with the time it takes to don his braces and use the lift in order for him to go to the bathroom. Patient was educated on the reason for the use of the braces and lift at this time and on his therapy goals to progress to using the sliding board with increased independence instead of the lift.  Patient able to recall 2/3 spinal precautions during session and stated he could put minimal weight through his legs. Continued to educate on spinal precautions and NWB throughout session.   Therapeutic  Activity: Bed Mobility: Patient rolling to the L with mod A using bed rail. Provided verbal cues for log rolling to maintain spinal precautions. Transfers: Patient performed total A transfer with the Maxi move to the bedside commode x1 and slide board transfers from the bed side commode to the w/c x1 with mod A of 2 and to/from the mat table with mat table elevated when returning from the w/c with Mod A of 1 and a second person for safety with CGA. Provided verbal cues and demonstration for sequencing, board placement, head/hips relationship, hand placement, and maintaining spinal and B NWB precautions.   Wheelchair Mobility:  Patient propelled wheelchair 90 feet with supervision. Provided verbal cues for technique for making tighter turns and coordinating both UE together for longer strokes for propulsion.  Patient in w/c at end of session with breaks locked, seatbelt release alarm set, and all needs within reach. Educated patient on staying in the w/c for 30-60 min for improved sitting tolerance   Therapy Documentation Precautions:  Precautions Precautions: Fall, Back Required Braces or Orthoses: Spinal Brace, Other Brace Spinal Brace: Thoracolumbosacral orthotic, Applied in sitting position Other Brace: R CAM boot Restrictions Weight Bearing Restrictions: Yes RLE Weight Bearing: Non weight bearing LLE Weight Bearing: Non weight bearing    Pain: Pain Assessment Pain Scale: 0-10 Pain Score: 10-Worst pain ever Pain Type: Acute pain Pain Location: Back Pain Orientation: Lower Pain Descriptors / Indicators: Aching Pain Frequency: Constant Pain Intervention(s): Repositioned   Therapy/Group: Individual Therapy  Helayne Seminole, PT, DPT 10/18/2018, 12:15 PM

## 2018-10-18 NOTE — Progress Notes (Signed)
Occupational Therapy Session Note  Patient Details  Name: Kyle Mcconnell MRN: [id number] Date of Birth: 1956-03-18  Today's Date: 10/18/2018 OT Individual Time: 9379-0240 OT Individual Time Calculation (min): 75 min    Short Term Goals: Week 1:  OT Short Term Goal 1 (Week 1): Pt will complete basic sliding board transfer with +1 assist in order to decrease caregiver burden OT Short Term Goal 2 (Week 1): Pt will complete toileting task on BSC/padded tub bench with +1 assist in order to decrease caregiver burden OT Short Term Goal 3 (Week 1): Pt will independently recall all WBing and orthosis requirements in prep for functional tasks OT Short Term Goal 4 (Week 1): Pt will complete bed mobility during self care task with +1 assist in order to decrease caregiver burden OT Short Term Goal 5 (Week 1): Pt will complete LB dressing with max A using AE PRN  Skilled Therapeutic Interventions/Progress Updates:    Pt seen for OT ADL bathing/dressing session and session focusing on functional transfers. Pt in supine upon arrival with RN present in prep for assisting pt with transfer to [facility], hand off to OT.  Pt completed min-mod A transfer to sitting EOB. MAx- total A sliding board transfer to padded tub bench with cut out. +2 for safety and total A for clothing management once on BSC, laterally leaning to R/L in order to pull down underwear. Pt unsucccesful in attempt to void. Transferred back to EOB via slide board with max A and +2 for safety to stabilize equipment.  He completed LB bathing/dressing from bed level. Instructed in use of LH sponge and able to recall use of reacher to thread pants with min A for clothing management. He rolled with min A using hospital bed functions for pants to be pulled up total A. He required VCs for sequencing of bathing/dressing routine from bed level. Min A and total A for set-up of LB bathing task.  Pt left set-up to complete UB bathing at bed level, RN made aware and  to assist with remainder of dressing task. Pt left in supine with all needs in reach and bed alarm on. Extensive education/discussion with pt and pt's caregiver regarding d/c disposition. Need for ramp and concerns about home accessibility due to size of pt's home. Pt's caregiver provided with home measurement sheet. She plans to go to pt's house today to take pictures and measurements of house. Pt will require ramp, hospital bed, BSC and w/c at d/c.  Therapy Documentation Precautions:  Precautions Precautions: Fall, Back Required Braces or Orthoses: Spinal Brace, Other Brace Spinal Brace: Thoracolumbosacral orthotic, Applied in sitting position Other Brace: R CAM boot Restrictions Weight Bearing Restrictions: Yes RLE Weight Bearing: Non weight bearing LLE Weight Bearing: Non weight bearing      Therapy/Group: Individual Therapy  Melania Kirks L 10/18/2018, 6:48 AM

## 2018-10-18 NOTE — Progress Notes (Signed)
Speech Language Pathology Daily Session Note  Patient Details  Name: Kyle Mcconnell MRN: [id number] Date of Birth: Sep 17, 1955  Today's Date: 10/18/2018 SLP Individual Time: 2620-3559 SLP Individual Time Calculation (min): 59 min  Short Term Goals: Week 1: SLP Short Term Goal 1 (Week 1): Pt will complete semi-complex problem solving tasks with Mod assist verbal/visual cues. SLP Short Term Goal 2 (Week 1): Pt will demonstrate intellecutal awareness by identifying one of his percieved cognitive deficits with Mod assist verbal/visual cues. SLP Short Term Goal 3 (Week 1): Pt will recall basic information with Mod assist for use of compensatory memory strategies/aids. SLP Short Term Goal 4 (Week 1): Pt will remain 100% intelligible during conversational tasks with Min assist for use of speech intelligibility strategies. SLP Short Term Goal 4 - Progress (Week 1): Discontinued (comment)(n/a)  Skilled Therapeutic Interventions: Pt was seen for skilled ST targeting cognition. SLP facilitated session with Supervision-Min verbal cues for error awareness in 3 out of 10 calculations given a specific money scenario. SLP also facilitated session with a novel memory card task, during which pt required intermittent Min assist for recall and use of memory strategy (word associations). Of note, pt's speech intelligibility has increased to 100% across last 2 sessions and believed to be back to baseline without intervention. Pt was left laying in bed with bed alarm set, call bell within reach, and all needs met. Recommend continue per current plan of care.      Pain Pain Assessment Pain Scale: Faces Faces Pain Scale: No hurt  Therapy/Group: Individual Therapy   Jettie Booze, Student SLP   Jettie Booze 10/18/2018, 3:43 PM
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
## 2018-10-18 NOTE — Progress Notes (Signed)
Social Work Patient ID: Kyle Mcconnell, male   DOB: [birth year], 63 y.o.   MRN: [id number]   CSW met with pt and his fiance and talked with pt's dtr, Tna, on the phone on 10-16-18 after conference to give them conference update and targeted d/c date of 10-31-18.  All were pleased to learn of this and family has already discussed pt's d/c plan to include him going to his mother's ramped home with family members coming there to care for him.  CSW will continue to follow and assist as needed.

## 2018-10-19 ENCOUNTER — Inpatient Hospital Stay (HOSPITAL_COMMUNITY): Payer: Federal, State, Local not specified - PPO | Admitting: Physical Therapy

## 2018-10-19 ENCOUNTER — Inpatient Hospital Stay (HOSPITAL_COMMUNITY): Payer: Federal, State, Local not specified - PPO

## 2018-10-19 NOTE — Progress Notes (Signed)
Occupational Therapy Session Note  Patient Details  Name: Kyle Mcconnell MRN: [id number] Date of Birth: Jan 03, 1956  Today's Date: 10/19/2018 OT Individual Time: 1300-1408 OT Individual Time Calculation (min): 68 min    Short Term Goals: Week 1:  OT Short Term Goal 1 (Week 1): Pt will complete basic sliding board transfer with +1 assist in order to decrease caregiver burden OT Short Term Goal 2 (Week 1): Pt will complete toileting task on BSC/padded tub bench with +1 assist in order to decrease caregiver burden OT Short Term Goal 3 (Week 1): Pt will independently recall all WBing and orthosis requirements in prep for functional tasks OT Short Term Goal 4 (Week 1): Pt will complete bed mobility during self care task with +1 assist in order to decrease caregiver burden OT Short Term Goal 5 (Week 1): Pt will complete LB dressing with max A using AE PRN  Skilled Therapeutic Interventions/Progress Updates:    Session focused on ADL transfers, core stability, and B UE strengthening to increase ease with transfers during B LE NWB. Pt completed bed mobility rolling R and L for TLSO to be donned with total A. Pt transitioned to EOB using log rolling technique, with mod A. Cam boot donned to R LE. 100% verbal recall of back and NWB precautions. Pt required mod A to transfer into w/c, +2 support for managing LE and w/c. Pt propelled w/c into therapy gym, 140 ft with several rest breaks. Pt completed slideboard transfer to mat with CGA only! While sitting EOM pt completed tricep push ups 3 sets of 12 with cueing for technique/encouragement. Pt then completed B UE coordination task with throwing/catching ball, with engagement of core muscles by reaching outside of COG. Pt reported fatigue and requested to return to room. Pt completed 2 more slideboard transfers with CGA only! Pt left supine with all needs met.   Therapy Documentation Precautions:  Precautions Precautions: Fall, Back Required Braces or  Orthoses: Spinal Brace, Other Brace Spinal Brace: Thoracolumbosacral orthotic, Applied in sitting position Other Brace: R CAM boot Restrictions Weight Bearing Restrictions: Yes RLE Weight Bearing: Non weight bearing LLE Weight Bearing: Non weight bearing   Pain: Pain Assessment Pain Score: 7    Therapy/Group: Individual Therapy  Curtis Sites 10/19/2018, 2:12 PM

## 2018-10-19 NOTE — Progress Notes (Signed)
Physical Therapy Session Note  Patient Details  Name: Kyle Mcconnell MRN: [id number] Date of Birth: 05-28-1956  Today's Date: 10/19/2018 PT Individual Time: 1500-1600 PT Individual Time Calculation (min): 60 min   Short Term Goals: Week 1:  PT Short Term Goal 1 (Week 1): Patient will tolerate static and dynamic sitting for >10 with CGA for safety during functional activities while maintaining all precautions to improve sitting tolerance with daily activities.  PT Short Term Goal 2 (Week 1): Patient will perform log rolling L and R with mod A while maintaining all precautions to improve independence with bed mobility.  PT Short Term Goal 3 (Week 1): Patient will perform supine to/from sit with mod A of 1 person while maintaining all precautions to imporve functional mobility. PT Short Term Goal 4 (Week 1): Patient will perform transfers with least restrictive assistive device with max A of 1 person while maintaining all precautions to improve functional mobility.  PT Short Term Goal 5 (Week 1): Patient will demonstrate scooting back into the w/c and pressure relief techniques with supervision for improved positioning and to maintain skin integrity.     Skilled Therapeutic Interventions/Progress Updates:   Pt received supine in bed and agreeable to PT. Rolling R and L to don back bace with min assist the R and Mod assist to the L. Supine>sit transfer with  moderate assist and  Cues. SB transfer to the L with mod assist to control BLE.   WC mobility x  270f with supervision assist from PT. BUE 4 min forward/4 min reverse. Min cues for proper speed and posture in seat, prolonged rest break between bouts. WC mobility through hall with min-supervision assist to weave through 4 cones x 2. Pt returned to room and performed SB transfer to bed with max assist. Sit>supine completed with max assist. Brace removed from pt with roll R. Pt left supine in bed with call bell in reach and all needs met.           Therapy Documentation Precautions:  Precautions Precautions: Fall, Back Required Braces or Orthoses: Spinal Brace, Other Brace Spinal Brace: Thoracolumbosacral orthotic, Applied in sitting position Other Brace: R CAM boot Restrictions Weight Bearing Restrictions: Yes RLE Weight Bearing: Non weight bearing LLE Weight Bearing: Non weight bearing   Pain: Pain Assessment Pain Score: 0-No pain    Therapy/Group: Individual Therapy  ALorie Phenix3/02/2019, 6:00 PM
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
## 2018-10-19 NOTE — Progress Notes (Signed)
Speech Language Pathology Daily Session Note  Patient Details  Name: Kyle Mcconnell MRN: [id number] Date of Birth: 1956/06/26  Today's Date: 10/19/2018 SLP Individual Time: 0930-1030 SLP Individual Time Calculation (min): 60 min  Short Term Goals: Week 1: SLP Short Term Goal 1 (Week 1): Pt will complete semi-complex problem solving tasks with Mod assist verbal/visual cues. SLP Short Term Goal 2 (Week 1): Pt will demonstrate intellecutal awareness by identifying one of his percieved cognitive deficits with Mod assist verbal/visual cues. SLP Short Term Goal 3 (Week 1): Pt will recall basic information with Mod assist for use of compensatory memory strategies/aids. SLP Short Term Goal 4 (Week 1): Pt will remain 100% intelligible during conversational tasks with Min assist for use of speech intelligibility strategies. SLP Short Term Goal 4 - Progress (Week 1): Discontinued (comment)(n/a)  Skilled Therapeutic Interventions:Skilled ST services focused on cognitive skills. Pt demonstrated recalled rules of cad task from two ST sessions back. SLP facilitated semi-complex problem solving skills utilizing calendar task , pt required min A verbal cues for error awareness ( excluding important information) initially questioning due to deficits in recall and problem solving, however stated it was not important because I would never do these things. SLP questions impaired mental flexibility skills, however appears to be personality based given further conversation. SLP attempted another task addressing semi-complex problem skills, scheduling task, pt initially required max A verbal cues, pt stopped task and began arguing that he doesn't care to do it, because it does not pertain to his exact life/schedule. SLP provided provide of purpose of tasks to target problem solving, error awareness and recall skills in novel situations however pt refused, suggesting possible behavior to avoid poor performance. Pt's  daughter called, pt demonstrated semi-complex problem solving and recall skills, delegating bill payment schedule. Pt states that daughters will take care of medication and money management. SLP will attempt to make the tasks in future sessions, better reflect pt's life to assess impact of deficit verse personality.  Pt was left in room with call bell within reach and chair alarm set. SLP reccomends to continue skilled services.       Pain Pain Assessment Pain Scale: 0-10 Pain Score: 0-No pain  Therapy/Group: Individual Therapy  Orville Mena  [facility] 10/19/2018, 7:08 AM
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
## 2018-10-20 NOTE — Progress Notes (Signed)
HOB increased to 45 degrees 

## 2018-10-21 ENCOUNTER — Inpatient Hospital Stay (HOSPITAL_COMMUNITY): Payer: Federal, State, Local not specified - PPO | Admitting: Occupational Therapy

## 2018-10-21 ENCOUNTER — Inpatient Hospital Stay (HOSPITAL_COMMUNITY): Payer: Federal, State, Local not specified - PPO

## 2018-10-21 LAB — COMPREHENSIVE METABOLIC PANEL
ALT: 91 U/L — ABNORMAL HIGH (ref 0–44)
AST: 57 U/L — ABNORMAL HIGH (ref 15–41)
Albumin: 3.2 g/dL — ABNORMAL LOW (ref 3.5–5.0)
Alkaline Phosphatase: 109 U/L (ref 38–126)
Anion gap: 13 (ref 5–15)
BUN: 18 mg/dL (ref 8–23)
CO2: 21 mmol/L — ABNORMAL LOW (ref 22–32)
Calcium: 9.3 mg/dL (ref 8.9–10.3)
Chloride: 99 mmol/L (ref 98–111)
Creatinine, Ser: 0.88 mg/dL (ref 0.61–1.24)
Glucose, Bld: 115 mg/dL — ABNORMAL HIGH (ref 70–99)
Potassium: 4.3 mmol/L (ref 3.5–5.1)
Sodium: 133 mmol/L — ABNORMAL LOW (ref 135–145)
Total Bilirubin: 1.4 mg/dL — ABNORMAL HIGH (ref 0.3–1.2)
Total Protein: 7.1 g/dL (ref 6.5–8.1)

## 2018-10-21 LAB — CREATININE, SERUM
Creatinine, Ser: 0.96 mg/dL (ref 0.61–1.24)
GFR calc non Af Amer: >60 mL/min (ref >60)
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
## 2018-10-21 NOTE — Progress Notes (Signed)
Physical Therapy Session Note  Patient Details  Name: Kyle Mcconnell MRN: [id number] Date of Birth: 01-09-1956  Today's Date: 10/21/2018 PT Individual Time: 1100-1205 PT Individual Time Calculation (min): 65 min   Short Term Goals: Week 1:  PT Short Term Goal 1 (Week 1): Patient will tolerate static and dynamic sitting for >10 with CGA for safety during functional activities while maintaining all precautions to improve sitting tolerance with daily activities.  PT Short Term Goal 2 (Week 1): Patient will perform log rolling L and R with mod A while maintaining all precautions to improve independence with bed mobility.  PT Short Term Goal 3 (Week 1): Patient will perform supine to/from sit with mod A of 1 person while maintaining all precautions to imporve functional mobility. PT Short Term Goal 4 (Week 1): Patient will perform transfers with least restrictive assistive device with max A of 1 person while maintaining all precautions to improve functional mobility.  PT Short Term Goal 5 (Week 1): Patient will demonstrate scooting back into the w/c and pressure relief techniques with supervision for improved positioning and to maintain skin integrity.   Skilled Therapeutic Interventions/Progress Updates:     Patient in bed upon PT arrival. Patient alert and agreeable to PT session. W/c changed out by Amy, OT for low back, long elevating leg rests adjusted, and patient reported increased comfort, improved propulsion, and demonstrated improved posture in new w/c. TLSO and CAM boot donned in supine at beginning of session. Strap on TLSO was adjusted and L lower strap loosened for patient comfort in sitting.   Therapeutic Activity: Bed Mobility: Patient performed supine to/from sit with mod A for LE managment. Provided verbal cues for log rolling to maintain spinal precautions. Transfers: Patient performed a slide board transfer with Mod A for set-up and LE management to maintain NWB precautions.  Provided verbal cues for board placement, tends to place board too posterior, maintaining spinal precautions, and for scooting back into the w/c after the transfer.  Wheelchair Mobility:  Patient propelled wheelchair 160 feet with S and min A x1 for going through a narrow doorway. Provided verbal cues for staying to the R of the hallway and for making larger strokes with UE for improved efficiency with propulsion.  Therapeutic Exercise: Patient performed the following exercises with verbal and tactile cues for proper technique.  -AAROM for B LE for hip and knee flexion x10 1 set in supine, PT providing 25% of motion.  -W/c push ups x5 for 5 sets in sitting, clearing buttocks each trial. Educated on performing exercise every 15-20 minutes for pressure relief when sitting in chair. -Performed 2 LAQ on L with notable decreased DF and eversion at L ankle. PROM limited at neutral and patient unable to perform AROM through available range.  Patient in w/c at end of session with breaks locked, seat belt alarm set, and all needs within reach. Jock strap delivered at end of session.  Discussed home set up with patient and his caregiver. At his home he has 2 concrete steps to enter with B rails and a 36" wide doorframe in front and 4 steep steps to enter with B rails in back. Caregiver stated that [facility] in [facility]. Patient will stay in the den, however, will not be able to access a bathroom due to narrow doorways within the home.   Therapy Documentation Precautions:  Precautions Precautions: Fall, Back Required Braces or Orthoses: Spinal Brace, Other Brace Spinal Brace: Thoracolumbosacral  orthotic, Applied in sitting position Other Brace: R CAM boot Restrictions Weight Bearing Restrictions: Yes RLE Weight Bearing: Non weight bearing LLE Weight Bearing: Non weight bearing  Pain: Pain Assessment Pain Scale: 0-10 Pain Score: 9  Pain Type: Acute pain Pain  Location: Arm Pain Orientation: Right Pain Radiating Towards: shoulder Pain Descriptors / Indicators: Aching;Discomfort Pain Frequency: Intermittent Pain Onset: Progressive Patients Stated Pain Goal: 4 Pain Intervention(s): Medication (See eMAR) Multiple Pain Sites: Yes 2nd Pain Site Pain Score: 8 Pain Type: Acute pain Pain Location: Hip Pain Orientation: Left Pain Descriptors / Indicators: Aching Pain Frequency: Constant Pain Onset: On-going Patient's Stated Pain Goal: 4 Pain Intervention(s): Medication (See eMAR)   Therapy/Group: Individual Therapy  Helayne Seminole, PT DPT 10/21/2018, 12:45 PM

## 2018-10-21 NOTE — Progress Notes (Signed)
Occupational Therapy Session Note  Patient Details  Name: Kyle Mcconnell MRN: [id number] Date of Birth: Jan 05, 1956  Today's Date: 10/21/2018 OT Individual Time: [id number] OT Individual Time Calculation (min): 60 min    Short Term Goals: Week 1:  OT Short Term Goal 1 (Week 1): Pt will complete basic sliding board transfer with +1 assist in order to decrease caregiver burden OT Short Term Goal 2 (Week 1): Pt will complete toileting task on BSC/padded tub bench with +1 assist in order to decrease caregiver burden OT Short Term Goal 3 (Week 1): Pt will independently recall all WBing and orthosis requirements in prep for functional tasks OT Short Term Goal 4 (Week 1): Pt will complete bed mobility during self care task with +1 assist in order to decrease caregiver burden OT Short Term Goal 5 (Week 1): Pt will complete LB dressing with max A using AE PRN  Skilled Therapeutic Interventions/Progress Updates:    Pt seen for OT session focusing on functional mobility, directing care and strengthening. Pt in supine upon arrival with RN present administering pain medications and changing ankle dressing. Pt voicing soreness in L LE and R ankle, unrated but reports RN administered pain medications and able to cont with therapy as able. He denied need for bathing/dressing routine this morning. Donned R CAM boot and L shoe total A.  He transferred to sitting EOB with min A using hospital bed functions, able to manage B LEs off EOB this session. He  Completed sliding board transfer to w/c with assist for management of B LEs in order to maintaing NWBing pre-cautions. He self propelled w/c throughout unit with supervision, rest breaks required throughout.  Pt able to direct therapist in set-up of w/c in prep for transfer with 1 VC required. Completed sliding board transfer with assist to stabilize equipment and supervision for safety. Completed lateral leans to R/L, x5 each direction in simulation of toieting  task. Initially min A for dynamic balance and technique progressing to supervision. Completed x10 pushups using pushup blocks for UE strengthening in prep for functional transfers. Pt returned to w/c in same manner as described above. Returned to room propelling w/c. Completed grooming task from w/c level with set-up. Pt left sitting up in w/c at end of session with chair belt alarm on and all needs in reach.   Therapy Documentation Precautions:  Precautions Precautions: Fall, Back Required Braces or Orthoses: Spinal Brace, Other Brace Spinal Brace: Thoracolumbosacral orthotic, Applied in sitting position Other Brace: R CAM boot Restrictions Weight Bearing Restrictions: Yes RLE Weight Bearing: Non weight bearing LLE Weight Bearing: Non weight bearing   Therapy/Group: Individual Therapy  Bernyce Brimley L 10/21/2018, 7:01 AM

## 2018-10-21 NOTE — Progress Notes (Addendum)
Physical Therapy Session Note  Patient Details  Name: TALTON DELPRIORE MRN: [id number] Date of Birth: Sep 01, 1955  Today's Date: 10/21/2018 PT Individual Time: 1308-1350 PT Individual Time Calculation (min): 42 min   Short Term Goals: Week 1:  PT Short Term Goal 1 (Week 1): Patient will tolerate static and dynamic sitting for >10 with CGA for safety during functional activities while maintaining all precautions to improve sitting tolerance with daily activities.  PT Short Term Goal 1 - Progress (Week 1): Met PT Short Term Goal 2 (Week 1): Patient will perform log rolling L and R with mod A while maintaining all precautions to improve independence with bed mobility.  PT Short Term Goal 2 - Progress (Week 1): Met PT Short Term Goal 3 (Week 1): Patient will perform supine to/from sit with mod A of 1 person while maintaining all precautions to imporve functional mobility. PT Short Term Goal 3 - Progress (Week 1): Met PT Short Term Goal 4 (Week 1): Patient will perform transfers with least restrictive assistive device with max A of 1 person while maintaining all precautions to improve functional mobility.  PT Short Term Goal 4 - Progress (Week 1): Met PT Short Term Goal 5 (Week 1): Patient will demonstrate scooting back into the w/c and pressure relief techniques with supervision for improved positioning and to maintain skin integrity.  PT Short Term Goal 5 - Progress (Week 1): Met  Skilled Therapeutic Interventions/Progress Updates:  Pt received supine in bed and agreeable to therapy session. Pt able to recall 3/3 spine precautions and B LE NWB precautions without cuing. Therapy session focused on reinforcement of prior education proper set-up and sequencing of transfers. Pt performed logroll in bed with up to mod A to maintain hip alignment with shoulders and education on maintaining precautions during rolling - total A donning TLSO. Pt required mod A for supine<>sit with mod cuing for proper  sequencing of logroll and reverse logroll technique to maintain spine precautions. Pt performed slideboard transfer bed<>wheelchair<>mat table with min cuing for proper sequencing and mod A for B LE management. Pt continues to require max A for proper placement of sliding board prior to beginning transfer. Pt performed supine B LE terminal knee extension with straight leg raise x 15 each with cuing to maintain knee extension and cuing for breathing technique. Pt returned to supine in bed via slide board transfer, as described above, with needs in reach and family present.  Therapy Documentation Precautions:  Precautions Precautions: Fall, Back Precaution Booklet Issued: No Precaution Comments: log roll, back precautions Required Braces or Orthoses: Spinal Brace, Other Brace Spinal Brace: Thoracolumbosacral orthotic, Applied in supine position Other Brace: R CAM boot Restrictions Weight Bearing Restrictions: Yes RLE Weight Bearing: Non weight bearing LLE Weight Bearing: Non weight bearing  Pain: No complaints of pain.   Therapy/Group: Individual Therapy  Tawana Scale, PT, DPT 10/21/2018, 3:24 PM

## 2018-10-21 NOTE — Progress Notes (Signed)
Speech Language Pathology Daily Session Note  Patient Details  Name: Kyle Mcconnell MRN: [id number] Date of Birth: Feb 03, 1956  Today's Date: 10/21/2018 SLP Individual Time: 1400-1430 SLP Individual Time Calculation (min): 30 min  Short Term Goals: Week 1: SLP Short Term Goal 1 (Week 1): Pt will complete semi-complex problem solving tasks with Mod assist verbal/visual cues. SLP Short Term Goal 2 (Week 1): Pt will demonstrate intellecutal awareness by identifying one of his percieved cognitive deficits with Mod assist verbal/visual cues. SLP Short Term Goal 3 (Week 1): Pt will recall basic information with Mod assist for use of compensatory memory strategies/aids. SLP Short Term Goal 4 (Week 1): Pt will remain 100% intelligible during conversational tasks with Min assist for use of speech intelligibility strategies. SLP Short Term Goal 4 - Progress (Week 1): Discontinued (comment)(n/a)  Skilled Therapeutic Interventions:Skilled ST services focused on cognitive skills. Pt demonstrated recall of most recent ST session (two days ago) Mod I. SLP focused on complex problem solving task to highlight awareness and problem solving deficits. SLP facilitated complex problem solving skills and recall strategies utizling insurance comparison task, pt required initial instruction to organize information and min A verbal cues for problem solving for the end solution, picking a plan. Pt argued throughout the task, demonstrating fixed thinking and relating task only to personal beliefs, despite thorough explanation of problem solving in novel task. Pt's fiance and pt reported cognitive function is back at baseline, and "he is only going to do what he find interesting." SLP continues to believe behavior/perosnality is impacting performance of cognitive ability and will leave adjustments in treatment plan up to primary therapist. Pt was left in room with call bell within reach and bed alarm set. Recommend to continue  skilled ST services.      Pain Pain Assessment Pain Scale: 0-10 Pain Score: 8  Pain Type: Acute pain;Surgical pain Pain Location: Ankle Pain Orientation: Right Pain Descriptors / Indicators: Aching;Discomfort Pain Frequency: Constant Pain Onset: On-going Patients Stated Pain Goal: 4 Pain Intervention(s): Medication (See eMAR) Multiple Pain Sites: Yes 2nd Pain Site Pain Score: 8 Pain Type: Acute pain Pain Location: Hip Pain Orientation: Left Pain Descriptors / Indicators: Aching Pain Frequency: Constant Pain Onset: On-going Patient's Stated Pain Goal: 4 Pain Intervention(s): Medication (See eMAR)  Therapy/Group: Individual Therapy  Kyle Mcconnell  [facility] 10/21/2018, 10:51 AM

## 2018-10-21 NOTE — Progress Notes (Signed)
Physical Therapy Weekly Progress Note  Patient Details  Name: Kyle Mcconnell MRN: [id number] Date of Birth: 1955/08/24  Beginning of progress report period: October 14, 2018 End of progress report period: October 21, 2018  Patient has met 5 of 5 short term goals.  Patient is making progress, overall mod A for bed mobility/transfers and Min A for w/c mobility. Family education has been started with caregiver for donning TLSO and CAM boot.   Patient continues to demonstrate the following deficits muscle weakness, decreased cardiorespiratoy endurance, unbalanced muscle activation, decreased awareness, decreased safety awareness and decreased memory and decreased sitting balance and difficulty maintaining precautions and therefore will continue to benefit from skilled PT intervention to increase functional independence with mobility.  Patient progressing toward long term goals..  Continue plan of care.  PT Short Term Goals Week 1:  PT Short Term Goal 1 (Week 1): Patient will tolerate static and dynamic sitting for >10 with CGA for safety during functional activities while maintaining all precautions to improve sitting tolerance with daily activities.  PT Short Term Goal 1 - Progress (Week 1): Met PT Short Term Goal 2 (Week 1): Patient will perform log rolling L and R with mod A while maintaining all precautions to improve independence with bed mobility.  PT Short Term Goal 2 - Progress (Week 1): Met PT Short Term Goal 3 (Week 1): Patient will perform supine to/from sit with mod A of 1 person while maintaining all precautions to imporve functional mobility. PT Short Term Goal 3 - Progress (Week 1): Met PT Short Term Goal 4 (Week 1): Patient will perform transfers with least restrictive assistive device with max A of 1 person while maintaining all precautions to improve functional mobility.  PT Short Term Goal 4 - Progress (Week 1): Met PT Short Term Goal 5 (Week 1): Patient will demonstrate scooting  back into the w/c and pressure relief techniques with supervision for improved positioning and to maintain skin integrity.  PT Short Term Goal 5 - Progress (Week 1): Met Week 2:  PT Short Term Goal 1 (Week 2): Patient will perform set up for slide board transfers with <25% assistance. PT Short Term Goal 2 (Week 2): Patient will perform car transfers with mod A using least restrictive assistive device while maintaining NWB and spinal precautions. PT Short Term Goal 3 (Week 2): Patient will perform bed mobility using least restrictive assistive device with CGA for safety while maintaining spinal precautions.  PT Short Term Goal 4 (Week 2): Patient will propel w/c up/down a ramp with supervision for safety in order to enter and exit his home.   Skilled Therapeutic Interventions/Progress Updates:  Financial risk analyst;Neuromuscular re-education;Psychosocial support;UE/LE Strength taining/ROM;Wheelchair propulsion/positioning;Balance/vestibular training;Discharge planning;Functional electrical stimulation;Pain management;Therapeutic Activities;UE/LE Coordination activities;Disease management/prevention;Functional mobility training;Patient/family education;Cognitive remediation/compensation;Splinting/orthotics;Therapeutic Exercise;Skin care/wound management   Therapy Documentation Precautions:  Precautions Precautions: Fall, Back Required Braces or Orthoses: Spinal Brace, Other Brace Spinal Brace: Thoracolumbosacral orthotic, Applied in sitting position Other Brace: R CAM boot Restrictions Weight Bearing Restrictions: Yes RLE Weight Bearing: Non weight bearing LLE Weight Bearing: Non weight bearing   Dorthia Tout L Kameran Lallier 10/21/2018, 1:54 PM

## 2018-10-22 ENCOUNTER — Inpatient Hospital Stay (HOSPITAL_COMMUNITY): Payer: Federal, State, Local not specified - PPO

## 2018-10-22 ENCOUNTER — Inpatient Hospital Stay (HOSPITAL_COMMUNITY): Payer: Federal, State, Local not specified - PPO | Admitting: Occupational Therapy

## 2018-10-22 DIAGNOSIS — S7012XA Contusion of left thigh, initial encounter: Secondary | ICD-10-CM

## 2018-10-22 DIAGNOSIS — S7012XD Contusion of left thigh, subsequent encounter: Secondary | ICD-10-CM
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
## 2018-10-22 NOTE — Progress Notes (Signed)
Physical Therapy Session Note  Patient Details  Name: Kyle Mcconnell MRN: [id number] Date of Birth: 1956-03-17  Today's Date: 10/22/2018 PT Individual Time: 1100-1203 PT Individual Time Calculation (min): 63 min   Short Term Goals: Week 2:  PT Short Term Goal 1 (Week 2): Patient will perform set up for slide board transfers with <25% assistance. PT Short Term Goal 2 (Week 2): Patient will perform car transfers with mod A using least restrictive assistive device while maintaining NWB and spinal precautions. PT Short Term Goal 3 (Week 2): Patient will perform bed mobility using least restrictive assistive device with CGA for safety while maintaining spinal precautions.  PT Short Term Goal 4 (Week 2): Patient will propel w/c up/down a ramp with supervision for safety in order to enter and exit his home.   Skilled Therapeutic Interventions/Progress Updates:     Patient in bed upon PT arrival. Patient alert and agreeable to PT session. CAM boot, L tennis shoe, and TLSO donned at beginning of session  Therapeutic Activity: Bed Mobility: Patient performed supine to/from sit and rolling to the L with min A for physical support without bed rail. Provided verbal cues for hand placement and scooting forward to EOB. Transfers: Patient performed slide board transfers from the bed to the w/c x1 with mod A for LE support, bed at lowest height, and in and out of the car x1 with mod assist to get in and max assist to get out at 19.5 inches seat height, set for a [employer] that his caregiver drives with a bench seat in the back. Provided verbal cues for board placement, hand placement, and technique.   Wheelchair Mobility:  Patient propelled wheelchair 200 feet with 2 90 degree turns with supervision. Provided verbal cues for technique and positioning for tight turns.  Patient in w/c at end of session with breaks locked, seat belt alarm set, and all needs within reach. Educated on performing arm push  ups every 20 min for pressure relief and agreed to staying in the w/c for at least an hour through lunch for increased sitting tolerance.    Therapy Documentation Precautions:  Precautions Precautions: Fall, Back Precaution Booklet Issued: No Precaution Comments: log roll, back precautions Required Braces or Orthoses: Spinal Brace, Other Brace Spinal Brace: Thoracolumbosacral orthotic, Applied in supine position Other Brace: R CAM boot Restrictions Weight Bearing Restrictions: Yes RLE Weight Bearing: Non weight bearing LLE Weight Bearing: Non weight bearing   Pain: 7/10 soreness in L hip/pelvis, repositioned for comfort   Therapy/Group: Individual Therapy  Helayne [place]in[place], PT, DPT 10/22/2018, 12:50 PM

## 2018-10-22 NOTE — Progress Notes (Signed)
Speech Language Pathology Discharge Summary  Patient Details  Name: Kyle Mcconnell MRN: [id number] Date of Birth: Apr 12, 1956  Today's Date: 10/22/2018 SLP Individual Time: 1430-1530 SLP Individual Time Calculation (min): 60 min   Skilled Therapeutic Interventions:  Skilled ST services focused on cognitive skills and eductaion. SLP facilitated re-evaluation of cognitive linguistic skills utiilzling Cognistat subtest, pt scored WFL for delayed recall (2 out 4 and 4 out 4 with category cue), and judgement, with mild impairment in complex problem solving/processing speed and moderate impairment in abstract thinking. Pt' and pt's fiance support positive change from initial evaluation and that cognitive skills appear close enough to baseline, that they request to be discharged from skilled ST services. SLP provided education to pt and pt's fiance, pertaining to memory strategies (providing handout), and need for assistance in complex problem solving task as well as checking for errors, they both stated agreement. Pt was left in room with call bell within reach, fiance in room and bed alarm set. SLP reccomends to continue skilled services.    Patient has met 4 of 4 long term goals.  Patient to discharge at overall   level.  Reasons goals not met:     Clinical Impression/Discharge Summary:   Pt made great progress meeting 4 out 4 , recurring supervision A. Pt demonstrated improvement in complex problem solving, emergent awareness and short term recall, evidence of re-evaluation of Cognistat, pt improved from max-mod impairment to mild-WFL in all above areas. Pt demonstrated ability to express abstract thought with 100% intelligibility in conversation.  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 Pt and pt's wife support near cognitive baseline and request discharge from [place] services to focus on physical recovery. SLP communicated with PT and agree with functional improvement in cognitive changes. Pt benefited from skilled ST services in order to maximize functional independence and reduced burden of care, pt will require 24/hour and at times intermittent supervision upon discharge from a cognitive standpoint.   Care Partner:     Type of Caregiver Assistance: Physical;Cognitive  Recommendation:  24 hour supervision/assistance      Equipment: N/A   Reasons for discharge: Treatment goals met;Other (comment)(Pt and pt's fiance's request due to near baseline cogntion)   Patient/Family Agrees with Progress Made and Goals Achieved: Yes    Corinda Ammon  [facility] 10/22/2018, 3:55 PM

## 2018-10-22 NOTE — Progress Notes (Signed)
Occupational Therapy Weekly Progress Note  Patient Details  Name: Kyle Mcconnell MRN: [id number] Date of Birth: 1956/05/15  Beginning of progress report period: October 15, 2018 End of progress report period: October 22, 2018  Today's Date: 10/22/2018 OT Individual Time: 3382-5053 OT Individual Time Calculation (min): 75 min    Patient has met 5 of 5 short term goals.  Pt is making steady progress towards OT goals. He is completing sliding board transfers at overall min A. He is completing bathing/dressing routine from bed level with min A following set-up and use of AE for independence. He requires total A for toileting sitting upright on padded tub bench with cutout. Pt's "friend"/caregiver has been present intermittently throughout rehab admission and has observed pt's CLOF and modified ADL routine. Discussions throughout rehab admission regarding d/c planning, home accessibility, and needed DME. Caregiver has been provided with home measurement sheet and in the process of contracting ramp being built. Will begin hands on training in prep for d/c home next week.   Patient continues to demonstrate the following deficits: muscle weakness, decreased cardiorespiratoy endurance, decreased problem solving, decreased safety awareness and decreased memory and decreased sitting balance, decreased postural control, decreased balance strategies and difficulty maintaining precautions and therefore will continue to benefit from skilled OT intervention to enhance overall performance with BADL and Reduce care partner burden.  Patient progressing toward long term goals..  Continue plan of care.  OT Short Term Goals Week 1:  OT Short Term Goal 1 (Week 1): Pt will complete basic sliding board transfer with +1 assist in order to decrease caregiver burden OT Short Term Goal 1 - Progress (Week 1): Met OT Short Term Goal 2 (Week 1): Pt will complete toileting task on BSC/padded tub bench with +1 assist in order to  decrease caregiver burden OT Short Term Goal 2 - Progress (Week 1): Met OT Short Term Goal 3 (Week 1): Pt will independently recall all WBing and orthosis requirements in prep for functional tasks OT Short Term Goal 3 - Progress (Week 1): Met OT Short Term Goal 4 (Week 1): Pt will complete bed mobility during self care task with +1 assist in order to decrease caregiver burden OT Short Term Goal 4 - Progress (Week 1): Met OT Short Term Goal 5 (Week 1): Pt will complete LB dressing with max A using AE PRN OT Short Term Goal 5 - Progress (Week 1): Met Week 2:  OT Short Term Goal 1 (Week 2): STG=LTG due to LOS  Skilled Therapeutic Interventions/Progress Updates:    Pt seen for OT session focusing on ADL re-training and functional transfers. Pt in supine upon arrival, agreeable to tx session and no complaints of pain. He reports having completed bathing/dressing routine last night/this morning and only requesting to get to sink in w/c for grooming tasks.  Pt unable to reach L foot from supine position in order to don sock/shoe and completed with total A. TLSO and CAM boot donned total A.  He transferred to EOB using hospital bed functions with guarding assist. With education/demonstration, pt able to place sliding board and completed sliding board transfer to w/c with CGA and assist for management of LEs.  Grooming tasks completed from w/c level at sink with set-up.  Addressed toilet transfer and toileting task. Completed sliding board transfer to padded tub bench. Min cuing for proper set-up of w/c in prep for transfer. Close supervision with assist to steady euipmenet for transfer to padded bench. LAteral leans with supervision to  pull pants down. Required increased assist for managing pants up as they would get hung on equipment. Lateral leans to both sides with steadying assist. Pt able to completed modified push up to clear buttock for therapist to pull pants up remainder of way with total A.  Discussed use of bsic drop arm BSC in order  to allow for increased UE support for mobility, pt willing to try next session. He completed sliding board transfer back to w/c in same manner as described above.  Pt provided with sock aid, LH sponge and elastic shoelaces. Education and demonstration provided regarding use. Pt able to return demonstrate with min A overall from w/c level for donning L sock and shoe. Pt left sitting up in w/c at end of session, chair belt alarm on and all needs in reach.   Therapy Documentation Precautions:  Precautions Precautions: Fall, Back Precaution Booklet Issued: No Precaution Comments: log roll, back precautions Required Braces or Orthoses: Spinal Brace, Other Brace Spinal Brace: Thoracolumbosacral orthotic, Applied in supine position Other Brace: R CAM boot Restrictions Weight Bearing Restrictions: Yes RLE Weight Bearing: Non weight bearing LLE Weight Bearing: Non weight bearing   Therapy/Group: Individual Therapy  Aditi Rovira L 10/22/2018, 6:58 AM

## 2018-10-23 ENCOUNTER — Inpatient Hospital Stay (HOSPITAL_COMMUNITY): Payer: Federal, State, Local not specified - PPO

## 2018-10-23 ENCOUNTER — Inpatient Hospital Stay (HOSPITAL_COMMUNITY): Payer: Federal, State, Local not specified - PPO | Admitting: Occupational Therapy

## 2018-10-23 ENCOUNTER — Encounter (HOSPITAL_COMMUNITY): Payer: Federal, State, Local not specified - PPO | Admitting: Psychology

## 2018-10-23 DIAGNOSIS — R000 Tachycardia, unspecified: Secondary | ICD-10-CM

## 2018-10-23 LAB — CBC WITH DIFFERENTIAL/PLATELET
Abs Immature Granulocytes: 0.14 10*3/uL — ABNORMAL HIGH (ref 0.00–0.07)
Basophils Absolute: 0 10*3/uL (ref 0.0–0.1)
Basophils Relative: 0 %
Eosinophils Absolute: 0.1 10*3/uL (ref 0.0–0.5)
Eosinophils Relative: 2 %
HCT: 33.8 % — ABNORMAL LOW (ref 39.0–52.0)
Hemoglobin: 10.6 g/dL — ABNORMAL LOW (ref 13.0–17.0)
Immature Granulocytes: 2 %
Lymphocytes Relative: 23 %
Lymphs Abs: 1.7 10*3/uL (ref 0.7–4.0)
MCH: 28.2 pg (ref 26.0–34.0)
MCHC: 31.4 g/dL (ref 30.0–36.0)
MCV: 89.9 fL (ref 80.0–100.0)
MONO ABS: 1.1 10*3/uL — AB (ref 0.1–1.0)
Monocytes Relative: 14 %
Neutro Abs: 4.4 10*3/uL (ref 1.7–7.7)
Neutrophils Relative %: 59 %
PLATELETS: 400 10*3/uL (ref 150–400)
RBC: 3.76 MIL/uL — ABNORMAL LOW (ref 4.22–5.81)
RDW: 15.4 % (ref 11.5–15.5)
WBC: 7.4 10*3/uL (ref 4.0–10.5)
nRBC: 0 % (ref 0.0–0.2)

## 2018-10-23 NOTE — Progress Notes (Signed)
Physical Therapy Session Note  Patient Details  Name: Kyle Mcconnell MRN: [id number] Date of Birth: 06-11-1956  Today's Date: 10/23/2018 PT Individual Time: 1535-1605 PT Individual Time Calculation (min): 30 min   Short Term Goals:  Week 2:  PT Short Term Goal 1 (Week 2): Patient will perform set up for slide board transfers with <25% assistance. PT Short Term Goal 2 (Week 2): Patient will perform car transfers with mod A using least restrictive assistive device while maintaining NWB and spinal precautions. PT Short Term Goal 3 (Week 2): Patient will perform bed mobility using least restrictive assistive device with CGA for safety while maintaining spinal precautions.  PT Short Term Goal 4 (Week 2): Patient will propel w/c up/down a ramp with supervision for safety in order to enter and exit his home.   Skilled Therapeutic Interventions/Progress Updates:   Pt resting in bed.  TLSO has been returned.  Orthopedic MD assessed pt and pt will no longer wear CAM boot on R foot; he will wear PRAFO on R foot at night in bed.  Wound was debrided and wound vac placed in R ankle wound.  Pt's dtr Tanae here.  She participated in donning TLSO with pt in bed, log rolling, bed> w/c transfer with slide board.  She needed cues for moving slower, gently placing TLSO, how to assist with LEs for sitting up and transfers.   PT reiterated that pt needed to ask staff for help getting pt back in bed.   Pt left resting in bed family present.      Therapy Documentation Precautions:  Precautions Precautions: Fall, Back Precaution Booklet Issued: No Precaution Comments: log roll, back precautions Required Braces or Orthoses: Spinal Brace, Other Brace Spinal Brace: Thoracolumbosacral orthotic, Applied in supine position Other Brace: R CAM boot Restrictions Weight Bearing Restrictions: Yes RLE Weight Bearing: Non weight bearing LLE Weight Bearing: Non weight bearing  Pain: 8/10 R ankle; medicated      Therapy/Group: Individual Therapy  Gunther Zawadzki 10/23/2018, 4:14 PM

## 2018-10-23 NOTE — Progress Notes (Signed)
Physical Therapy Session Note  Patient Details  Name: AGGIE HOUSEHOLDER MRN: [id number] Date of Birth: [birth year]  Today's Date: 10/23/2018 PT Individual Time: 0910-1005 and [id number] PT Individual Time Calculation (min): 55 min and 30 min  Short Term Goals: Week 2:  PT Short Term Goal 1 (Week 2): Patient will perform set up for slide board transfers with <25% assistance. PT Short Term Goal 2 (Week 2): Patient will perform car transfers with mod A using least restrictive assistive device while maintaining NWB and spinal precautions. PT Short Term Goal 3 (Week 2): Patient will perform bed mobility using least restrictive assistive device with CGA for safety while maintaining spinal precautions.  PT Short Term Goal 4 (Week 2): Patient will propel w/c up/down a ramp with supervision for safety in order to enter and exit his home.   Skilled Therapeutic Interventions/Progress Updates:     [phone] Patient on [facility] with SO present upon PT arrival. Patient alert and agreeable to PT session. SO present for entire session and performed family education with mobility and doffing TLSO and CAM boot.   Therapeutic Activity: Bed Mobility: Patient performed sit to supine with min A for LE management. Provided verbal cues for caregiver body mechanics for assisting pts LE's, and for patient to log roll from side-lying to supine.  Transfers: Patient performed slide board transfers from [facility] to w/c with CGA and set-up assist from SO. Provided verbal cues for maintaining NWB through B LEs.  Wheelchair Mobility:  Patient propelled wheelchair 150' feet with S and up and down a ramp x2 with CGA to min A for safety. Provided verbal cues for using hands to break the w/c and to maintain spinal precautions.  Patient in bed at end of session with breaks locked, bed alarm set, and all needs within reach.   [id number] Patient lying in bed upon PT arrival. Patient reported that the orthopedic surgeon had come by prior to  session to debride the wound on his R foot and apply a wound vac, confirmed by MD note in chart. Educated patient on no longer wearing the CAM boot, but that the [facility] would need to still be worn at night of the R foot. Patient stated that he did not think he should get out of bed due to his R foot being very sore, 8-9/10 pain, after the debridement and that his TLSO was currently being adjusted by the orthotist, however, he was agreeable to bed level LE exercises. Patient performed B SLR, B ankle DF/PF, and L eversion in supine x10 for 1 rep each. Verbal and tactile cues were provided for proper technique. Performed heel cord stretching on L with manual overpressure from PT for 30 second holds x3 and gentle PROM of R DF/PF. PT noted dark color of urine in urinal during session, educated patient on drinking plenty of water and provided water during session, RN made aware.     Therapy Documentation Precautions:  Precautions Precautions: Fall, Back Precaution Booklet Issued: No Precaution Comments: log roll, back precautions Required Braces or Orthoses: Spinal Brace, Other Brace Spinal Brace: Thoracolumbosacral orthotic, Applied in supine position Other Brace: R CAM boot Restrictions Weight Bearing Restrictions: Yes RLE Weight Bearing: Non weight bearing LLE Weight Bearing: Non weight bearing Pain: Pain Assessment Pain Scale: 0-10 Pain Score: 9  Pain Type: Acute pain Pain Location: Ankle Pain Orientation: Right Pain Descriptors / Indicators: Aching Pain Frequency: Constant Pain Onset: On-going Pain Intervention(s): Medication (See eMAR)   Therapy/Group: Individual Therapy  Delmos Velaquez L  Jonthan Leite, PT, DPT 10/23/2018, 1:11 PM
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
## 2018-10-23 NOTE — Progress Notes (Signed)
Occupational Therapy Session Note  Patient Details  Name: Kyle Mcconnell MRN: [id number] Date of Birth: 27-Oct-1955  Today's Date: 10/23/2018 OT Individual Time: 6599-3570 OT Individual Time Calculation (min): 70 min    Short Term Goals: Week 2:  OT Short Term Goal 1 (Week 2): STG=LTG due to LOS  Skilled Therapeutic Interventions/Progress Updates:    Pt seen for OT session focusing on ADL re-training and functionral transfers. Pt in supine upon arrival, reports having completed bathing and dressing already and ready for tx session. Addressed toilet transfer goal and determining best DME. Completed sliding board transfer to standard [facility], completed with CGA with assist to stabilzie equipmenet and proepr se-tup of equipment. Pt required increased assist with clothing management as clothes getting stuck on equipment. Pt also not tolerating sitting surface well from pain/comfort stand point. Transitioned back to w/c in same manner as described above. Pt then completed sliding board transfer to  Heavy duty drop arm BSC. Pt reports increased comfort on heavy duty BSC. Pt able to complete push up on armrests of BSC in order for caregiver to complete clothing management. Pt and caregiver voice liking this option best for d/c, OT to order. Pt returned to w/c, caregiver assisted with transfer providing CGA and VCs from therapist. Will benefit from cont hands on training. Pt completed grooming tasks from w/c level mod I.  Pt's caregiver supplied photos of home entrance. Plans for ramp to be installed, however, still has threshold to enter. Demonstration and education provided regarding how to bump w/c. Therapist attempted to complete, however, due to pt's size and w/c parts, unable to effectivly tip up. Pt's caregiver she would not be able to perform this assist at d/c. Cont discussion regarding ramp accessibility and home entrance, PT made aware.  Pt left sitting up in w/c at end of session with caregiver  present, all needs in reach.  Pt cont with complaints of TLSO brace fitting. Therapist contacted [employer], rep to visit pt to assess today.   Therapy Documentation Precautions:  Precautions Precautions: Fall, Back Precaution Booklet Issued: No Precaution Comments: log roll, back precautions Required Braces or Orthoses: Spinal Brace, Other Brace Spinal Brace: Thoracolumbosacral orthotic, Applied in supine position Other Brace: R CAM boot Restrictions Weight Bearing Restrictions: Yes RLE Weight Bearing: Non weight bearing LLE Weight Bearing: Non weight bearing   Therapy/Group: Individual Therapy  Anthem Frazer L 10/23/2018, 6:58 AM

## 2018-10-23 NOTE — Progress Notes (Signed)
Changed dressing to RLE, wound on top of lower extremity has sutures and draining yellow fluid, informed that notify for consult. While leaving room surgeon came in. Dr Jena Gauss to place wound vac, vfy cam boot for protection of RLE, Notify charge nurse of changes for conference.

## 2018-10-23 NOTE — Progress Notes (Deleted)
Physical Therapy Session Note  Patient Details  Name: Kyle Mcconnell MRN: [id number] Date of Birth: 09-Sep-1955  Today's Date: 10/23/2018 PT Individual Time:  -      Short Term Goals: Week 1:  PT Short Term Goal 1 (Week 1): Patient will tolerate static and dynamic sitting for >10 with CGA for safety during functional activities while maintaining all precautions to improve sitting tolerance with daily activities.  PT Short Term Goal 1 - Progress (Week 1): Met PT Short Term Goal 2 (Week 1): Patient will perform log rolling L and R with mod A while maintaining all precautions to improve independence with bed mobility.  PT Short Term Goal 2 - Progress (Week 1): Met PT Short Term Goal 3 (Week 1): Patient will perform supine to/from sit with mod A of 1 person while maintaining all precautions to imporve functional mobility. PT Short Term Goal 3 - Progress (Week 1): Met PT Short Term Goal 4 (Week 1): Patient will perform transfers with least restrictive assistive device with max A of 1 person while maintaining all precautions to improve functional mobility.  PT Short Term Goal 4 - Progress (Week 1): Met PT Short Term Goal 5 (Week 1): Patient will demonstrate scooting back into the w/c and pressure relief techniques with supervision for improved positioning and to maintain skin integrity.  PT Short Term Goal 5 - Progress (Week 1): Met Week 2:  PT Short Term Goal 1 (Week 2): Patient will perform set up for slide board transfers with <25% assistance. PT Short Term Goal 2 (Week 2): Patient will perform car transfers with mod A using least restrictive assistive device while maintaining NWB and spinal precautions. PT Short Term Goal 3 (Week 2): Patient will perform bed mobility using least restrictive assistive device with CGA for safety while maintaining spinal precautions.  PT Short Term Goal 4 (Week 2): Patient will propel w/c up/down a ramp with supervision for safety in order to enter and exit his  home.  Week 3:     Skilled Therapeutic Interventions/Progress Updates:      Therapy Documentation Precautions:  Precautions Precautions: Fall, Back Precaution Booklet Issued: No Precaution Comments: log roll, back precautions Required Braces or Orthoses: Spinal Brace, Other Brace Spinal Brace: Thoracolumbosacral orthotic, Applied in supine position Other Brace: R CAM boot Restrictions Weight Bearing Restrictions: Yes RLE Weight Bearing: Non weight bearing LLE Weight Bearing: Non weight bearing General:   Vital Signs: Therapy Vitals Temp: 98.7 F (37.1 C) Pulse Rate: (!) 108 Resp: 19 BP: 105/75 Patient Position (if appropriate): Lying Oxygen Therapy SpO2: 95 % O2 Device: Room Air Pain:   Mobility:   Locomotion :    Trunk/Postural Assessment :    Balance:   Exercises:   Other Treatments:      Therapy/Group: Individual Therapy  Kyle Mcconnell 10/23/2018, 7:55 AM

## 2018-10-23 NOTE — Progress Notes (Signed)
Ortho Trauma Progress Note  Continued with serous drainage. Skin edges with some sloughing. Performed removal of superficial skin slough and placed incisional wound vac. Continue through the weekend. Will recheck on Monday AM. Possible need for I&D if continues to drain. Recommend discontinuing Cam walking boot during day and continue PRAFO at night to alleviate pressure from ankle.  Roby Lofts, MD [facility] [phone] (phone)

## 2018-10-24 ENCOUNTER — Inpatient Hospital Stay (HOSPITAL_COMMUNITY): Payer: Federal, State, Local not specified - PPO

## 2018-10-24 ENCOUNTER — Inpatient Hospital Stay (HOSPITAL_COMMUNITY): Payer: Federal, State, Local not specified - PPO | Admitting: Occupational Therapy

## 2018-10-24 NOTE — Progress Notes (Signed)
Occupational Therapy Session Note  Patient Details  Name: Kyle Mcconnell MRN: [id number] Date of Birth: 06/14/1956  Today's Date: 10/24/2018 OT Individual Time: 0830-0930 OT Individual Time Calculation (min): 60 min    Short Term Goals: Week 2:  OT Short Term Goal 1 (Week 2): STG=LTG due to LOS  Skilled Therapeutic Interventions/Progress Updates:    Pt seen for OT session focusing on family education and functional transfers. Pt in supine upon arrival with signifcant other and 2 daughters present. Pt denying pain and agreeable to scheduled family ed session. Throughout session, pt's family provided hands on care and assist for pt  Education/demonstration provided regarding donning technique for TLSO. Pt able to recall 3/3 back pre-cautions independently.   Sliding board transfers completed throughout session, EOB>w/c>drop arm BSC>w/c. Pt able to direct caregivers with min-modcues from therapist. Transfers completed with overall min A increased assist for transfer from [facility] at tendency for pants to get hung on sliding board.   Extensive education/demonstration for sliding board technique, weight shift, and head hip relationship.  Caregivers instructed how to assist pt with clothing management on toilet, combination of lateral leans and pt completing push-up in order for caregiver to pull pants up.   Education provided throughout regarding pre-cautions, brace wear schedule, w/c parts management, DME, continuum of care, and d/c planning.   Pt left seated in w/c at end of session, all needs in reach.    Family will cont to benefit from hands on training. Scheudled for tomorrow at 8:30.   Therapy Documentation Precautions:  Precautions Precautions: Fall, Back Precaution Booklet Issued: No Precaution Comments: log roll, back precautions Required Braces or Orthoses: Spinal Brace, Other Brace Spinal Brace: Thoracolumbosacral orthotic, Applied in supine position Other Brace: R CAM  boot Restrictions Weight Bearing Restrictions: Yes RLE Weight Bearing: Non weight bearing LLE Weight Bearing: Non weight bearing Pain:   No/denies pain   Therapy/Group: Individual Therapy  Jabir Dahlem L 10/24/2018, 6:54 AM
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
## 2018-10-24 NOTE — Progress Notes (Addendum)
Physical Therapy Session Note  Patient Details  Name: Kyle Mcconnell MRN: [id number] Date of Birth: [birth year]  Today's Date: 10/24/2018 PT Individual Time: 4536-4680, 3212-2482, and 1445-1515 PT Individual Time Calculation (min): 30 min   Short Term Goals: Week 2:  PT Short Term Goal 1 (Week 2): Patient will perform set up for slide board transfers with <25% assistance. PT Short Term Goal 2 (Week 2): Patient will perform car transfers with mod A using least restrictive assistive device while maintaining NWB and spinal precautions. PT Short Term Goal 3 (Week 2): Patient will perform bed mobility using least restrictive assistive device with CGA for safety while maintaining spinal precautions.  PT Short Term Goal 4 (Week 2): Patient will propel w/c up/down a ramp with supervision for safety in order to enter and exit his home.   Skilled Therapeutic Interventions/Progress Updates:     [phone] Patient in w/c upon PT arrival. Patient alert and agreeable to PT session. Patient's 2 daughters and SO were present for entire session for family education.  Therapeutic Activity: Bed Mobility: Patient performed sit to/from supine with min A from his daughter x2. Provided verbal cues for maintaining spinal precautions and laying in side-lying before rolling to supine. Transfers: Patient performed slide board transfers to/from the car and to the bed from the w/c with min A from his daughter. Provided verbal cues for board placement, hand placement, proper body mechanics, and w/c set up for transfers.  Wheelchair Mobility:  Patient propelled wheelchair 190 feet with S for safety. Provided verbal cues for long, smooth strokes and technique for turns. Family performed management of w/c parts throughout session with verbal cues for technique.  Education provided throughout regarding pre-cautions, brace wear schedule, w/c parts management, DME, continuum of care, and d/c planning with patient and  family.   Patient in bed at end of session with breaks locked, bed alarm set, and all needs within reach and family at bedside.   [id number] Patient in bed upon PT arrival. Patient alert and agreeable to PT session. Patient's daughter donned TLSO at beginning of session with cues for proper technique  Therapeutic Activity: Bed Mobility: Patient performed supine to sit with min A. Provided verbal cues for body mechanic for daughter to safely assist patient. Transfers: Transfers: Patient performed slide board transfers from the bed from the w/c with min A from his daughter. Provided verbal cues for board placement, hand placement, proper body mechanics, and w/c set up for transfers.  Therapeutic Exercise: Patient performed the following exercises with verbal and tactile cues for proper technique. -B LAQ in sitting -B DF stretch with 30 second holds x2 with daughter providing manual overpressure.  Patient in w/c at end of session with breaks locked, seat belt alarm set, and all needs within reach.   [id number] Patient in w/c upon PT arrival. Patient alert and agreeable to PT session.  Therapeutic Activity: Bed Mobility: Patient performed sit to supinet with min A from daughter. Provided verbal cues for log rolling to maintain spinal precautions. Transfers:  Patient performed slide board transfers from the w/c from the bed with min A from his daughter. Provided verbal cues for board placement, hand placement, proper body mechanics, and w/c set up for transfers.  Therapeutic Exercise: Patient performed the following exercises with verbal and tactile cues for proper technique. -marching in sitting with 25% assist from PT for full ROM  -B DF stretch with 30 second holds x2 with manual overpressure  Wheelchair Mobility:  Patient propelled wheelchair  100 feet and 50 feet outside on unlevel concrete with S to min A, requiring min a x3 when turning on an uphill slope. Provided verbal cues for long  smooth strokes and technique for turns. Educated on energy conservation techniques and safety when in the community. Patient's daughter managed all w/c parts with minimal cues throughout session.   Patient in bed at end of session with breaks locked, handed off to NT to take vitals with patient's daughter and SO at bedside.    Therapy Documentation Precautions:  Precautions Precautions: Fall, Back Precaution Booklet Issued: No Precaution Comments: log roll, back precautions Required Braces or Orthoses: Spinal Brace, Other Brace Spinal Brace: Thoracolumbosacral orthotic, Applied in supine position Other Brace: R CAM boot Restrictions Weight Bearing Restrictions: Yes RLE Weight Bearing: Non weight bearing LLE Weight Bearing: Non weight bearing Pain: Pain Assessment Pain Scale: 0-10 Pain Score: 7  Pain Type: Acute pain Pain Location: Foot Pain Orientation: Right Pain Descriptors / Indicators: Aching Pain Frequency: Constant Pain Onset: On-going Pain Intervention(s): Medication (See eMAR)    Therapy/Group: Individual Therapy  Helayne [place]in[place], PT, DPT 10/24/2018, 4:26 PM

## 2018-10-25 ENCOUNTER — Encounter (HOSPITAL_COMMUNITY): Payer: Federal, State, Local not specified - PPO | Admitting: Occupational Therapy

## 2018-10-25 ENCOUNTER — Inpatient Hospital Stay (HOSPITAL_COMMUNITY): Payer: Medicare Other

## 2018-10-25 ENCOUNTER — Inpatient Hospital Stay (HOSPITAL_COMMUNITY): Payer: Federal, State, Local not specified - PPO

## 2018-10-25 ENCOUNTER — Other Ambulatory Visit: Payer: Self-pay

## 2018-10-25 DIAGNOSIS — T148XXA Other injury of unspecified body region, initial encounter: Secondary | ICD-10-CM

## 2018-10-25 LAB — COMPREHENSIVE METABOLIC PANEL
ALK PHOS: 120 U/L (ref 38–126)
ALT: 81 U/L — AB (ref 0–44)
AST: 49 U/L — ABNORMAL HIGH (ref 15–41)
Albumin: 3.1 g/dL — ABNORMAL LOW (ref 3.5–5.0)
Anion gap: 9 (ref 5–15)
BUN: 21 mg/dL (ref 8–23)
CO2: 25 mmol/L (ref 22–32)
Calcium: 9.4 mg/dL (ref 8.9–10.3)
Chloride: 100 mmol/L (ref 98–111)
Creatinine, Ser: 0.86 mg/dL (ref 0.61–1.24)
GFR calc Af Amer: >60 mL/min (ref >60)
GFR calc non Af Amer: >60 mL/min (ref >60)
Glucose, Bld: 146 mg/dL — ABNORMAL HIGH (ref 70–99)
Potassium: 4 mmol/L (ref 3.5–5.1)
Sodium: 134 mmol/L — ABNORMAL LOW (ref 135–145)
Total Bilirubin: 1 mg/dL (ref 0.3–1.2)
Total Protein: 6.7 g/dL (ref 6.5–8.1)

## 2018-10-25 NOTE — Progress Notes (Signed)
Physical Therapy Session Note  Patient Details  Name: Kyle Mcconnell MRN: [id number] Date of Birth: 07-Nov-1955  Today's Date: 10/25/2018 PT Individual Time: 1415-1530 PT Individual Time Calculation (min): 75 min   Short Term Goals: Week 2:  PT Short Term Goal 1 (Week 2): Patient will perform set up for slide board transfers with <25% assistance. PT Short Term Goal 2 (Week 2): Patient will perform car transfers with mod A using least restrictive assistive device while maintaining NWB and spinal precautions. PT Short Term Goal 3 (Week 2): Patient will perform bed mobility using least restrictive assistive device with CGA for safety while maintaining spinal precautions.  PT Short Term Goal 4 (Week 2): Patient will propel w/c up/down a ramp with supervision for safety in order to enter and exit his home.   Skilled Therapeutic Interventions/Progress Updates:    Patient in supine, performed LE therex including SAQ x 10 w/5 sec hold, hip adductor squeezes w/5 sec hold x 10 and glut sets x 10 5 sec hold.  Rolled in bed with S to don brace with max A.  Patient rolled and side to sit with S appropriate technique for precautions with cues.  Slide board to w/c min A.  Set up for w/c min A.  Propelled to gym for car transfer with S.  Transfer with pt directing and placing board with mod A for LE's.  Discussed options for front seat vs back with pt and family in room.  Propelled partway to 6N ramp.  Up and down ramp with close S.  Patient propelled partway to unit and performed 4'fwd &,4'reverse on arm bike @ level 1.  Propelled partway back to room and wife present to assist with transfer placing board appropriately, stated pt pushed w/c up too far and noted when back to bed unable to turn to get legs in due to proximity of w/c near bed rail.  Assisted to remove chair, but pt's wife kept him safe and assisted to doff brace in supine.  Checked her off on assisting pt OOB.  Discussed possibly using arm bike with  her supervision.  Will check with nursing for approval. Pt in supine with wife present and bed alarm on .   Therapy Documentation Precautions:  Precautions Precautions: Fall, Back Precaution Booklet Issued: No Precaution Comments: log roll, back precautions Required Braces or Orthoses: Spinal Brace, Other Brace Spinal Brace: Thoracolumbosacral orthotic, Applied in supine position Other Brace: R CAM boot Restrictions Weight Bearing Restrictions: Yes RLE Weight Bearing: Non weight bearing LLE Weight Bearing: Non weight bearing Pain: Pain Assessment Pain Score: 2  Pain Type: Acute pain Pain Location: Ankle Pain Orientation: Right Pain Onset: On-going Pain Intervention(s): Ambulation/increased activity    Therapy/Group: Individual Therapy  Elray Mcgregor  [place],  10/25/2018, 3:14 PM

## 2018-10-25 NOTE — Progress Notes (Signed)
Physical Therapy Session Note  Patient Details  Name: Kyle Mcconnell MRN: [id number] Date of Birth: [birth year]  Today's Date: 10/25/2018 PT Individual Time: 1030-1130 PT Individual Time Calculation (min): 60 min   Short Term Goals: Week 2:  PT Short Term Goal 1 (Week 2): Patient will perform set up for slide board transfers with <25% assistance. PT Short Term Goal 2 (Week 2): Patient will perform car transfers with mod A using least restrictive assistive device while maintaining NWB and spinal precautions. PT Short Term Goal 3 (Week 2): Patient will perform bed mobility using least restrictive assistive device with CGA for safety while maintaining spinal precautions.  PT Short Term Goal 4 (Week 2): Patient will propel w/c up/down a ramp with supervision for safety in order to enter and exit his home.   Skilled Therapeutic Interventions/Progress Updates:     Patient in bed upon PT arrival. Patient alert and agreeable to PT session. Patient's daughter, [name], present for family education during session.   Therapeutic Activity: Bed Mobility: Patient performed rolling to don/doff TLSO and performed supine to/from sit with min A from his daughter. Provided verbal cues for correct placement of TLSO. Patient's daughter demonstrated safe and correct placement of TLSO and assistance with bed mobility and has been cleared to perform these tasks in the room by PT, RN made aware. Transfers: Patient performed slide board transfer to/from the w/c and bed with min A from his daughter, who demonstrated safe and set-up and assistance for transfer and has been cleared to perform these transfers in the room with the patient, RN made aware. Patient performed car transfer with the slide board with min-mod A x1 with PT demonstrating and x1 with patient's daughter assisting. Provided verbal cues for board placement, body mechanics, and safe guarding of patient.  Wheelchair Mobility:  Patient propelled wheelchair  190' feet with 2 90 degree turns with supervision for safety. Provided verbal cues for technique when negotiating turns. Patient safely managed w/c breaks throughout session and his daughter managed all w/c accessories throughout session without requiring verbal cues from PT.   Patient in bed at end of session with breaks locked, bed alarm set, his daughter at bedside, and all needs within reach. Patient and daughter, [name], educated on performing mobility and transfers listed above in the room and informed to notify nursing staff when his daughter is going to leave in order to have appropriate alarms set while patient is in the room alone.    Therapy Documentation Precautions:  Precautions Precautions: Fall, Back Precaution Booklet Issued: No Precaution Comments: log roll, back precautions Required Braces or Orthoses: Spinal Brace, Other Brace Spinal Brace: Thoracolumbosacral orthotic, Applied in supine position Restrictions Weight Bearing Restrictions: Yes RLE Weight Bearing: Non weight bearing LLE Weight Bearing: Non weight bearing  Pain:  6-7/10 L hip/pelvis, constant aching pain  Therapy/Group: Individual Therapy  Helayne Seminole, PT, DPT 10/25/2018, 12:32 PM
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
## 2018-10-25 NOTE — Progress Notes (Signed)
Occupational Therapy Session Note  Patient Details  Name: Kyle Mcconnell MRN: [id number] Date of Birth: Feb 24, 1956  Today's Date: 10/25/2018 OT Individual Time: 0950-1030 OT Individual Time Calculation (min): 40 min  OT Missed Time: 20 min (x-ray)   Short Term Goals: Week 2:  OT Short Term Goal 1 (Week 2): STG=LTG due to LOS  Skilled Therapeutic Interventions/Progress Updates:    Therapist arrived at 0845 for scheduled OT session. Pt off unit at x-ray. Therapist returned at 0950.    Pt seen for OT bathing/dressing routine focusing on caregiver training. Pt in supine upon arrival with daughter present for caregiver training. Pt denying pain and agreeable to bathing/dressing routine.  Pt able to direct caregiver in proper set-up of needed items in prep for bathing/dressing at bed level. Completed UB with supervision, following assist to pull dirty shirt overhead, min cuing from therapist for technique. Pt able to direct caregiver in use of LH sponge for LB dressing with min questioning cues. Pt with much improved LE ROM and strength in order to reach LEs. VCs throughout for maintianing spinal pre-cautions during bed mobility and dressing task. Pt able to independently recall 3/3 spinal pre-cautions.  He required set-up to min A to adjust hips in bed in order to allow for efficient rolling during buttock hygiene and clothing management. LB dressing completed with min A overall.  Pt left in supine to cont dressing with hand off to PT and daughter providing assist.   Therapy Documentation Precautions:  Precautions Precautions: Fall, Back Precaution Booklet Issued: No Precaution Comments: log roll, back precautions Required Braces or Orthoses: Spinal Brace, Other Brace Spinal Brace: Thoracolumbosacral orthotic, Applied in supine position Other Brace: R CAM boot Restrictions Weight Bearing Restrictions: Yes RLE Weight Bearing: Non weight bearing LLE Weight Bearing: Non weight  bearing   Therapy/Group: Individual Therapy  Aldrich Lloyd L 10/25/2018, 5:36 AM
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
## 2018-10-25 NOTE — Plan of Care (Signed)
  Problem: Consults Goal: RH GENERAL PATIENT EDUCATION Description See Patient Education module for education specifics. Outcome: Progressing   Problem: RH SKIN INTEGRITY Goal: RH STG SKIN FREE OF INFECTION/BREAKDOWN Description Min assist   Outcome: Progressing Goal: RH STG MAINTAIN SKIN INTEGRITY WITH ASSISTANCE Description STG Maintain Skin Integrity With min Assistance.  Outcome: Progressing Flowsheets (Taken 10/25/2018 1301) STG: Maintain skin integrity with assistance: 1-Total assistance Note:  Wound vac in place right now ,changed by the wound care nurse. Goal: RH STG ABLE TO PERFORM INCISION/WOUND CARE W/ASSISTANCE Description STG Able To Perform Incision/Wound Care With min Assistance.  Outcome: Progressing Flowsheets (Taken 10/25/2018 1301) STG: Pt will be able to perform incision/wound care with assistance: 1-Total assistance   Problem: RH SAFETY Goal: RH STG ADHERE TO SAFETY PRECAUTIONS W/ASSISTANCE/DEVICE Description STG Adhere to Safety Precautions With cues/reminders Assistance/Device.  Outcome: Progressing Flowsheets (Taken 10/25/2018 1301) STG:Pt will adhere to safety precautions with assistance/device: 4-Minimal assistance   Problem: RH KNOWLEDGE DEFICIT GENERAL Goal: RH STG INCREASE KNOWLEDGE OF SELF CARE AFTER HOSPITALIZATION Description Pt will be able to state understanding of management of care at discharge using handouts/resources/ independently  Outcome: Progressing

## 2018-10-25 NOTE — Progress Notes (Signed)
Social Work Patient ID: Kyle Mcconnell, male   DOB: 07/20/1956, 63 y.o.   MRN: [id number]   CSW met with pt and his dtr and fiance to update them on team conference discussion and to let them know that no change made to d/c date 10-31-18.  Pt has already tried to have [place] install a ramp, but he acknowledges that it may not be done by d/c.  CSW gave them [employer] information and family will work on this.  [facility] will [facility] and offer to assist with documentation as needed.  CSW will go ahead and start ordering DME and arranging [place] and pt is aware that this needs to be through his insurance, that we could not go through [place] as he would not have it at d/c.  He expressed understanding.  CSW will continue to follow and assist as needed.

## 2018-10-26 DIAGNOSIS — T1490XA Injury, unspecified, initial encounter: Secondary | ICD-10-CM

## 2018-10-26 DIAGNOSIS — S7012XS Contusion of left thigh, sequela: Secondary | ICD-10-CM
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
## 2018-10-27 ENCOUNTER — Inpatient Hospital Stay (HOSPITAL_COMMUNITY): Payer: Federal, State, Local not specified - PPO | Admitting: Occupational Therapy
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
## 2018-10-27 NOTE — Progress Notes (Signed)
Occupational Therapy Session Note  Patient Details  Name: Kyle Mcconnell MRN: [id number] Date of Birth: Jan 14, 1956  Today's Date: 10/27/2018 OT Individual Time: 2774-1287 OT Individual Time Calculation (min): 30 min   Short Term Goals: Week 2:  OT Short Term Goal 1 (Week 2): STG=LTG due to LOS  Skilled Therapeutic Interventions/Progress Updates:    Pt greeted in bed with no c/o pain. Wanting to do the arm bike. Started session with caregiver training and family education. Dtr Tna assisted pt with TLSO while pt was positioned in supine. OT placed a pillow between pts legs for increasing ease of precaution adherence while rolling in bed with bedrail use (completed with supervision). Dtr Tna exhibited good safety with position of TLSO, and adjusting straps as needed. Supine<sit completed with supervision from Tna. She and pt set up slideboard transfer to w/c with min questioning cues from OT regarding w/c setup. Dtr provided Min A by keeping pts legs elevated for B LE NWB during transfer. Tna required min vcs also for applying leg rests after. Once hand washing was completed, pt self propelled to dayroom with vcs for large vs small propulsions for increasing efficiency of technique. To work on [employer], pt completed 4 minutes forward/backward rotations on arm bike. He was left with family to return pt to room.   Pt able to state back and WB precautions during session.   Therapy Documentation Precautions:  Precautions Precautions: Fall, Back Precaution Booklet Issued: No Precaution Comments: log roll, back precautions Required Braces or Orthoses: Spinal Brace, Other Brace Spinal Brace: Thoracolumbosacral orthotic, Applied in supine position Other Brace: R CAM boot Restrictions Weight Bearing Restrictions: Yes RLE Weight Bearing: Non weight bearing LLE Weight Bearing: Non weight bearing ADL:       Therapy/Group: Individual Therapy  Kyandre Okray A Tracy Gerken 10/27/2018, 2:35 PM

## 2018-10-27 NOTE — Progress Notes (Signed)
Followed up with Surgical Service no scheduled time for surgery scheduled yet. Will continue to monitor

## 2018-10-28 ENCOUNTER — Inpatient Hospital Stay (HOSPITAL_COMMUNITY): Payer: Federal, State, Local not specified - PPO | Admitting: Occupational Therapy

## 2018-10-28 ENCOUNTER — Inpatient Hospital Stay (HOSPITAL_COMMUNITY): Payer: Federal, State, Local not specified - PPO

## 2018-10-28 LAB — CBC WITH DIFFERENTIAL/PLATELET
Abs Immature Granulocytes: 0.08 10*3/uL — ABNORMAL HIGH (ref 0.00–0.07)
Basophils Absolute: 0 10*3/uL (ref 0.0–0.1)
Basophils Relative: 0 %
Eosinophils Absolute: 0.1 10*3/uL (ref 0.0–0.5)
Eosinophils Relative: 2 %
HCT: 36.5 % — ABNORMAL LOW (ref 39.0–52.0)
HEMOGLOBIN: 11.8 g/dL — AB (ref 13.0–17.0)
Immature Granulocytes: 1 %
LYMPHS PCT: 28 %
Lymphs Abs: 1.9 10*3/uL (ref 0.7–4.0)
MCH: 29.8 pg (ref 26.0–34.0)
MCHC: 32.3 g/dL (ref 30.0–36.0)
MCV: 92.2 fL (ref 80.0–100.0)
Monocytes Absolute: 0.8 10*3/uL (ref 0.1–1.0)
Monocytes Relative: 11 %
Neutro Abs: 4.1 10*3/uL (ref 1.7–7.7)
Neutrophils Relative %: 58 %
Platelets: 264 10*3/uL (ref 150–400)
RBC: 3.96 MIL/uL — ABNORMAL LOW (ref 4.22–5.81)
RDW: 16.1 % — ABNORMAL HIGH (ref 11.5–15.5)
WBC: 7 10*3/uL (ref 4.0–10.5)
nRBC: 0 % (ref 0.0–0.2)

## 2018-10-28 LAB — CREATININE, SERUM
Creatinine, Ser: 0.75 mg/dL (ref 0.61–1.24)
GFR calc Af Amer: >60 mL/min (ref >60)
GFR calc non Af Amer: >60 mL/min (ref >60)

## 2018-10-28 MED ORDER — DOXYCYCLINE HYCLATE 100 MG PO TABS
100.0000 mg | ORAL_TABLET | Freq: Two times a day (BID) | ORAL | Status: DC
  Administered 2018-10-28 – 2018-10-31 (×7): 100 mg via ORAL
  Filled 2018-10-28 (×7): qty 1
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
## 2018-10-28 NOTE — Progress Notes (Signed)
Occupational Therapy Note  Patient Details  Name: Kyle Mcconnell MRN: [id number] Date of Birth: Jan 28, 1956  Today's Date: 10/28/2018 OT Missed Time: 45 Minutes Missed Time Reason: Patient unwilling/refused to participate without medical reason  Pt missed 45 mins OT treatment session secondary to refusal.  Pt received supine in bed not expecting this therapist.  Pt and fiance report pt recently returning to bed after an intensive PT session and pt reports not feeling up to any activity at this time.  Discussed goals of care and treatment plan this therapist had in mind, however pt continued to refuse.  Pt also reports having a "tough" weekend due to various changes regarding surgery which currently is not happening.  Pt requested to remain in bed to rest for next scheduled therapy session.     Rosalio Loud 10/28/2018, 11:36 AM

## 2018-10-28 NOTE — Progress Notes (Signed)
Physical Therapy Session Note  Patient Details  Name: Kyle Mcconnell MRN: [id number] Date of Birth: 1956-07-05  Today's Date: 10/28/2018 PT Individual Time: 0905-1020, 1300-1310, 1400-1445 PT Individual Time Calculation (min): 75 min   Short Term Goals: Week 2:  PT Short Term Goal 1 (Week 2): Patient will perform set up for slide board transfers with <25% assistance. PT Short Term Goal 2 (Week 2): Patient will perform car transfers with mod A using least restrictive assistive device while maintaining NWB and spinal precautions. PT Short Term Goal 3 (Week 2): Patient will perform bed mobility using least restrictive assistive device with CGA for safety while maintaining spinal precautions.  PT Short Term Goal 4 (Week 2): Patient will propel w/c up/down a ramp with supervision for safety in order to enter and exit his home.   Skilled Therapeutic Interventions/Progress Updates:     [phone] Patient in w/c preparing to transfer back to bed with SO upon PT arrival. Patient alert and agreeable to PT session. Patient reported 9/10 R ankle pain and increased fatigue due to interrupted sleep last night and feeling "foggy" from medication this morning and requested to get back to bed, but was agreeable to performing exercises in bed and sitting EOB this morning.   Therapeutic Activity: Bed Mobility: Patient performed supine to/from sit with min A for LE managment. Provided verbal cues for log rolling to maintain spinal precautions. Performed rolling from supine to/from prone to the L with supervision and use of bed rails. Provided verbal cues for log rolling to maintain spinal precautions.  Transfers: Patient performed slide board transfer from the w/c to the bed with SO providing min A for LE management to maintain WB precautions.with. Provided verbal cues for putting w/c close to the bed to set up for transfer.  Therapeutic Exercise: Patient performed the following exercises with verbal and  tactile cues for proper technique and positioning. -B SLR in supine with active DF x10 -B calf stretch in supine with gentle manual overpressure provided by PT, 30 sec hold x3 -L hip flexion in supine with gentle manual overpressure provided by PT x10 -B LAQ in sitting with active DF and eversion on L x10 -L Eversion/inversion in sitting with foot on floor x10 for 2 sets -B Ankle pumps x10 -PROM of R shoulder flexion in supine x5, no pain -AAROM of R shoulder flexion in supine x5, mild pain with cues for external rotation "thumb up", shoulder depression, and scapular retraction -AAROM in of R shoulder flexion in sitting x2, moderate pain with cues for external rotation "thumb up", shoulder depression, and scapular retraction. Exercise terminated due to pain -Scapular retraction in supine x10 -Scapular retraction in sitting x10  Patient in bed at end of session with breaks locked, bed alarm set, SO at bedside, and all needs within reach.   1300-1310 Patient in bed upon PT arrival. Patient alert and agreeable to PT session with SO at bedside. Patient requested to get to the w/c in order to eat lunch that had just arrived. Patient agreed to completing remaining 50 minutes of session after lunch. SO donned TLSO prior to session.  Therapeutic Activity: Bed Mobility: Patient performed supine to sit with supervision. Provided verbal cues for log rolling land pushing through UE to sit up. Transfers: Patient performed slide board transfer from the bed to the w/c with SO providing min A for LE management . Provided verbal cues for board placement and w/c placement prior to transfer.  Patient in w/c at end  of session with breaks locked, SO in room, and all needs within reach.   [id number] Patient in w/c in the hall with SO to meet PT in the day room at beginning of session. Patient alert and agreeable to PT session.  Therapeutic Activity: Bed Mobility: Patient performed sit to supine with min A for  LE managment. Provided verbal cues for bringing LE up to the bed and log rolling. Transfers: Patient performed side scoot transfers from w/c to/from mat table and from w/c to bed with CGA. Provided demonstration of technique without use of LEs and verbal cues for scooting to the edge of the w/c before clearing the space between the chair and mat table/bed.  Wheelchair Mobility:  Patient propelled wheelchair 250 feet with supervision for cuing. Provided verbal cues for tight turns and parking for transfers. Patient managed w/c breaks and SO managed w/c parts independently throughout session.  Therapeutic Exercise: Patient performed the following exercises with verbal and tactile cues for proper technique. Resting HR 101 and RPE 1/10 at beginning of session. -UE ergometer level 1 sitting in w/c x4 min forward (RPE 6/10 and HR 116 bpm) and x4 min backward (RPE 7/10 and HR 120 bpm) -Green 2 lb ball toss in sitting without back support, patient chose to perform without LE support x4 min x2, 1st set at 5 feet away and 2nd set at 8 feet away; exercise performed for abdominal and UE strengthening and endurance (HR 124 bpm and RPE 7/10)    Patient in bed at end of session with breaks locked, bed alarm set, SO at bedside, and all needs within reach. Patient requested to rest after therapy, however, stated that he would sit in the w/c during and after dinner for >1 hr this evening for improved sitting tolerance.    Therapy Documentation Precautions:  Precautions Precautions: Fall, Back Precaution Booklet Issued: No Precaution Comments: log roll, back precautions Required Braces or Orthoses: Spinal Brace, Other Brace Spinal Brace: Thoracolumbosacral orthotic, Applied in supine position Other Brace: R CAM boot Restrictions Weight Bearing Restrictions: Yes RLE Weight Bearing: Non weight bearing LLE Weight Bearing: Non weight bearing Vital Signs:  Pain:   0905-1020: 9/10 R ankle and distal LE,  constant acute soreness 1300-1310 7/10 R ankle and distal LE, constant acute soreness 1400-1450 6/10 R ankle and distal LE, constant acute soreness   Therapy/Group: Individual Therapy  Helayne [place]in[place], PT, DPT 10/28/2018, 10:42 AM

## 2018-10-28 NOTE — Progress Notes (Signed)
Occupational Therapy Session Note  Patient Details  Name: Kyle Mcconnell MRN: [id number] Date of Birth: 25-Dec-1955  Today's Date: 10/28/2018 OT Individual Time: 3235-5732 OT Individual Time Calculation (min): 60 min    Short Term Goals: Week 2:  OT Short Term Goal 1 (Week 2): STG=LTG due to LOS  Skilled Therapeutic Interventions/Progress Updates:    Pt seen for OT session focusing on functional transfers and ADL re-training. Pt in supine upon arrival with fiance present. Pt reports having had rough weekend with inconsistent information regarding whether he would have surgery today. Surgeon visited pt earlier this AM and informed pt he would not have surgery today, will re-assess on Wednesday. With encouragement, pt willing to get OOB and attempt therapy as able. Pt's finace declined hands on training of donning TLSO and assisting pt with transfer. Supervision to don shirt with VCs to maintain spinal pre-cautions in functional task. TLSO donned total A by therapist. Pt transferred to EOB using hospital bed functions with supervision. Completed sliding board transfer to w/c with assist to place board and CGA. He completed grooming tasks from w/c level mod I. He self propelled w/c to ADL apartment with VCs for effective w/c propulsion techniques. In ADL apartment, pt able toset-up w/c in prep for transfer to low soft surface couch with min VCs from therapist. Completed sliding board transfer onto couch with min A intiaily, however, max A for positioning once on couch. Extensive ducation/demonstration provided to pt and caregiver regarding techniques for sliding board transfer and how to provide mod-max A if needed. Completed sliding board transfer back to w/c, very uphill transfer. Pt requiring max A +2 to stabilize equipment. Recommend pt wait until [facility] therapies to assist with couch transfer at d/c, pt and caregiver voice understanding. Pt returned to room at end of session, set-up with breakfast  tray and all needs in reach.    Noted wound on pt's L shin, pt reporting increased pain at this site. RN and MD made aware.    Therapy Documentation Precautions:  Precautions Precautions: Fall, Back Precaution Booklet Issued: No Precaution Comments: log roll, back precautions Required Braces or Orthoses: Spinal Brace, Other Brace Spinal Brace: Thoracolumbosacral orthotic, Applied in supine position Other Brace: R CAM boot Restrictions Weight Bearing Restrictions: Yes RLE Weight Bearing: Non weight bearing LLE Weight Bearing: Non weight bearing    Therapy/Group: Individual Therapy  Arbor Cohen L 10/28/2018, 6:59 AM

## 2018-10-28 NOTE — Progress Notes (Signed)
Ortho Trauma Progress Note  Incisional vac removed. Wound stable. No drainage. Dressing replaced. No role for surgical intervention at this time. Will recheck wound on Wednesday. Patient may have diet today. Continue NWB RLE/LLE.  Roby Lofts, MD [facility] [phone] (phone)

## 2018-10-29 ENCOUNTER — Inpatient Hospital Stay (HOSPITAL_COMMUNITY): Payer: Federal, State, Local not specified - PPO

## 2018-10-29 ENCOUNTER — Inpatient Hospital Stay (HOSPITAL_COMMUNITY): Payer: Federal, State, Local not specified - PPO | Admitting: Occupational Therapy
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
## 2018-10-29 NOTE — Plan of Care (Signed)
  Problem: Consults Goal: RH GENERAL PATIENT EDUCATION Description See Patient Education module for education specifics. Outcome: Progressing   Problem: RH SKIN INTEGRITY Goal: RH STG SKIN FREE OF INFECTION/BREAKDOWN Description Min assist   Outcome: Progressing Goal: RH STG MAINTAIN SKIN INTEGRITY WITH ASSISTANCE Description STG Maintain Skin Integrity With min Assistance.  Outcome: Progressing Flowsheets (Taken 10/29/2018 1330) STG: Maintain skin integrity with assistance: 3-Moderate assistance Goal: RH STG ABLE TO PERFORM INCISION/WOUND CARE W/ASSISTANCE Description STG Able To Perform Incision/Wound Care With min Assistance.  Outcome: Progressing Flowsheets (Taken 10/29/2018 1330) STG: Pt will be able to perform incision/wound care with assistance: 3-Moderate assistance   Problem: RH SAFETY Goal: RH STG ADHERE TO SAFETY PRECAUTIONS W/ASSISTANCE/DEVICE Description STG Adhere to Safety Precautions With cues/reminders Assistance/Device.  Outcome: Progressing Flowsheets (Taken 10/29/2018 1330) STG:Pt will adhere to safety precautions with assistance/device: 4-Minimal assistance   Problem: RH PAIN MANAGEMENT Goal: RH STG PAIN MANAGED AT OR BELOW PT'S PAIN GOAL Description At  Or below level 5  Outcome: Progressing   Problem: RH KNOWLEDGE DEFICIT GENERAL Goal: RH STG INCREASE KNOWLEDGE OF SELF CARE AFTER HOSPITALIZATION Description Pt will be able to state understanding of management of care at discharge using handouts/resources/ independently  Outcome: Progressing

## 2018-10-29 NOTE — Progress Notes (Signed)
Physical Therapy Session Note  Patient Details  Name: Kyle Mcconnell MRN: [id number] Date of Birth: 09/27/1955  Today's Date: 10/29/2018 PT Individual Time: 1420-1530 PT Individual Time Calculation (min): 70 min   Short Term Goals: Week 3:  PT Short Term Goal 1 (Week 2): Patient will perform set up for slide board transfers with <25% assistance. PT Short Term Goal 2 (Week 2): Patient will perform car transfers with mod A using least restrictive assistive device while maintaining NWB and spinal precautions. PT Short Term Goal 3 (Week 2): Patient will perform bed mobility using least restrictive assistive device with CGA for safety while maintaining spinal precautions.  PT Short Term Goal 4 (Week 2): Patient will propel w/c up/down a ramp with supervision for safety in order to enter and exit his home.   Skilled Therapeutic Interventions/Progress Updates:    Patient in supine rolled for brace donning S and assist with brace total A in supine.  Side to sit S increased time with rail.  Patient transferred to w/c with set up and S with cues for positioning.  Propelled with S partway to dayroom.  UBE x 4' fwd, 4' back at level 1. Manual stretch to pectorals seated 3 x 20 sec. Then scapular retraction with towel roll behind back x 5 w/ 5 sec hold.  Noted pt point tender along bicipital area, also with difficulty holding arm up in empt can position.  Patient propelled to general gym and seated for L shoulder IR/ER/FF/ROW x 5-10 reps initially attempted with orange t-band, then with yellow.  Issued to HEP.  Patient transferred to mat via scooting without board and S.  Patient performed SAQ with ankle DF x 10 reps 5 sec hold.  Shoulder press on L without band x 10, then passive stretch to biceps with arm at edge of mat 3 x 20 sec hold.  Rolled to side and up to sit with S.  Patient assisted to room due to pain in L shoulder.  Scooted to bed no board with S and set up.  Educated to watch set up for transfers  to ensure safest and closest set up possible.  Issued HEP and placed ice on shoulder x 20 min.  Left with all needs in reach.   Therapy Documentation Precautions:  Precautions Precautions: Fall, Back Precaution Booklet Issued: No Precaution Comments: log roll, back precautions Required Braces or Orthoses: Spinal Brace, Other Brace Spinal Brace: Thoracolumbosacral orthotic, Applied in supine position Other Brace: R CAM boot Restrictions Weight Bearing Restrictions: Yes RLE Weight Bearing: Non weight bearing LLE Weight Bearing: Non weight bearing Pain: Pain Assessment Pain Score: 7  Pain Type: Acute pain Pain Location: Shoulder Pain Orientation: Right Pain Descriptors / Indicators: Sharp Pain Onset: With Activity(with reaching) Pain Intervention(s): Repositioned;Ambulation/increased activity    Therapy/Group: Individual Therapy  Elray Mcgregor  Sheran Lawless, PT 10/29/2018, 2:43 PM

## 2018-10-29 NOTE — Progress Notes (Signed)
Occupational Therapy Session Note  Patient Details  Name: Kyle Mcconnell MRN: [id number] Date of Birth: 02-Apr-1956  Today's Date: 10/29/2018 OT Individual Time: 1173-5670 OT Individual Time Calculation (min): 75 min    Short Term Goals: Week 2:  OT Short Term Goal 1 (Week 2): STG=LTG due to LOS  Skilled Therapeutic Interventions/Progress Updates:    Pt seen for OT session focuisng on functional mobility, caregiver training and UE strengthening as part of on-going HEP. Pt in supine upon arrival, denying pain and agreeable to tx session. He denied bathing/dressing routine this morning, reports caregiver assisted him with it last night.  TLSO donned total A by therapist. He transferred to sitting EOB with supervision using hospital bed functions. He completed donned shoe seated EOB with use of shoe horn and VCs for use of elastic shoelaces. Completed sliding board transfer to w/c with supervision and assist to stabilize equipment, VCs for sliding board placement and awareness prior to transfer. Grooming tasks completed mod I from w/c level at sink.  HE self-propelled w/c to therapy gym with supervision for UE strengthening.  Completed uphill transfer to therapy mat with caregiver assisting with set-p of w/c and slide board in prep for transfer and assisted with stabilziation of equipment. Pt required min A and increased time with VCs to complete uphill transfer.  He transferred to supine on mat with mod A. Completed UE ther-ex from supine position, completed the following exercises x10 chest press x10 shoulder flexion to 90 degrees  Returned to sitting upright on mat and transferred into w/c with min A from caregiver. Provided with medium grade (green) theraband. Instructed in theraband exercises, however, pt with increase in R shoulder pain with limited ROM, hx of rotator cuff surgery. Therapy session coming to end and therefore instructed for pt to wait until next therapy session for cont  guidance in UE HEP, pt voiced understanding.    Extensive education provided regarding importance of proper body mechanics during exercises, back pre-catiions (pt able to recall 3/3) and functional strengthening activities to incorportate into ADLS/IADLs.   Pt self propelled w/c back to room at end ofs ession, left seated in w/c with all needs in reach and caregiver present.   Therapy Documentation Precautions:  Precautions Precautions: Fall, Back Precaution Booklet Issued: No Precaution Comments: log roll, back precautions Required Braces or Orthoses: Spinal Brace, Other Brace Spinal Brace: Thoracolumbosacral orthotic, Applied in supine position Other Brace: R CAM boot Restrictions Weight Bearing Restrictions: Yes RLE Weight Bearing: Non weight bearing LLE Weight Bearing: Non weight bearing Pain: Pain Assessment Pain Scale: 0-10 Pain Score: 0-No pain   Therapy/Group: Individual Therapy  [name] L 10/29/2018, 6:59 AM

## 2018-10-29 NOTE — Progress Notes (Signed)
Physical Therapy Session Note  Patient Details  Name: Kyle Mcconnell MRN: [id number] Date of Birth: [birth year]  Today's Date: 10/29/2018 PT Individual Time: 0712-1975 PT Individual Time Calculation (min): 60 min   Short Term Goals: Week 2:  PT Short Term Goal 1 (Week 2): Patient will perform set up for slide board transfers with <25% assistance. PT Short Term Goal 2 (Week 2): Patient will perform car transfers with mod A using least restrictive assistive device while maintaining NWB and spinal precautions. PT Short Term Goal 3 (Week 2): Patient will perform bed mobility using least restrictive assistive device with CGA for safety while maintaining spinal precautions.  PT Short Term Goal 4 (Week 2): Patient will propel w/c up/down a ramp with supervision for safety in order to enter and exit his home.   Skilled Therapeutic Interventions/Progress Updates:     Patient in w/c with SO at bedside upon PT arrival. Patient alert and agreeable to PT session, however, complained of R shoulder pain and fatigue. Pinpoint pain noted at greater tubercle of the humerus with active movement, no pain with PROM.   Therapeutic Activity: Bed Mobility: Patient performed supine to sit on mat table with supervision and sit to supine on mat table and bed with min A for LE management. Provided verbal cues for log rolling and lifting LE's. Transfers: Patient performed slide board transfers to and from a mat table and to the bed with min A for physical assistance. Did not require assistance to maintain LE WB precautions today and performed board placement with <25% assist on L and 50% assist on R, however, needed more assistance for sliding due to R shoulder pain/fatigue. Provided verbal cues for board placement, lifting LE's to place board and during transfer to maintain WB precautions, and hand placement x1.  Wheelchair Mobility:  Patient propelled wheelchair 20 feet with supervision, however, reported increased R  shoulder pain and fatigue and was provided total assist for remaining transport throughout session by SO.   Manual therapy: -Performed soft tissue mobilization to R upper trap, supraspinatus, and deltoid with some pain relief -Performed manual B upper trap stretch in supine for 30 seconds x1 -Performed muscle energy technique for R upper trap in supine for 1 minute x2 with notable increase in ROM  -Performed functional shoulder IR stretch (reaching behind back) in sitting with light overpressure from therapist to tolerance x30 seconds x3 with some increased in ROM  Patient and SO educated on increased susceptibility to overuse injuries in the shoulders due to current functional level for mobility and reason for focus on addressing shoulder pain and fatigue this session. Patient in bed at end of session with breaks locked, bed alarm set, SO at bedside, heating pad applied to L trunk and pelvis, ice pack applied to R shoulder, and all needs within reach.   Therapy Documentation Precautions:  Precautions Precautions: Fall, Back Precaution Booklet Issued: No Precaution Comments: log roll, back precautions Required Braces or Orthoses: Spinal Brace, Other Brace Spinal Brace: Thoracolumbosacral orthotic, Applied in supine position Other Brace: R CAM boot Restrictions Weight Bearing Restrictions: Yes RLE Weight Bearing: Non weight bearing LLE Weight Bearing: Non weight bearing Pain: Pain Assessment Pain Scale: 0-10 Pain Score: 8/10 R shoulder, constant ache    Therapy/Group: Individual Therapy  Helayne Seminole, PT, DPT 10/29/2018, 12:01 PM

## 2018-10-30 ENCOUNTER — Inpatient Hospital Stay (HOSPITAL_COMMUNITY): Payer: Federal, State, Local not specified - PPO

## 2018-10-30 ENCOUNTER — Inpatient Hospital Stay (HOSPITAL_COMMUNITY): Payer: Federal, State, Local not specified - PPO | Admitting: Occupational Therapy

## 2018-10-30 DIAGNOSIS — G8918 Other acute postprocedural pain: Secondary | ICD-10-CM

## 2018-10-30 NOTE — Progress Notes (Signed)
Inpatient Rehab Admissions Coordinator:    Spoke with CSW, Boneta Lucks, who reports patient's insurance in our system has been updated to [employer] as his primary.  Verified via [employer] and [email or web address] when patient was on acute care, and again when patient was admitted to CIR, that Medicare A  was primary policy and [employer] was secondary policy.  Have email confirmation from Adora Fridge in the Pre-Service [facility] on 10/15/2018 that pt's account was updated and [employer] listed as secondary with Medicare A was listed as primary.  Verified, again, on 10/30/2018 via [email or web address] that [employer] is a secondary policy and pt record has been updated to reflect this.    Estill Dooms, PT, DPT Admissions Coordinator [phone] 10/30/18  11:24 AM

## 2018-10-30 NOTE — Progress Notes (Signed)
Occupational Therapy Session Note  Patient Details  Name: Kyle Mcconnell MRN: [id number] Date of Birth: 20-Nov-1955  Today's Date: 10/30/2018 OT Individual Time: 8416-6063 and 1100-1155 OT Individual Time Calculation (min): 8 min and 55 min   Short Term Goals: Week 2:  OT Short Term Goal 1 (Week 2): STG=LTG due to LOS  Skilled Therapeutic Interventions/Progress Updates:    Session One: Pt in supine upon arrival with surgeon exiting. Pt's fiance and daughter present. Pt voicing 9/10 pain in R LE as surgeon just removed sutures. Pt also shaken up by reported nosebleed during the night. He was declining therapy at this time.  Pt's DME for home had arrived. Family made therapist aware of deficit in wooden sliding board and requesting new sliding board. Also noted pt only receiving standard leg rests vs. ELR. Education provided to family regarding difference in leg rests, pros/cons of each and need for elevating leg rests for comfort and edema management. CSW made aware of need in change in DME.  Pt requesting for therapist to return at later time when pain subsided. RN made aware of pt's complaints of pain.   Session Two: Pt seen for OT session focusing on caregiver training and functional transfers. Pt in supine upon arrival with 2 daughters present.  Pt's daughters assisted with all aspects of mobility and equipment set-up throughout session. TLSO donned total A in supine. He completed sliding board transfers throughout session, EOB>w/c, w/c<>drop arm BSC. Required min cuing from therapist initially for proper set-up of equipment, progressing to completing without assist from therapist. CGA overall for transfers and assist to stabilize equipment. Simulated toileting task completed with min A.  Pt able to recall 3/3 back pre-cautions independently. Education provided throughout session from therapist regarding OT/PT goals, continuum of care, DME, w/c parts management, and d/c planning. Handout  provided for stroke support group and encouragement to attend.  Pt left seated in w/c at end of session, all needs in reach and family present.   Therapy Documentation Precautions:  Precautions Precautions: Fall, Back Precaution Booklet Issued: No Precaution Comments: log roll, back precautions Required Braces or Orthoses: Spinal Brace, Other Brace Spinal Brace: Thoracolumbosacral orthotic, Applied in supine position Other Brace: R CAM boot Restrictions Weight Bearing Restrictions: Yes RLE Weight Bearing: Non weight bearing LLE Weight Bearing: Non weight bearing   Therapy/Group: Individual Therapy  Jaimes Eckert L 10/30/2018, 6:44 AM

## 2018-10-30 NOTE — Progress Notes (Signed)
Patient c/o nosebleed to right nare this morning. Bleeding had already stopped when rn came in. Packed right nare with gauze to prevent further bleed. Ice pack applied to forehead intermittently. Per patient, he has history of epistaxis in the past. Will monitor.
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
## 2018-10-30 NOTE — Progress Notes (Signed)
Physical Therapy Session Note  Patient Details  Name: Kyle Mcconnell MRN: [id number] Date of Birth: 1956-07-14  Today's Date: 10/30/2018 PT Individual Time: 5789-7847 and 8412-8208 PT Individual Time Calculation (min): 60 min and 60 min  Short Term Goals: Week 2:  PT Short Term Goal 1 (Week 2): Patient will perform set up for slide board transfers with <25% assistance. PT Short Term Goal 2 (Week 2): Patient will perform car transfers with mod A using least restrictive assistive device while maintaining NWB and spinal precautions. PT Short Term Goal 3 (Week 2): Patient will perform bed mobility using least restrictive assistive device with CGA for safety while maintaining spinal precautions.  PT Short Term Goal 4 (Week 2): Patient will propel w/c up/down a ramp with supervision for safety in order to enter and exit his home.   Skilled Therapeutic Interventions/Progress Updates:     First Session: Patient in bed upon PT arrival. Patient alert and agreeable to PT session.  Therapeutic Activity: Bed Mobility: Patient performed supine to/from sit with supervision for safety performing log roll to the L to maintain back precautions.   Transfers: Patient performed slide board transfer from bed to w/c with his daughter providing set up and CGA for safety and balance. Daughter demonstrated safe guarding and assistance during transfer without cueing.  Wheelchair Mobility:  Patient propelled wheelchair 200 feet with 2 90 degree turns and through a narrow doorway with mod I. Patient's personal w/c was delivered today. PT adjusted ELR to fit patient and provided education to patient and family about parts and how to adjust accessories. Patient did not sit well with back support that was provided, but sat well with the sling back with the TLSO.  Therapeutic Exercise: Patient performed the following exercises in sitting with verbal and tactile cues for proper technique. -B LAQ x15 for 2 sets -B  marching x20 for 2 sets -B ankle pumps x20 for 2 sets Patient in bed at end of session with breaks locked, bed alarm set, daughters at bedside, and all needs within reach.   Seconds Session: Patient in bed upon PT arrival with daughters at bedside. Patient alert and agreeable to PT session.  Therapeutic Activity: Bed Mobility: Patient performed supine to/from sit with supervision for safety performing log roll to the L to maintain back precautions.   Transfers: Patient performed slide board transfer from bed to/from w/c with CGA and car transfers with CGA to min A with his daughter providing set up assistance needed for safety and balance. Daughter demonstrated safe guarding and assistance during transfer without cueing.  Wheelchair Mobility:  Patient went up and down a ramp with CGA from his daughter x1 and total assist with his daughter pulling the w/c up backwards and down forwards for improved body mechanics for total assist transport.  Therapeutic Exercise: Patient performed the following exercises with verbal and tactile cues for proper technique. -Ball toss in sitting with no LE or UE support x2 min for 3 sets  Patient in bed at end of session with breaks locked, bed alarm set, daughters at bedside, and all needs within reach.    Therapy Documentation Precautions:  Precautions Precautions: Fall, Back Precaution Booklet Issued: No Precaution Comments: log roll, back precautions Required Braces or Orthoses: Spinal Brace Spinal Brace: Thoracolumbosacral orthotic, Applied in supine position Restrictions Weight Bearing Restrictions: Yes RLE Weight Bearing: Non weight bearing LLE Weight Bearing: Non weight bearing    Pain:  6/10 R shoulder pain, constant aching   Therapy/Group:  Individual Therapy  Helayne [place]in[place], PT, DPT 10/30/2018, 4:54 PM

## 2018-10-30 NOTE — Progress Notes (Signed)
Occupational Therapy Discharge Summary  Patient Details  Name: Kyle Mcconnell MRN: [id number] Date of Birth: 20-Dec-1955   Patient has met 9 of 9 long term goals due to improved activity tolerance, improved balance, postural control, ability to compensate for deficits, functional use of  RIGHT upper extremity, improved attention, improved awareness and improved coordination.  Patient to discharge at [facility] Assist level.  Patient's care partner is independent to provide the necessary physical assistance at discharge.  Pt's fiancee and daughters have all completed hands on family education sessions. Pt's fiancee has been present throughout rehab admission and is aware of the care pt requires at d/c. She has voiced and demonstrated willing and ableness to provide the needed assist at d/c. Pt completing functional transfers using sliding board with close [place]. Bathing/dressing being completed from bed level with set-up and supervision for maintaining pre-cautions in functional tasks.  Pt and family ready for d/c home  Recommendation:  Patient will benefit from ongoing skilled OT services in home health setting to continue to advance functional skills in the area of BADL and Reduce care partner burden.  Equipment: sliding board, heavy duty drop arm BSC, and hospital bed  Reasons for discharge: treatment goals met and discharge from hospital  Patient/family agrees with progress made and goals achieved: Yes  OT Discharge Precautions/Restrictions  Precautions Precautions: Fall;Back Required Braces or Orthoses: Spinal Brace Spinal Brace: Thoracolumbosacral orthotic;Applied in supine position Restrictions Weight Bearing Restrictions: Yes RLE Weight Bearing: Non weight bearing LLE Weight Bearing: Non weight bearing Vision Baseline Vision/History: Wears glasses Wears Glasses: Reading only Patient Visual Report: No change from baseline Vision Assessment?: No apparent visual  deficits Perception  Perception: Within Functional Limits Praxis Praxis: Intact Cognition Overall Cognitive Status: Within Functional Limits for tasks assessed Arousal/Alertness: Awake/alert Orientation Level: Oriented X4 Selective Attention: Appears intact Memory: Impaired Memory Impairment: Decreased short term memory;Decreased recall of new information Decreased Short Term Memory: Verbal basic;Functional basic Problem Solving: Impaired Reasoning: Appears intact Safety/Judgment: Appears intact Sensation Sensation Light Touch: Impaired Detail Peripheral sensation comments: R toes decreased Light Touch Impaired Details: Impaired RLE Coordination Gross Motor Movements are Fluid and Coordinated: No Fine Motor Movements are Fluid and Coordinated: Yes Coordination and Movement Description: Impaired 2/2 WBing pre-cautions Motor  Motor Motor: Within Functional Limits;Other (comment) Motor - Discharge Observations: Impaired 2/2 WBing pre-cautions and pain   Trunk/Postural Assessment  Cervical Assessment Cervical Assessment: Within Functional Limits Thoracic Assessment Thoracic Assessment: Exceptions to WFL(Back pre-cautions; TLSO) Lumbar Assessment Lumbar Assessment: Exceptions to WFL(Back pre-cautions and TLSO) Postural Control Postural Control: Within Functional Limits  Balance Balance Balance Assessed: Yes Static Sitting Balance Static Sitting - Balance Support: Feet supported;No upper extremity supported Static Sitting - Level of Assistance: 6: Modified independent (Device/Increase time) Static Sitting - Comment/# of Minutes: Sitting EOB Dynamic Sitting Balance Dynamic Sitting - Balance Support: During functional activity;Feet supported;No upper extremity supported Dynamic Sitting - Level of Assistance: 5: Stand by assistance Extremity/Trunk Assessment RUE Assessment RUE Assessment: Exceptions to [facility] Passive Range of Motion (PROM) Comments: unable to tolerate PROM  past ~100 degrees shoulder flexion due to pain Active Range of Motion (AROM) Comments: ~80 degrees shoulder flexion without pain, Full ROM in all other joints RUE AROM (degrees) Overall AROM Right Upper Extremity: Due to pain RUE Strength RUE Overall Strength: Within Functional Limits for tasks performed LUE Assessment LUE Assessment: Within Functional Limits General Strength Comments: Grossly 5/5 throughout   Regine Christian L 10/30/2018, 7:13 AM

## 2018-10-30 NOTE — Progress Notes (Signed)
Physical Therapy Discharge Summary  Patient Details  Name: Kyle Mcconnell MRN: [id number] Date of Birth: June 01, 1956  Today's Date: 10/30/2018 PT Individual Time: 1435-1535 PT Individual Time Calculation (min): 60 min    Patient has met 7 of 7 long term goals due to improved activity tolerance, improved balance, improved postural control, increased strength, decreased pain, ability to compensate for deficits, functional use of  right upper extremity, improved attention, improved awareness and improved coordination.  Patient to discharge at a wheelchair level [place].   Patient's care partner is independent to provide the necessary physical assistance at discharge.   Recommendation:  Patient will benefit from ongoing skilled PT services in home health setting to continue to advance safe functional mobility, address ongoing impairments in strength, ROM, balance, functional mobility, activity tolerance, skin integrity, back and B NWB precautions, and minimize fall risk.  Equipment: slide board, 18x18 wheelchair with ELRs  Reasons for discharge: treatment goals met  Patient/family agrees with progress made and goals achieved: Yes  PT Discharge Precautions/Restrictions Precautions Precautions: Fall;Back Precaution Booklet Issued: No Precaution Comments: log roll, back precautions Required Braces or Orthoses: Spinal Brace Spinal Brace: Thoracolumbosacral orthotic;Applied in supine position Restrictions Weight Bearing Restrictions: Yes RLE Weight Bearing: Non weight bearing LLE Weight Bearing: Non weight bearing Vision/Perception  Perception Perception: Within Functional Limits Praxis Praxis: Intact  Cognition Overall Cognitive Status: Within Functional Limits for tasks assessed Arousal/Alertness: Awake/alert Orientation Level: Oriented X4 Attention: Sustained;Selective Focused Attention: Appears intact Sustained Attention: Appears intact Selective Attention: Appears  intact Memory: Appears intact Safety/Judgment: Appears intact Comments: Patient demonstrates good safety awareness with all functional mobility and when cuing his family to assist him. Sensation Sensation Light Touch: Impaired Detail Peripheral sensation comments: R toes decreased Light Touch Impaired Details: Impaired RLE Coordination Gross Motor Movements are Fluid and Coordinated: No Fine Motor Movements are Fluid and Coordinated: No Coordination and Movement Description: Impaired 2/2 WBing pre-cautions, increased time with fine motor tasks with R UE due to decreased strength Motor  Motor Motor: Other (comment) Motor - Skilled Clinical Observations: very mild R hemi; impaired due to [place] pre-cautions and pain Motor - Discharge Observations: Impaired 2/2 WBing pre-cautions and pain  Mobility Bed Mobility Bed Mobility: Sitting - Scoot to Edge of Bed;Rolling Right;Rolling Left;Right Sidelying to Sit;Sit to Sidelying Right Rolling Right: Supervision/verbal cueing(with use of bed rails) Rolling Left: Supervision/Verbal cueing(with use of bed rail) Right Sidelying to Sit: Supervision/Verbal cueing(with use of bed rail) Supine to Sit: Supervision/Verbal cueing Sitting - Scoot to Edge of Bed: Supervision/Verbal cueing Sit to Sidelying Right: Supervision/Verbal cueing Transfers Transfers: Lateral/Scoot Transfers Lateral/Scoot Transfers: Contact Guard/Touching assist;Minimal Assistance - Patient > 75% Transfer (Assistive device): Other (Comment)(slide board) Locomotion  Gait Ambulation: No Gait Gait: No Stairs / Additional Locomotion Stairs: No Wheelchair Mobility Wheelchair Mobility: Yes Wheelchair Assistance: Set up Lexicographer: Both upper extremities Wheelchair Parts Management: Needs assistance Distance: 200'  Trunk/Postural Assessment  Cervical Assessment Cervical Assessment: Within Functional Limits Thoracic Assessment Thoracic Assessment: Exceptions  to WFL(back precautions, TLSO) Lumbar Assessment Lumbar Assessment: Exceptions to WFL(back precautions, TLSO) Postural Control Postural Control: Within Functional Limits  Balance Balance Balance Assessed: Yes Static Sitting Balance Static Sitting - Balance Support: Feet unsupported;No upper extremity supported Static Sitting - Level of Assistance: 7: Independent Static Sitting - Comment/# of Minutes: sitting EOB  Dynamic Sitting Balance Dynamic Sitting - Balance Support: No upper extremity supported;Feet unsupported;During functional activity Dynamic Sitting - Level of Assistance: 5: Stand by assistance Dynamic Sitting - Balance Activities:  Ball toss Sitting balance - Comments: Sitting edge of w/c or mat table, without back support tossing a 2 lb ball; reaching limited by back precautions Extremity Assessment  RUE Assessment RUE Assessment: Exceptions to [facility] Passive Range of Motion (PROM) Comments: unable to tolerate PROM past ~90 degrees shoulder flexion due to pain Active Range of Motion (AROM) Comments: ~80 degrees shoulder flexion without pain, Full ROM in all other joints General Strength Comments: grossly decreased with functional mobility, at least 3+/5 within pain free range RUE AROM (degrees) Overall AROM Right Upper Extremity: Due to pain RUE Strength RUE Overall Strength: Within Functional Limits for tasks performed LUE Assessment LUE Assessment: Within Functional Limits General Strength Comments: WFL for all functional mobility RLE Assessment RLE Assessment: Exceptions to [facility] Passive Range of Motion (PROM) Comments: Ankle DF to neutral only, limited by pain Active Range of Motion (AROM) Comments: Ankle DF to lacking approx 5 degrees from neutral, limited by pain General Strength Comments: Grossly at least 3/5 throuhgout except DF 2+/5, unable to formally assess due to NWB restrictions and pain LLE Assessment LLE Assessment: Exceptions to [facility] Passive Range of Motion  (PROM) Comments: Hip flexion approx 100 degrees limited by pain, ankle DF to neutral with firm end feel Active Range of Motion (AROM) Comments: Hip flexion approx 90 degrees, limited by pain, ankle DF to neutral General Strength Comments: Grossly at least 3/5 throughout except DF 2+/5, unable to formally assess due to NWB restrictions and pain    Basha Krygier L Liviya Santini, PT, DPT 10/30/2018, 4:50 PM
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
## 2018-10-30 NOTE — Progress Notes (Signed)
Ortho trauma progress note  Wound stable. Sutures removed. Plan to follow up for wound check next Tuesday. Daily dressing changes with 4x4s and ACE wrap. Okay to discharge from ortho standpoint.  Roby Lofts, MD [facility] [phone] (phone)

## 2018-10-31 MED ORDER — TEMAZEPAM 30 MG PO CAPS: 30.0000 mg | ORAL_CAPSULE | Freq: Every day | ORAL | 0 refills | Status: AC

## 2018-10-31 MED ORDER — TRAMADOL HCL 50 MG PO TABS: 50.0000 mg | ORAL_TABLET | Freq: Four times a day (QID) | ORAL | 0 refills | Status: DC | PRN

## 2018-10-31 MED ORDER — PRAVASTATIN SODIUM 40 MG PO TABS: 40.0000 mg | ORAL_TABLET | Freq: Every day | ORAL | 0 refills | Status: DC

## 2018-10-31 MED ORDER — DOXYCYCLINE HYCLATE 100 MG PO TABS: 100.0000 mg | ORAL_TABLET | Freq: Two times a day (BID) | ORAL | 0 refills | Status: DC

## 2018-10-31 MED ORDER — ACETAMINOPHEN 325 MG PO TABS: 650.0000 mg | ORAL_TABLET | Freq: Four times a day (QID) | ORAL | Status: DC

## 2018-10-31 MED ORDER — ASCORBIC ACID 500 MG PO TABS: 500.0000 mg | ORAL_TABLET | Freq: Every day | ORAL | 0 refills | Status: DC

## 2018-10-31 MED ORDER — PREGABALIN 75 MG PO CAPS: 75.0000 mg | ORAL_CAPSULE | Freq: Two times a day (BID) | ORAL | 0 refills | Status: DC

## 2018-10-31 MED ORDER — ALPRAZOLAM 2 MG PO TABS: 2.0000 mg | ORAL_TABLET | Freq: Two times a day (BID) | ORAL | 0 refills | Status: AC

## 2018-10-31 MED ORDER — OMEPRAZOLE 20 MG PO CPDR: 40.0000 mg | DELAYED_RELEASE_CAPSULE | Freq: Every day | ORAL | 0 refills | Status: AC

## 2018-10-31 MED ORDER — ASPIRIN 325 MG PO TBEC: 325.0000 mg | DELAYED_RELEASE_TABLET | Freq: Every day | ORAL | 0 refills | Status: AC

## 2018-10-31 MED ORDER — METHOCARBAMOL 500 MG PO TABS: 500.0000 mg | ORAL_TABLET | Freq: Three times a day (TID) | ORAL | 0 refills | Status: DC
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
## 2018-11-01 NOTE — Progress Notes (Signed)
Social Work Patient ID: Kyle Mcconnell, male   DOB: 1956/05/02, 63 y.o.   MRN: [id number]   CSW met with pt and family to update them on team conference discussion and that pt is still on track for d/c on 10-31-18.  They feel prepared for d/c.  CSW helped dtrs with FMLA paperwork.  DME ordered and [place] arranged.  Pt and family were very appreciative of CIR care.

## 2018-11-01 NOTE — Discharge Instructions (Signed)
Alteplase Treatment for Ischemic Stroke  Alteplase is a medicine that can dissolve blood clots. An ischemic stroke is caused by blood clots that block blood flow to the brain. Alteplase can help treat a stroke if it is given very shortly after stroke symptoms begin. It is most effective when it is used within 0-4 hours after the start of symptoms. Before giving this treatment, the health care provider will carefully consider whether the treatment is appropriate based on your age, condition, and other factors. Tell a health care provider about:  Any allergies you have.  All medicines you are taking, including blood thinners, vitamins, herbs, and over-the-counter medicines.  Any medical conditions you have.  Any blood disorders you have.  Any active bleeding in the last 21 days, including gastrointestinal (GI) or vaginal bleeding.  Any surgeries or procedures you have had.  Whether you are pregnant or may be pregnant.  When your stroke symptoms started. What are the risks? Generally, this is a safe treatment. However, problems may occur, including:  Bleeding into the brain.  Bleeding in other parts of the body.  Allergic reaction. What happens before the treatment?  Your health care provider will perform a physical exam and take a detailed medical history.  Your blood pressure, heart rate, and breathing rate (vital signs) will be monitored closely. You may be given medicines to adjust blood pressure, if needed.  You may have tests, including: ? Blood tests. ? A CT scan. What happens during the treatment?  An IV will be inserted into one of your veins.  Alteplase will be given to you through this IV, usually over the course of 1 hour.  In some cases, this medicine may be given directly into the affected artery through a thin, flexible tube (catheter). This is usually inserted in the artery at the top of your leg.  Your vital signs and brain function will be monitored  closely as you receive this medicine.  If bleeding occurs, the medicine will be stopped and appropriate therapy will be started. What can I expect after treatment?  You will be monitored frequently by your health care team in an intensive care unit or a stroke unit.  You will be evaluated by a speech therapist, physical therapist, or an occupational therapist.  If you had a catheter, you may have bruising, soreness, and swelling at the catheter insertion site.  It may take several days, weeks, or even months to fully determine how you responded to the alteplase treatment. Follow these instructions in the hospital: Medicines  Take over-the-counter and prescription medicines only as told by your health care provider. Activity  Do not get out of bed without help. This is for your safety.  Limit activity after your treatment.  Participate in stroke rehabilitation programs as told by your health care provider. General instructions  Tell a nurse or health care provider right away if you have any bleeding, bruising or injuries.  Use a soft-bristled toothbrush. Brush your teeth gently. Get help right away if you have:  Blood in your vomit, stool, or urine.  A serious fall or accident, or you hit your head.  Symptoms of an allergic reaction such as rash or difficulty breathing.  Any symptoms of a stroke. "BE FAST" is an easy way to remember the main warning signs: ? B - Balance. Signs are dizziness, sudden trouble walking, or loss of balance. ? E - Eyes. Signs are trouble seeing or a sudden change in how you see. ?  F - Face. Signs are sudden weakness or loss of feeling in the face, or the face or eyelid drooping on one side. ? A - Arms. Signs are weakness or loss of feeling in an arm. This happens suddenly and usually on one side of the body. ? S - Speech. Signs are sudden trouble speaking, slurred speech, or trouble understanding what people say. ? T - Time. Time to call emergency  services. Write down what time symptoms started.  Other signs of stroke, such as: ? A sudden, very bad headache with no known cause. ? Nausea or vomiting. ? Seizure. These symptoms may represent a serious problem that is an emergency. Do not wait to see if the symptoms will go away. Get medical help right away.  Summary  Alteplase is a medicine that can dissolve blood clots. It can be used to open blocked arteries during an ischemic stroke.  To be effective, this medicine must be given very shortly after stroke symptoms begin, within 4 hours after the start of symptoms.  You will be monitored closely by your health care team during and after receiving alteplase.  Get help right away if you are showing signs of bleeding or are having symptoms of stroke. This information is not intended to replace advice given to you by your health care provider. Make sure you discuss any questions you have with your health care provider. Document Released: 01/17/2008 Document Revised: 08/28/2017 Document Reviewed: 08/28/2017 Elsevier Interactive Patient Education  2019 [employer]. Inpatient Rehab Discharge Instructions  SAMISONI SWARTLEY Discharge date and time: No discharge date for patient encounter.   Activities/Precautions/ Functional Status: Activity: nonweightbearing bilateral lower extremities. TLSO back brace when out of bed Diet: regular diet Wound Care: keep wound clean and dry Functional status:  ___ No restrictions     ___ Walk up steps independently ___ 24/7 supervision/assistance   ___ Walk up steps with assistance ___ Intermittent supervision/assistance  ___ Bathe/dress independently ___ Walk with walker     _x__ Bathe/dress with assistance ___ Walk Independently    ___ Shower independently ___ Walk with assistance    ___ Shower with assistance ___ No alcohol     ___ Return to work/school ________  COMMUNITY REFERRALS UPON DISCHARGE:   Home Health:   PT     OT      RN     Agency:  [facility] Phone:  [phone] Medical Equipment/Items Ordered:  Semi-electric hospital bed; 18"x18" with basic back and cushion and elevating leg rests;  Wide drop arm commode; 30" slide board  Agency/Supplier:  [employer]           Phone:  [phone]  GENERAL COMMUNITY RESOURCES FOR PATIENT/FAMILY: Support Groups:  [facility] Stroke Support Group - ON HOLD RIGHT NOW DUE TO COVID-19                              Meets the second Thursday of each month from 6 - 7 PM (except June, July, August)                              [facility] [place], [place], [facility] Dayroom                               For more  information, call [phone]  Special Instructions: No driving or smoking   My questions have been answered and I understand these instructions. I will adhere to these goals and the provided educational materials after my discharge from the hospital.  Patient/Caregiver Signature _______________________________ Date __________  Clinician Signature _______________________________________ Date __________  Please bring this form and your medication list with you to all your follow-up doctor's appointments.
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
## 2018-11-04 ENCOUNTER — Telehealth: Payer: Self-pay | Admitting: Registered Nurse

## 2018-11-04 ENCOUNTER — Telehealth: Payer: Self-pay

## 2018-11-04 NOTE — Telephone Encounter (Signed)
Transitional Care call Transitional Call Questions answered by Daughter [name] Age  Patient name: Kyle Mcconnell. [place]  DOB: Sep 06, 1955 1. Are you/is patient experiencing any problems since coming home? No a. Are there any questions regarding any aspect of care? No 2. Are there any questions regarding medications administration/dosing? No a. Are meds being taken as prescribed? Yes b. "Patient should review meds with caller to confirm" Medication List Reviewed 3. Have there been any falls? No 4. Has Home Health been to the house and/or have they contacted you? Yes, [facility] a. If not, have you tried to contact them? NA b. Can we help you contact them? NA 5. Are bowels and bladder emptying properly? Yes a. Are there any unexpected incontinence issues? No b. If applicable, is patient following bowel/bladder programs? NA 6. Any fevers, problems with breathing, unexpected pain? No 7. Are there any skin problems or new areas of breakdown? No 8. Has the patient/family member arranged specialty MD follow up (ie cardiology/neurology/renal/surgical/etc.)?  [name] will call for HFU appointments today.  a. Can we help arrange? NA 9. Does the patient need any other services or support that we can help arrange? No 10. Are caregivers following through as expected in assisting the patient? Yes 11. Has the patient quit smoking, drinking alcohol, or using drugs as recommended? [name] states [name] doesn't smoke, drink alcohol or use illicit drugs.   Appointment date/time 11/12/2018  arrival time 1:00 for 1:20 appointment with Jones Bales ANP-C. At [address] [facility] [address]

## 2018-11-04 NOTE — Telephone Encounter (Signed)
Kyle Mcconnell PT [facility] called requesting verbal orders for 1xwk X 1wk followed by 2xwk X 1wk followed by 1xwk X 5wks.  In addition he requests a Child psychotherapist as the patient is having difficulty with travel arrangements for appointments. He also stated needed clarification on patients medications as well as wether or not the patient is nonweightbearing on both his legs, when he is to wear the back brace and if it is to be placed on in the bed or if It can be placed on at the edge of the bed, and lastly requesting a copy of the discharge from the hospital which has the majority of this information on it.  Approved verbal orders and social worker and faxed discharge notes, but am unsure on when the patient is to place on the back brace, in the bed or ok to place on at the edge of the bed.  Please advise on that portion.

## 2018-11-04 NOTE — Telephone Encounter (Signed)
Brandon OT [facility] called requesting verbal orders for 2xwk X 4wks followed by 1xwk X 3wks.  Called him back and approved verbal orders.

## 2018-11-05 NOTE — Telephone Encounter (Signed)
All of this information is in the discharge summary that was provided to the patient as well as provided to the patient and family. Thanks.

## 2018-11-06 ENCOUNTER — Telehealth: Payer: Self-pay

## 2018-11-06 NOTE — Telephone Encounter (Signed)
Felicia Cofield caregiver of patient called stating patient is constipation, having pain due to constipation. She gave him a bottle of Magnesium but did not move bowels much. Wants to know what else he can be given?

## 2018-11-06 NOTE — Telephone Encounter (Signed)
She can try milk of magnesia.  She can also start him on MiraLAX twice a day. Thanks.

## 2018-11-12 ENCOUNTER — Encounter: Payer: Federal, State, Local not specified - PPO | Attending: Registered Nurse | Admitting: Registered Nurse

## 2018-11-12 ENCOUNTER — Encounter: Payer: Self-pay | Admitting: Registered Nurse

## 2018-11-12 ENCOUNTER — Other Ambulatory Visit: Payer: Self-pay

## 2018-11-12 VITALS — BP 130/88 | HR 105 | Temp 97.8°F

## 2018-11-12 DIAGNOSIS — G8918 Other acute postprocedural pain: Secondary | ICD-10-CM

## 2018-11-12 DIAGNOSIS — Z8781 Personal history of (healed) traumatic fracture: Secondary | ICD-10-CM

## 2018-11-12 DIAGNOSIS — S32452A Displaced transverse fracture of left acetabulum, initial encounter for closed fracture: Secondary | ICD-10-CM | POA: Diagnosis present

## 2018-11-12 DIAGNOSIS — Z87891 Personal history of nicotine dependence: Secondary | ICD-10-CM | POA: Insufficient documentation

## 2018-11-12 DIAGNOSIS — I10 Essential (primary) hypertension: Secondary | ICD-10-CM | POA: Diagnosis not present

## 2018-11-12 DIAGNOSIS — Z9889 Other specified postprocedural states: Secondary | ICD-10-CM

## 2018-11-12 DIAGNOSIS — I639 Cerebral infarction, unspecified: Secondary | ICD-10-CM | POA: Diagnosis present

## 2018-11-12 DIAGNOSIS — I70209 Unspecified atherosclerosis of native arteries of extremities, unspecified extremity: Secondary | ICD-10-CM | POA: Insufficient documentation

## 2018-11-12 DIAGNOSIS — F4312 Post-traumatic stress disorder, chronic: Secondary | ICD-10-CM

## 2018-11-12 DIAGNOSIS — F329 Major depressive disorder, single episode, unspecified: Secondary | ICD-10-CM | POA: Diagnosis not present

## 2018-11-12 DIAGNOSIS — T07XXXA Unspecified multiple injuries, initial encounter: Secondary | ICD-10-CM | POA: Diagnosis not present

## 2018-11-12 DIAGNOSIS — S329XXA Fracture of unspecified parts of lumbosacral spine and pelvis, initial encounter for closed fracture: Secondary | ICD-10-CM

## 2018-11-12 DIAGNOSIS — S22089A Unspecified fracture of T11-T12 vertebra, initial encounter for closed fracture: Secondary | ICD-10-CM

## 2018-11-12 DIAGNOSIS — S82871A Displaced pilon fracture of right tibia, initial encounter for closed fracture: Secondary | ICD-10-CM

## 2018-11-12 MED ORDER — TRAMADOL HCL 50 MG PO TABS: 50.0000 mg | ORAL_TABLET | Freq: Four times a day (QID) | ORAL | 0 refills | Status: DC | PRN
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
## 2018-11-18 ENCOUNTER — Other Ambulatory Visit: Payer: Self-pay | Admitting: Student

## 2018-11-18 DIAGNOSIS — S22088A Other fracture of T11-T12 vertebra, initial encounter for closed fracture: Secondary | ICD-10-CM

## 2018-11-22 ENCOUNTER — Other Ambulatory Visit: Payer: Self-pay | Admitting: Student

## 2018-11-22 DIAGNOSIS — S22088A Other fracture of T11-T12 vertebra, initial encounter for closed fracture: Secondary | ICD-10-CM

## 2018-11-28 ENCOUNTER — Other Ambulatory Visit: Payer: Self-pay

## 2018-11-28 ENCOUNTER — Ambulatory Visit
Admission: RE | Admit: 2018-11-28 | Discharge: 2018-11-28 | Disposition: A | Discharge status: Home / Self Care | Payer: Medicare Other | Source: Ambulatory Visit | Attending: Student | Admitting: Student

## 2018-11-28 DIAGNOSIS — S22088A Other fracture of T11-T12 vertebra, initial encounter for closed fracture: Secondary | ICD-10-CM

## 2018-12-04 ENCOUNTER — Other Ambulatory Visit: Payer: Self-pay

## 2018-12-04 ENCOUNTER — Ambulatory Visit (INDEPENDENT_AMBULATORY_CARE_PROVIDER_SITE_OTHER): Payer: Self-pay

## 2018-12-04 ENCOUNTER — Encounter (INDEPENDENT_AMBULATORY_CARE_PROVIDER_SITE_OTHER): Payer: Self-pay | Admitting: Family

## 2018-12-04 ENCOUNTER — Ambulatory Visit (INDEPENDENT_AMBULATORY_CARE_PROVIDER_SITE_OTHER): Payer: Federal, State, Local not specified - PPO | Admitting: Orthopedic Surgery

## 2018-12-04 VITALS — Ht 73.0 in | Wt 185.2 lb

## 2018-12-04 DIAGNOSIS — I63412 Cerebral infarction due to embolism of left middle cerebral artery: Secondary | ICD-10-CM

## 2018-12-04 DIAGNOSIS — M25571 Pain in right ankle and joints of right foot: Secondary | ICD-10-CM | POA: Diagnosis not present

## 2018-12-04 DIAGNOSIS — T847XXA Infection and inflammatory reaction due to other internal orthopedic prosthetic devices, implants and grafts, initial encounter: Secondary | ICD-10-CM
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
## 2018-12-05 ENCOUNTER — Encounter (HOSPITAL_COMMUNITY): Payer: Self-pay | Admitting: *Deleted

## 2018-12-05 ENCOUNTER — Ambulatory Visit (INDEPENDENT_AMBULATORY_CARE_PROVIDER_SITE_OTHER): Payer: Self-pay | Admitting: Physician Assistant

## 2018-12-05 ENCOUNTER — Other Ambulatory Visit: Payer: Self-pay
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
## 2018-12-05 NOTE — Progress Notes (Signed)
Mr Agosta asked that I speak to his daughter Star Age, "she's my POA."  Mr Monague's  PCP is at [facility]."  Patient has a history of HTN, blood pressure was low while he was in the hospital and he was discharged with instructions to not take Blood Pressure Medications. While I was speaking to Mr Montasgue's daughter, patient received a call from his PCP.  I asked Inetta Fermo to call me back if she gave him instructions to change mediations, she said she will.  Patient denies that she or her family has experienced any of the following: Cough Fever >100.4 Runny Nose Sore Throat Difficulty breathing/ shortness of breath Travel in past 14 days- none

## 2018-12-06 ENCOUNTER — Inpatient Hospital Stay (HOSPITAL_COMMUNITY): Payer: Medicare Other | Admitting: Physician Assistant

## 2018-12-06 ENCOUNTER — Inpatient Hospital Stay (HOSPITAL_COMMUNITY): Payer: Medicare Other

## 2018-12-06 ENCOUNTER — Encounter (HOSPITAL_COMMUNITY)
Admission: RE | Disposition: A | Discharge status: Home / Self Care | Payer: Self-pay | Source: Home / Self Care | Attending: Orthopedic Surgery

## 2018-12-06 ENCOUNTER — Other Ambulatory Visit: Payer: Self-pay

## 2018-12-06 ENCOUNTER — Telehealth: Payer: Self-pay | Admitting: Physical Medicine & Rehabilitation

## 2018-12-06 ENCOUNTER — Encounter (HOSPITAL_COMMUNITY): Payer: Self-pay

## 2018-12-06 ENCOUNTER — Inpatient Hospital Stay (HOSPITAL_COMMUNITY)
Admission: RE | Admit: 2018-12-06 | Discharge: 2018-12-13 | DRG: 464 | Disposition: A | Discharge status: Home / Self Care | Payer: Medicare Other | Attending: Orthopedic Surgery | Admitting: Orthopedic Surgery

## 2018-12-06 DIAGNOSIS — F329 Major depressive disorder, single episode, unspecified: Secondary | ICD-10-CM | POA: Diagnosis present

## 2018-12-06 DIAGNOSIS — K219 Gastro-esophageal reflux disease without esophagitis: Secondary | ICD-10-CM | POA: Diagnosis present

## 2018-12-06 DIAGNOSIS — S81801D Unspecified open wound, right lower leg, subsequent encounter: Secondary | ICD-10-CM | POA: Diagnosis not present

## 2018-12-06 DIAGNOSIS — Z978 Presence of other specified devices: Secondary | ICD-10-CM | POA: Diagnosis not present

## 2018-12-06 DIAGNOSIS — T847XXA Infection and inflammatory reaction due to other internal orthopedic prosthetic devices, implants and grafts, initial encounter: Secondary | ICD-10-CM | POA: Diagnosis not present

## 2018-12-06 DIAGNOSIS — X58XXXA Exposure to other specified factors, initial encounter: Secondary | ICD-10-CM | POA: Diagnosis not present

## 2018-12-06 DIAGNOSIS — S81801A Unspecified open wound, right lower leg, initial encounter: Secondary | ICD-10-CM | POA: Diagnosis present

## 2018-12-06 DIAGNOSIS — S82251A Displaced comminuted fracture of shaft of right tibia, initial encounter for closed fracture: Secondary | ICD-10-CM | POA: Diagnosis not present

## 2018-12-06 DIAGNOSIS — M869 Osteomyelitis, unspecified: Secondary | ICD-10-CM | POA: Diagnosis present

## 2018-12-06 DIAGNOSIS — T8132XA Disruption of internal operation (surgical) wound, not elsewhere classified, initial encounter: Secondary | ICD-10-CM | POA: Diagnosis present

## 2018-12-06 DIAGNOSIS — R7303 Prediabetes: Secondary | ICD-10-CM | POA: Diagnosis present

## 2018-12-06 DIAGNOSIS — T8459XA Infection and inflammatory reaction due to other internal joint prosthesis, initial encounter: Principal | ICD-10-CM | POA: Diagnosis present

## 2018-12-06 DIAGNOSIS — Z8673 Personal history of transient ischemic attack (TIA), and cerebral infarction without residual deficits: Secondary | ICD-10-CM

## 2018-12-06 DIAGNOSIS — I10 Essential (primary) hypertension: Secondary | ICD-10-CM | POA: Diagnosis present

## 2018-12-06 DIAGNOSIS — F4310 Post-traumatic stress disorder, unspecified: Secondary | ICD-10-CM | POA: Diagnosis present

## 2018-12-06 DIAGNOSIS — Z87891 Personal history of nicotine dependence: Secondary | ICD-10-CM

## 2018-12-06 DIAGNOSIS — B9689 Other specified bacterial agents as the cause of diseases classified elsewhere: Secondary | ICD-10-CM | POA: Diagnosis present

## 2018-12-06 DIAGNOSIS — E7800 Pure hypercholesterolemia, unspecified: Secondary | ICD-10-CM | POA: Diagnosis present

## 2018-12-06 DIAGNOSIS — Z95828 Presence of other vascular implants and grafts: Secondary | ICD-10-CM | POA: Diagnosis not present

## 2018-12-06 DIAGNOSIS — T84622A Infection and inflammatory reaction due to internal fixation device of right tibia, initial encounter: Secondary | ICD-10-CM | POA: Diagnosis not present

## 2018-12-06 DIAGNOSIS — Z419 Encounter for procedure for purposes other than remedying health state, unspecified: Secondary | ICD-10-CM

## 2018-12-06 DIAGNOSIS — Z8781 Personal history of (healed) traumatic fracture: Secondary | ICD-10-CM | POA: Diagnosis not present

## 2018-12-06 DIAGNOSIS — B957 Other staphylococcus as the cause of diseases classified elsewhere: Secondary | ICD-10-CM | POA: Diagnosis not present

## 2018-12-06 DIAGNOSIS — F419 Anxiety disorder, unspecified: Secondary | ICD-10-CM | POA: Diagnosis not present

## 2018-12-06 DIAGNOSIS — T847XXD Infection and inflammatory reaction due to other internal orthopedic prosthetic devices, implants and grafts, subsequent encounter: Secondary | ICD-10-CM | POA: Diagnosis not present

## 2018-12-06 DIAGNOSIS — B1920 Unspecified viral hepatitis C without hepatic coma: Secondary | ICD-10-CM | POA: Diagnosis present

## 2018-12-06 DIAGNOSIS — Z8619 Personal history of other infectious and parasitic diseases: Secondary | ICD-10-CM | POA: Diagnosis not present

## 2018-12-06 HISTORY — DX: Acute kidney failure, unspecified: N17.9

## 2018-12-06 HISTORY — DX: Other complications of anesthesia, initial encounter: T88.59XA

## 2018-12-06 HISTORY — PX: HARDWARE REMOVAL: SHX979

## 2018-12-06 HISTORY — DX: Adverse effect of unspecified anesthetic, initial encounter: T41.45XA

## 2018-12-06 HISTORY — DX: Cerebral infarction, unspecified: I63.9

## 2018-12-06 HISTORY — DX: Other amnesia: R41.3

## 2018-12-06 HISTORY — DX: Prediabetes: R73.03

## 2018-12-06 HISTORY — PX: I&D EXTREMITY: SHX5045

## 2018-12-06 LAB — CBC
HCT: 41.3 % (ref 39.0–52.0)
Hemoglobin: 13.2 g/dL (ref 13.0–17.0)
MCH: 29.2 pg (ref 26.0–34.0)
MCHC: 32 g/dL (ref 30.0–36.0)
MCV: 91.4 fL (ref 80.0–100.0)
Platelets: 219 10*3/uL (ref 150–400)
RBC: 4.52 MIL/uL (ref 4.22–5.81)
RDW: 13.7 % (ref 11.5–15.5)
WBC: 7.9 10*3/uL (ref 4.0–10.5)
nRBC: 0 % (ref 0.0–0.2)

## 2018-12-06 LAB — BASIC METABOLIC PANEL
Anion gap: 11 (ref 5–15)
BUN: 8 mg/dL (ref 8–23)
CO2: 25 mmol/L (ref 22–32)
Calcium: 9.2 mg/dL (ref 8.9–10.3)
Chloride: 102 mmol/L (ref 98–111)
Creatinine, Ser: 0.79 mg/dL (ref 0.61–1.24)
GFR calc Af Amer: >60 mL/min (ref >60)
GFR calc non Af Amer: >60 mL/min (ref >60)
Glucose, Bld: 111 mg/dL — ABNORMAL HIGH (ref 70–99)
Potassium: 3.4 mmol/L — ABNORMAL LOW (ref 3.5–5.1)
Sodium: 138 mmol/L (ref 135–145)

## 2018-12-06 LAB — GLUCOSE, CAPILLARY
Glucose-Capillary: 119 mg/dL — ABNORMAL HIGH (ref 70–99)
Glucose-Capillary: 128 mg/dL — ABNORMAL HIGH (ref 70–99)

## 2018-12-06 SURGERY — REMOVAL, HARDWARE
Anesthesia: General | Laterality: Right

## 2018-12-06 MED ORDER — ONDANSETRON HCL 4 MG/2ML IJ SOLN: 4.0000 mg | Freq: Four times a day (QID) | INTRAMUSCULAR | Status: DC | PRN

## 2018-12-06 MED ORDER — FENTANYL CITRATE (PF) 100 MCG/2ML IJ SOLN
25.0000 ug | INTRAMUSCULAR | Status: DC | PRN
  Administered 2018-12-06: 25 ug via INTRAVENOUS

## 2018-12-06 MED ORDER — FENTANYL CITRATE (PF) 100 MCG/2ML IJ SOLN
INTRAMUSCULAR | Status: AC
  Filled 2018-12-06: qty 2

## 2018-12-06 MED ORDER — VANCOMYCIN HCL 500 MG IV SOLR
INTRAVENOUS | Status: AC
  Filled 2018-12-06: qty 500

## 2018-12-06 MED ORDER — ASPIRIN EC 325 MG PO TBEC
325.0000 mg | DELAYED_RELEASE_TABLET | Freq: Every day | ORAL | Status: DC
  Administered 2018-12-06 – 2018-12-13 (×7): 325 mg via ORAL
  Filled 2018-12-06 (×7): qty 1

## 2018-12-06 MED ORDER — FENTANYL CITRATE (PF) 250 MCG/5ML IJ SOLN
INTRAMUSCULAR | Status: AC
  Filled 2018-12-06: qty 5

## 2018-12-06 MED ORDER — CEFAZOLIN SODIUM-DEXTROSE 2-4 GM/100ML-% IV SOLN
2.0000 g | INTRAVENOUS | Status: AC
  Administered 2018-12-06: 12:00:00 2 g via INTRAVENOUS

## 2018-12-06 MED ORDER — ONDANSETRON HCL 4 MG PO TABS: 4.0000 mg | ORAL_TABLET | Freq: Four times a day (QID) | ORAL | Status: DC | PRN

## 2018-12-06 MED ORDER — PANTOPRAZOLE SODIUM 40 MG PO TBEC
40.0000 mg | DELAYED_RELEASE_TABLET | Freq: Every day | ORAL | Status: DC
  Administered 2018-12-07 – 2018-12-13 (×6): 40 mg via ORAL
  Filled 2018-12-06 (×6): qty 1

## 2018-12-06 MED ORDER — GENTAMICIN SULFATE 40 MG/ML IJ SOLN
INTRAMUSCULAR | Status: DC | PRN
  Administered 2018-12-06 (×2): 80 mg via INTRAMUSCULAR

## 2018-12-06 MED ORDER — ACETAMINOPHEN 325 MG PO TABS
325.0000 mg | ORAL_TABLET | Freq: Four times a day (QID) | ORAL | Status: DC | PRN
  Administered 2018-12-11 (×2): 650 mg via ORAL
  Filled 2018-12-06 (×3): qty 2

## 2018-12-06 MED ORDER — DOCUSATE SODIUM 100 MG PO CAPS
100.0000 mg | ORAL_CAPSULE | Freq: Two times a day (BID) | ORAL | Status: DC
  Administered 2018-12-06 – 2018-12-13 (×10): 100 mg via ORAL
  Filled 2018-12-06 (×14): qty 1

## 2018-12-06 MED ORDER — VANCOMYCIN HCL 500 MG IV SOLR
INTRAVENOUS | Status: DC | PRN
  Administered 2018-12-06: 500 mg

## 2018-12-06 MED ORDER — PRAVASTATIN SODIUM 40 MG PO TABS
40.0000 mg | ORAL_TABLET | Freq: Every day | ORAL | Status: DC
  Administered 2018-12-06 – 2018-12-13 (×7): 40 mg via ORAL
  Filled 2018-12-06 (×7): qty 1

## 2018-12-06 MED ORDER — LIDOCAINE 2% (20 MG/ML) 5 ML SYRINGE
INTRAMUSCULAR | Status: DC | PRN
  Administered 2018-12-06: 40 mg via INTRAVENOUS

## 2018-12-06 MED ORDER — METOCLOPRAMIDE HCL 5 MG PO TABS: 5.0000 mg | ORAL_TABLET | Freq: Three times a day (TID) | ORAL | Status: DC | PRN

## 2018-12-06 MED ORDER — MIDAZOLAM HCL 2 MG/2ML IJ SOLN
INTRAMUSCULAR | Status: AC
  Filled 2018-12-06: qty 2

## 2018-12-06 MED ORDER — LACTATED RINGERS IV SOLN
INTRAVENOUS | Status: DC
  Administered 2018-12-06: 10:00:00 via INTRAVENOUS

## 2018-12-06 MED ORDER — GENTAMICIN SULFATE 40 MG/ML IJ SOLN
INTRAMUSCULAR | Status: AC
  Filled 2018-12-06: qty 4

## 2018-12-06 MED ORDER — VANCOMYCIN HCL 10 G IV SOLR
1750.0000 mg | Freq: Once | INTRAVENOUS | Status: AC
  Administered 2018-12-06: 1750 mg via INTRAVENOUS
  Filled 2018-12-06: qty 1750

## 2018-12-06 MED ORDER — VANCOMYCIN HCL 10 G IV SOLR
1500.0000 mg | Freq: Two times a day (BID) | INTRAVENOUS | Status: DC
  Administered 2018-12-07 – 2018-12-09 (×5): 1500 mg via INTRAVENOUS
  Filled 2018-12-06 (×7): qty 1500

## 2018-12-06 MED ORDER — HYDROMORPHONE HCL 1 MG/ML IJ SOLN
0.5000 mg | INTRAMUSCULAR | Status: DC | PRN
  Administered 2018-12-06 – 2018-12-11 (×2): 1 mg via INTRAVENOUS
  Filled 2018-12-06: qty 1

## 2018-12-06 MED ORDER — METOCLOPRAMIDE HCL 5 MG/ML IJ SOLN: 5.0000 mg | Freq: Three times a day (TID) | INTRAMUSCULAR | Status: DC | PRN

## 2018-12-06 MED ORDER — PROPOFOL 10 MG/ML IV BOLUS
INTRAVENOUS | Status: AC
  Filled 2018-12-06: qty 20

## 2018-12-06 MED ORDER — 0.9 % SODIUM CHLORIDE (POUR BTL) OPTIME
TOPICAL | Status: DC | PRN
  Administered 2018-12-06 (×2): 1000 mL

## 2018-12-06 MED ORDER — ALPRAZOLAM 0.5 MG PO TABS: 2.0000 mg | ORAL_TABLET | Freq: Two times a day (BID) | ORAL | Status: DC | PRN

## 2018-12-06 MED ORDER — OXYCODONE HCL 5 MG PO TABS
5.0000 mg | ORAL_TABLET | ORAL | Status: DC | PRN
  Filled 2018-12-06 (×2): qty 2

## 2018-12-06 MED ORDER — TEMAZEPAM 15 MG PO CAPS
30.0000 mg | ORAL_CAPSULE | Freq: Every day | ORAL | Status: DC
  Administered 2018-12-06 – 2018-12-12 (×7): 30 mg via ORAL
  Filled 2018-12-06 (×7): qty 2

## 2018-12-06 MED ORDER — CHLORHEXIDINE GLUCONATE 4 % EX LIQD: 60.0000 mL | Freq: Once | CUTANEOUS | Status: DC

## 2018-12-06 MED ORDER — VITAMIN C 500 MG PO TABS
500.0000 mg | ORAL_TABLET | Freq: Every day | ORAL | Status: DC
  Administered 2018-12-07 – 2018-12-13 (×6): 500 mg via ORAL
  Filled 2018-12-06 (×6): qty 1

## 2018-12-06 MED ORDER — OXYCODONE HCL 5 MG PO TABS
10.0000 mg | ORAL_TABLET | ORAL | Status: DC | PRN
  Administered 2018-12-07: 15 mg via ORAL
  Administered 2018-12-08: 5 mg via ORAL
  Administered 2018-12-11: 15 mg via ORAL
  Administered 2018-12-11: 21:00:00 10 mg via ORAL
  Administered 2018-12-11 – 2018-12-12 (×5): 15 mg via ORAL
  Filled 2018-12-06 (×7): qty 3

## 2018-12-06 MED ORDER — PROPOFOL 10 MG/ML IV BOLUS
INTRAVENOUS | Status: DC | PRN
  Administered 2018-12-06: 150 mg via INTRAVENOUS

## 2018-12-06 MED ORDER — VITAMIN D 25 MCG (1000 UNIT) PO TABS
1000.0000 [IU] | ORAL_TABLET | Freq: Every day | ORAL | Status: DC
  Administered 2018-12-07 – 2018-12-13 (×6): 1000 [IU] via ORAL
  Filled 2018-12-06 (×7): qty 1

## 2018-12-06 MED ORDER — MIDAZOLAM HCL 5 MG/5ML IJ SOLN
INTRAMUSCULAR | Status: DC | PRN
  Administered 2018-12-06: 2 mg via INTRAVENOUS

## 2018-12-06 MED ORDER — HYDROMORPHONE HCL 1 MG/ML IJ SOLN
INTRAMUSCULAR | Status: AC
  Filled 2018-12-06: qty 1

## 2018-12-06 MED ORDER — LACTATED RINGERS IV SOLN
INTRAVENOUS | Status: DC | PRN
  Administered 2018-12-06 (×2): via INTRAVENOUS

## 2018-12-06 MED ORDER — FENTANYL CITRATE (PF) 100 MCG/2ML IJ SOLN
INTRAMUSCULAR | Status: DC | PRN
  Administered 2018-12-06: 25 ug via INTRAVENOUS
  Administered 2018-12-06 (×4): 50 ug via INTRAVENOUS
  Administered 2018-12-06: 25 ug via INTRAVENOUS

## 2018-12-06 MED ORDER — ONDANSETRON HCL 4 MG/2ML IJ SOLN
INTRAMUSCULAR | Status: DC | PRN
  Administered 2018-12-06: 4 mg via INTRAVENOUS

## 2018-12-06 MED ORDER — CEFAZOLIN SODIUM-DEXTROSE 2-4 GM/100ML-% IV SOLN
INTRAVENOUS | Status: AC
  Filled 2018-12-06: qty 100

## 2018-12-06 MED ORDER — DEXAMETHASONE SODIUM PHOSPHATE 10 MG/ML IJ SOLN
INTRAMUSCULAR | Status: DC | PRN
  Administered 2018-12-06: 5 mg via INTRAVENOUS

## 2018-12-06 MED ORDER — ACETAMINOPHEN 500 MG PO TABS
1000.0000 mg | ORAL_TABLET | Freq: Four times a day (QID) | ORAL | Status: AC
  Administered 2018-12-06 – 2018-12-07 (×4): 1000 mg via ORAL
  Filled 2018-12-06 (×4): qty 2

## 2018-12-06 SURGICAL SUPPLY — 75 items
BANDAGE ESMARK 6X9 LF (GAUZE/BANDAGES/DRESSINGS) IMPLANT
BAR EXFX 350X11 NS LF (EXFIX) ×1
BAR EXFX 400X11 NS LF (EXFIX) ×1
BAR GLASS FIBER EXFX 11X350 (EXFIX) ×2 IMPLANT
BAR GLASS FIBER EXFX 11X400 (EXFIX) ×2 IMPLANT
BIT DRILL CANN MED FLUTE 4.0 (BIT) IMPLANT
BLADE SURG 21 STRL SS (BLADE) ×3 IMPLANT
BNDG CMPR 9X6 STRL LF SNTH (GAUZE/BANDAGES/DRESSINGS)
BNDG COHESIVE 4X5 TAN STRL (GAUZE/BANDAGES/DRESSINGS) IMPLANT
BNDG COHESIVE 6X5 TAN STRL LF (GAUZE/BANDAGES/DRESSINGS) IMPLANT
BNDG ESMARK 6X9 LF (GAUZE/BANDAGES/DRESSINGS)
BNDG GAUZE ELAST 4 BULKY (GAUZE/BANDAGES/DRESSINGS) ×8 IMPLANT
CANISTER WOUNDNEG PRESSURE 500 (CANNISTER) ×2 IMPLANT
CLAMP BLUE BAR TO PIN (EXFIX) ×4 IMPLANT
COVER SURGICAL LIGHT HANDLE (MISCELLANEOUS) ×6 IMPLANT
COVER WAND RF STERILE (DRAPES) ×3 IMPLANT
CUFF TOURNIQUET SINGLE 34IN LL (TOURNIQUET CUFF) IMPLANT
CUFF TOURNIQUET SINGLE 44IN (TOURNIQUET CUFF) IMPLANT
DRAPE C-ARM 42X72 X-RAY (DRAPES) ×2 IMPLANT
DRAPE C-ARMOR (DRAPES) ×2 IMPLANT
DRAPE INCISE IOBAN 66X45 STRL (DRAPES) IMPLANT
DRAPE ORTHO SPLIT 77X108 STRL (DRAPES)
DRAPE SURG ORHT 6 SPLT 77X108 (DRAPES) IMPLANT
DRAPE U-SHAPE 47X51 STRL (DRAPES) ×3 IMPLANT
DRESSING VERAFLO CLEANSE CC (GAUZE/BANDAGES/DRESSINGS) IMPLANT
DRILL CANN 4.0MM (BIT) ×3
DRSG ADAPTIC 3X8 NADH LF (GAUZE/BANDAGES/DRESSINGS) ×3 IMPLANT
DRSG EMULSION OIL 3X3 NADH (GAUZE/BANDAGES/DRESSINGS) ×3 IMPLANT
DRSG PAD ABDOMINAL 8X10 ST (GAUZE/BANDAGES/DRESSINGS) ×3 IMPLANT
DRSG VERAFLO CLEANSE CC (GAUZE/BANDAGES/DRESSINGS) ×3
DURAPREP 26ML APPLICATOR (WOUND CARE) ×3 IMPLANT
ELECT REM PT RETURN 9FT ADLT (ELECTROSURGICAL) ×3
ELECTRODE REM PT RTRN 9FT ADLT (ELECTROSURGICAL) ×1 IMPLANT
GAUZE SPONGE 4X4 12PLY STRL (GAUZE/BANDAGES/DRESSINGS) ×3 IMPLANT
GLOVE BIOGEL PI IND STRL 6.5 (GLOVE) IMPLANT
GLOVE BIOGEL PI IND STRL 9 (GLOVE) ×1 IMPLANT
GLOVE BIOGEL PI INDICATOR 6.5 (GLOVE) ×2
GLOVE BIOGEL PI INDICATOR 9 (GLOVE) ×2
GLOVE SURG ORTHO 9.0 STRL STRW (GLOVE) ×3 IMPLANT
GLOVE SURG SS PI 6.5 STRL IVOR (GLOVE) ×2 IMPLANT
GOWN STRL REUS W/ TWL XL LVL3 (GOWN DISPOSABLE) ×3 IMPLANT
GOWN STRL REUS W/TWL XL LVL3 (GOWN DISPOSABLE) ×9
HALF PIN 5.0X160 (EXFIX) ×8 IMPLANT
HANDPIECE INTERPULSE COAX TIP (DISPOSABLE)
KIT BASIN OR (CUSTOM PROCEDURE TRAY) ×3 IMPLANT
KIT STIMULAN RAPID CURE 5CC (Orthopedic Implant) ×2 IMPLANT
KIT TURNOVER KIT B (KITS) ×3 IMPLANT
MANIFOLD NEPTUNE II (INSTRUMENTS) ×3 IMPLANT
MATRIX WOUND 3-LAYER 7X10 (Tissue) ×2 IMPLANT
NS IRRIG 1000ML POUR BTL (IV SOLUTION) ×3 IMPLANT
PACK ORTHO EXTREMITY (CUSTOM PROCEDURE TRAY) ×3 IMPLANT
PAD ARMBOARD 7.5X6 YLW CONV (MISCELLANEOUS) ×6 IMPLANT
PAD NEG PRESSURE SENSATRAC (MISCELLANEOUS) ×2 IMPLANT
PIN CLAMP 2BAR 75MM BLUE (EXFIX) ×2 IMPLANT
PIN TRANSFIXING 5.0 (EXFIX) ×2 IMPLANT
SET HNDPC FAN SPRY TIP SCT (DISPOSABLE) IMPLANT
SPONGE LAP 18X18 RF (DISPOSABLE) ×4 IMPLANT
STAPLER VISISTAT 35W (STAPLE) IMPLANT
STOCKINETTE IMPERVIOUS 9X36 MD (GAUZE/BANDAGES/DRESSINGS) IMPLANT
SUCTION FRAZIER TIP 10 FR DISP (SUCTIONS) ×2 IMPLANT
SUT ETHILON 2 0 PSLX (SUTURE) IMPLANT
SUT VIC AB 0 CT1 27 (SUTURE)
SUT VIC AB 0 CT1 27XBRD ANBCTR (SUTURE) IMPLANT
SUT VIC AB 2-0 CT1 27 (SUTURE)
SUT VIC AB 2-0 CT1 TAPERPNT 27 (SUTURE) IMPLANT
SWAB COLLECTION DEVICE MRSA (MISCELLANEOUS) ×3 IMPLANT
SWAB CULTURE ESWAB REG 1ML (MISCELLANEOUS) IMPLANT
TOWEL OR 17X24 6PK STRL BLUE (TOWEL DISPOSABLE) ×3 IMPLANT
TOWEL OR 17X26 10 PK STRL BLUE (TOWEL DISPOSABLE) ×3 IMPLANT
TUBE CONNECTING 12'X1/4 (SUCTIONS) ×1
TUBE CONNECTING 12X1/4 (SUCTIONS) ×2 IMPLANT
UNDERPAD 30X30 (UNDERPADS AND DIAPERS) ×3 IMPLANT
WATER STERILE IRR 1000ML POUR (IV SOLUTION) ×3 IMPLANT
WOUND MATRIX 3-LAYER 7X10 (Tissue) ×2 IMPLANT
YANKAUER SUCT BULB TIP NO VENT (SUCTIONS) ×3 IMPLANT

## 2018-12-06 NOTE — Op Note (Signed)
12/06/2018  1:09 PM  PATIENT:  Kyle Mcconnell    PRE-OPERATIVE DIAGNOSIS:  Open Wound Right Leg with Exposed Hardware  POST-OPERATIVE DIAGNOSIS:  Same  PROCEDURE:  REMOVAL DEEP HARDWARE RIGHT LEG, by Dr. Jena Gauss WOUND DEBRIDEMENT RIGHT LEG, with excision skin and soft tissue tendon and tibia. Application of external fixator.  By Dr. Roslyn Smiling tissue rearrangement for wound closure 11 x 5 cm. Application of antibiotic beads, stimulant, 500 mg vancomycin and 160 mg gentamicin. Application of ACell powder and ACell sheet.  C arm fluoroscopy  SURGEON:  Nadara Mustard, MD  PHYSICIAN ASSISTANT:None ANESTHESIA:   General  PREOPERATIVE INDICATIONS:  Kyle Mcconnell is a  63 y.o. male with a diagnosis of Open Wound Right Leg with Exposed Hardware who failed conservative measures and elected for surgical management.    The risks benefits and alternatives were discussed with the patient preoperatively including but not limited to the risks of infection, bleeding, nerve injury, cardiopulmonary complications, the need for revision surgery, among others, and the patient was willing to proceed.  OPERATIVE IMPLANTS: ACell powder 1 g and ACell sheet x1.  Antibiotic beads stimulant with 500 mg vancomycin and 160 mg gentamicin.  Tissue cultures sent.  @ENCIMAGES @  OPERATIVE FINDINGS: Nonviable sclerotic tibia was excised.  Soft nonviable anterior tibial tendon partially excised.  No purulent abscess.  OPERATIVE PROCEDURE: Patient was brought the operating room and underwent a general anesthetic.  After adequate levels anesthesia were obtained patient's right lower extremity was first prepped using Betadine paint dried and then prepped using DuraPrep draped into a sterile field a timeout was called.  A longitudinal elliptical incision was made around the ulcer anteriorly of the ankle this incision extended 11 x 5 cm.  This was carried down to the plate the plate was removed by Dr. Jena Gauss.  The  wound was irrigated with normal saline.  Skin and soft tissue muscle and bone were sharply excised.  An osteotome was used to further excise part of the tibia to get it back to bleeding bone.  There was one large piece of dead bone from the anterior aspect of the tibia and this was partially debrided and excised.  The medial border of the anterior tibial tendon was excised approximately 30% of the tendon.  The wound was again irrigated.  The medial malleolar screw was also removed as well as the anterior plate.  The wound was further irrigated and antibiotic beads were packed within the metaphysis of the distal tibia.  Local tissue rearrangement was performed for partial wound closure of the 11 x 5 cm wound.  The wound was then packed with ACell powder and a cell sheet was used to cover the tendon and a cleanse choice wound VAC was applied this had a good suction fit.  Dr. Dan Maker supplied a external fixator delta frame and alignment was checked under C arm.  Patient was extubated taken to PACU in stable condition.   DISCHARGE PLANNING:  Antibiotic duration: Continue antibiotics for at least a week pending tissue cultures  Weightbearing: Nonweightbearing on the right  Pain medication: Opioid pathway  Dressing care/ Wound VAC: Continue wound VAC until return to the operating room on Wednesday  Ambulatory devices: Walker or crutches  Discharge to: Inpatient with follow-up surgery on Wednesday.  Follow-up: In the office 1 week post operative.

## 2018-12-06 NOTE — Anesthesia Procedure Notes (Signed)
Procedure Name: LMA Insertion Date/Time: 12/06/2018 11:57 AM Performed by: Carmela Rima, CRNA Pre-anesthesia Checklist: Timeout performed, Patient being monitored, Suction available, Emergency Drugs available and Patient identified Patient Re-evaluated:Patient Re-evaluated prior to induction Oxygen Delivery Method: Circle system utilized Preoxygenation: Pre-oxygenation with 100% oxygen Induction Type: IV induction LMA: LMA inserted LMA Size: 5.0 Number of attempts: 1 Placement Confirmation: breath sounds checked- equal and bilateral,  positive ETCO2 and ETT inserted through vocal cords under direct vision Tube secured with: Tape Dental Injury: Teeth and Oropharynx as per pre-operative assessment

## 2018-12-06 NOTE — Telephone Encounter (Signed)
That is unfortunate. Thank you. 
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
## 2018-12-06 NOTE — Anesthesia Postprocedure Evaluation (Signed)
Anesthesia Post Note  Patient: Kyle Mcconnell  Procedure(s) Performed: REMOVAL DEEP HARDWARE RIGHT LEG APPLICATION EXTERNAL FIXATOR (Right ) WOUND DEBRIDEMENT RIGHT LEG (Right )     Patient location during evaluation: PACU Anesthesia Type: General Level of consciousness: awake and alert Pain management: pain level controlled Vital Signs Assessment: post-procedure vital signs reviewed and stable Respiratory status: spontaneous breathing, nonlabored ventilation, respiratory function stable and patient connected to nasal cannula oxygen Cardiovascular status: blood pressure returned to baseline and stable Postop Assessment: no apparent nausea or vomiting Anesthetic complications: no    Last Vitals:  Vitals:   12/06/18 0933 12/06/18 1325  BP: (!) 142/87 115/67  Pulse: 100 85  Resp: 18 (!) 21  Temp: 36.9 C   SpO2: 97% 100%    Last Pain:  Vitals:   12/06/18 1325  TempSrc:   PainSc: (P) 0-No pain                 Cattie Tineo COKER

## 2018-12-06 NOTE — Telephone Encounter (Signed)
Patient going into hospital for surgery with Dr. Hulan Amato to let you know that he wouldn't be able to make his appointment next Wednesday.

## 2018-12-06 NOTE — Progress Notes (Signed)
Pharmacy Antibiotic Note  Kyle Mcconnell is a 63 y.o. male admitted on 12/06/2018 with RLE osteomyelitis. S/p removal of exposed hardware from open wound R leg on 4/24 with debridement and wound vac placement. Pharmacy has been consulted for vancomycin dosing. Patient received cefazolin pre-op.  Plan: Vancomycin 1750mg  IV x 1; then Vancomycin 1500 mg IV Q 12 hrs. Goal AUC 400-550. Expected AUC: 532 SCr used: 0.8 Monitor clinical progress, c/s, renal function F/u de-escalation plan/LOT, vancomycin levels as indicated   Height: 6' 1.5" (186.7 cm) Weight: 185 lb 3.1 oz (84 kg) IBW/kg (Calculated) : 81.05  Temp (24hrs), Avg:97.7 F (36.5 C), Min:96.8 F (36 C), Max:98.5 F (36.9 C)  Recent Labs  Lab 12/06/18 1005  WBC 7.9  CREATININE 0.79    Estimated Creatinine Clearance: 107 mL/min (by C-G formula based on SCr of 0.79 mg/dL).    No Known Allergies   Babs Bertin, PharmD, BCPS Please check AMION for all [facility] contact numbers Clinical Pharmacist 12/06/2018 3:50 PM

## 2018-12-06 NOTE — Transfer of Care (Signed)
Immediate Anesthesia Transfer of Care Note  Patient: Kyle Mcconnell  Procedure(s) Performed: REMOVAL DEEP HARDWARE RIGHT LEG APPLICATION EXTERNAL FIXATOR (Right ) WOUND DEBRIDEMENT RIGHT LEG (Right )  Patient Location: PACU  Anesthesia Type:General  Level of Consciousness: awake, alert  and oriented  Airway & Oxygen Therapy: Patient Spontanous Breathing and Patient connected to face mask oxygen  Post-op Assessment: Report given to RN, Post -op Vital signs reviewed and stable and Patient moving all extremities X 4  Post vital signs: Reviewed and stable  Last Vitals:  Vitals Value Taken Time  BP    Temp    Pulse    Resp    SpO2      Last Pain:  Vitals:   12/06/18 1000  TempSrc:   PainSc: 0-No pain         Complications: No apparent anesthesia complications

## 2018-12-07 LAB — BASIC METABOLIC PANEL
Anion gap: 10 (ref 5–15)
BUN: 11 mg/dL (ref 8–23)
CO2: 25 mmol/L (ref 22–32)
Calcium: 9.4 mg/dL (ref 8.9–10.3)
Chloride: 102 mmol/L (ref 98–111)
Creatinine, Ser: 0.76 mg/dL (ref 0.61–1.24)
GFR calc Af Amer: >60 mL/min (ref >60)
GFR calc non Af Amer: >60 mL/min (ref >60)
Glucose, Bld: 137 mg/dL — ABNORMAL HIGH (ref 70–99)
Potassium: 4 mmol/L (ref 3.5–5.1)
Sodium: 137 mmol/L (ref 135–145)

## 2018-12-07 MED ORDER — PIPERACILLIN-TAZOBACTAM 3.375 G IVPB
3.3750 g | Freq: Three times a day (TID) | INTRAVENOUS | Status: DC
  Administered 2018-12-07 – 2018-12-08 (×2): 3.375 g via INTRAVENOUS
  Filled 2018-12-07 (×2): qty 50

## 2018-12-07 NOTE — Progress Notes (Signed)
Pharmacy Antibiotic Note  Kyle Mcconnell is a 63 y.o. male admitted on 12/06/2018 with RLE osteomyelitis. S/p removal of exposed hardware from open wound R leg on 4/24 with debridement and wound vac placement. Pharmacy has been consulted for vancomycin dosing. Patient received cefazolin pre-op.  Culture is now growing GNRs. Called and spoke with On-call PA Mardella Layman) - adding Zosyn for gram negative coverage per pharmacy consult.  -SCr is stable. WBC is within normal limits and patient is afebrile.   Plan: Add Zosyn 3.375g IV every 8 hours - extended infusion. May be able to stop Vancomycin - follow-up culture For now, continue Vancomycin 1500 mg IV Q 12 hrs. Goal AUC 400-550. Expected AUC: 532 SCr used: 0.8 Monitor clinical progress, c/s, renal function F/u de-escalation plan/LOT, vancomycin levels as indicated   Height: 6' 1.5" (186.7 cm) Weight: 185 lb 3.1 oz (84 kg) IBW/kg (Calculated) : 81.05  Temp (24hrs), Avg:97.7 F (36.5 C), Min:97.2 F (36.2 C), Max:98.4 F (36.9 C)  Recent Labs  Lab 12/06/18 1005 12/07/18 0239  WBC 7.9  --   CREATININE 0.79 0.76    Estimated Creatinine Clearance: 107 mL/min (by C-G formula based on SCr of 0.76 mg/dL).    No Known Allergies   Link Snuffer, PharmD, BCPS, BCCCP Clinical Pharmacist Please refer to [facility] for [facility] Pharmacy numbers 12/07/2018 2:26 PM
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
## 2018-12-07 NOTE — Plan of Care (Signed)
  Problem: Pain Managment: Goal: General experience of comfort will improve Outcome: Progressing   

## 2018-12-07 NOTE — Progress Notes (Signed)
Patient ID: Kyle Mcconnell, male   DOB: 03-27-1956, 63 y.o.   MRN: [id number] Patient is postoperative day 1 removal of deep retained hardware removal of infected bone and soft tissue was sent for cultures external fixator applied antibiotic beads placed with ACell and a wound VAC.  Tissue cultures are showing gram-positive cocci and gram-negative rods . Wound VAC has 50 cc.  2 checks on the seal check,  will change antibiotics according to the cultures.  Plan to return the operating room on Wednesday and possibly Friday.

## 2018-12-07 NOTE — Progress Notes (Signed)
Rehab Admissions Coordinator Note:  Per OT recommendation, this patient was screened by Nanine Means for appropriateness for an Inpatient Acute Rehab Consult. Per OT note, pt does not wish to return to CIR, though it does appear functionally he would benefit from CIR. AC will follow along for appropriateness prior to requesting an IP Rehab consult as it appears pt is to return to the OR for additional surgeries planned for next week.   Will follow along.   Nanine Means 12/07/2018, 11:35 AM  I can be reached at [phone].

## 2018-12-07 NOTE — Plan of Care (Signed)
  Problem: Clinical Measurements: Goal: Will remain free from infection Outcome: Progressing   Problem: Nutrition: Goal: Adequate nutrition will be maintained Outcome: Progressing   

## 2018-12-08 MED ORDER — SODIUM CHLORIDE 0.9 % IV SOLN
2.0000 g | Freq: Three times a day (TID) | INTRAVENOUS | Status: DC
  Administered 2018-12-08 – 2018-12-13 (×15): 2 g via INTRAVENOUS
  Filled 2018-12-08 (×20): qty 2
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
## 2018-12-09 ENCOUNTER — Encounter (HOSPITAL_COMMUNITY): Payer: Self-pay | Admitting: Orthopedic Surgery

## 2018-12-09 ENCOUNTER — Ambulatory Visit (INDEPENDENT_AMBULATORY_CARE_PROVIDER_SITE_OTHER): Payer: Self-pay | Admitting: Physician Assistant

## 2018-12-09 DIAGNOSIS — Z8673 Personal history of transient ischemic attack (TIA), and cerebral infarction without residual deficits: Secondary | ICD-10-CM

## 2018-12-09 DIAGNOSIS — Z87891 Personal history of nicotine dependence: Secondary | ICD-10-CM

## 2018-12-09 DIAGNOSIS — R7303 Prediabetes: Secondary | ICD-10-CM

## 2018-12-09 DIAGNOSIS — B9689 Other specified bacterial agents as the cause of diseases classified elsewhere: Secondary | ICD-10-CM

## 2018-12-09 DIAGNOSIS — I10 Essential (primary) hypertension: Secondary | ICD-10-CM

## 2018-12-09 DIAGNOSIS — F4310 Post-traumatic stress disorder, unspecified: Secondary | ICD-10-CM

## 2018-12-09 DIAGNOSIS — Z8619 Personal history of other infectious and parasitic diseases: Secondary | ICD-10-CM

## 2018-12-09 DIAGNOSIS — Z978 Presence of other specified devices: Secondary | ICD-10-CM

## 2018-12-09 DIAGNOSIS — M869 Osteomyelitis, unspecified: Secondary | ICD-10-CM

## 2018-12-09 DIAGNOSIS — Z8781 Personal history of (healed) traumatic fracture: Secondary | ICD-10-CM

## 2018-12-09 MED ORDER — BACID PO TABS
2.0000 | ORAL_TABLET | Freq: Three times a day (TID) | ORAL | Status: DC
  Administered 2018-12-09 – 2018-12-13 (×9): 2 via ORAL
  Filled 2018-12-09 (×19): qty 2

## 2018-12-09 MED ORDER — ARTIFICIAL TEARS OPHTHALMIC OINT
TOPICAL_OINTMENT | Freq: Three times a day (TID) | OPHTHALMIC | Status: DC
  Filled 2018-12-09: qty 3.5

## 2018-12-09 MED ORDER — ADULT MULTIVITAMIN W/MINERALS CH
1.0000 | ORAL_TABLET | Freq: Every day | ORAL | Status: DC
  Administered 2018-12-09 – 2018-12-13 (×4): 1 via ORAL
  Filled 2018-12-09 (×4): qty 1

## 2018-12-09 MED ORDER — LISINOPRIL 10 MG PO TABS
10.0000 mg | ORAL_TABLET | Freq: Every day | ORAL | Status: DC
  Administered 2018-12-09 – 2018-12-13 (×4): 10 mg via ORAL
  Filled 2018-12-09 (×4): qty 1

## 2018-12-09 MED ORDER — ENSURE SURGERY PO LIQD
237.0000 mL | Freq: Three times a day (TID) | ORAL | Status: DC
  Administered 2018-12-10 – 2018-12-12 (×5): 237 mL via ORAL
  Filled 2018-12-09 (×13): qty 237

## 2018-12-09 MED ORDER — HYPROMELLOSE (GONIOSCOPIC) 2.5 % OP SOLN
1.0000 [drp] | Freq: Three times a day (TID) | OPHTHALMIC | Status: DC
  Filled 2018-12-09: qty 15

## 2018-12-09 MED ORDER — POLYVINYL ALCOHOL 1.4 % OP SOLN
1.0000 [drp] | Freq: Three times a day (TID) | OPHTHALMIC | Status: DC
  Administered 2018-12-09 – 2018-12-12 (×8): 1 [drp] via OPHTHALMIC
  Filled 2018-12-09: qty 15
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
## 2018-12-09 NOTE — Progress Notes (Signed)
Patient ID: Kyle Mcconnell, male   DOB: 14-Sep-1955, 63 y.o.   MRN: [id number] Nursing called patient requesting his home artificial tears, theratears. This is not on formulary but isopto tears is and this is ordered one drop each eye tid.
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
## 2018-12-09 NOTE — Plan of Care (Signed)

## 2018-12-09 NOTE — Progress Notes (Signed)
Patient ID: Kyle Mcconnell, male   DOB: Jun 12, 1956, 63 y.o.   MRN: [id number] Patient is status post debridement open wound right leg cultures are positive for Enterobacter and patient was switched to Maxipime yesterday.  Plan for repeat surgical debridement and possible skin graft on Wednesday.  Patient does have antibiotic beads in place with vancomycin and gentamicin.  With current cultures patient most likely will need discharge with long-term  antibiotics will consult infectious disease.  There is 100 cc in the wound VAC canister 2 checks.  Will reorder his lisinopril and will order probiotics.

## 2018-12-09 NOTE — Plan of Care (Signed)
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
## 2018-12-09 NOTE — H&P (View-Only) (Signed)
Patient ID: Kyle Mcconnell, male   DOB: 08/01/1956, 62 y.o.   MRN: [id number] Patient is status post debridement open wound right leg cultures are positive for Enterobacter and patient was switched to Maxipime yesterday.  Plan for repeat surgical debridement and possible skin graft on Wednesday.  Patient does have antibiotic beads in place with vancomycin and gentamicin.  With current cultures patient most likely will need discharge with long-term  antibiotics will consult infectious disease.  There is 100 cc in the wound VAC canister 2 checks.  Will reorder his lisinopril and will order probiotics. 

## 2018-12-10 ENCOUNTER — Inpatient Hospital Stay: Payer: Self-pay

## 2018-12-10 DIAGNOSIS — T84622A Infection and inflammatory reaction due to internal fixation device of right tibia, initial encounter: Secondary | ICD-10-CM

## 2018-12-10 DIAGNOSIS — M869 Osteomyelitis, unspecified: Secondary | ICD-10-CM

## 2018-12-10 DIAGNOSIS — S82251A Displaced comminuted fracture of shaft of right tibia, initial encounter for closed fracture: Secondary | ICD-10-CM

## 2018-12-10 DIAGNOSIS — B957 Other staphylococcus as the cause of diseases classified elsewhere: Secondary | ICD-10-CM

## 2018-12-10 DIAGNOSIS — R7303 Prediabetes: Secondary | ICD-10-CM

## 2018-12-10 DIAGNOSIS — I10 Essential (primary) hypertension: Secondary | ICD-10-CM

## 2018-12-10 DIAGNOSIS — B9689 Other specified bacterial agents as the cause of diseases classified elsewhere: Secondary | ICD-10-CM

## 2018-12-10 DIAGNOSIS — X58XXXA Exposure to other specified factors, initial encounter: Secondary | ICD-10-CM

## 2018-12-10 DIAGNOSIS — Z978 Presence of other specified devices: Secondary | ICD-10-CM

## 2018-12-10 DIAGNOSIS — F4310 Post-traumatic stress disorder, unspecified: Secondary | ICD-10-CM

## 2018-12-10 NOTE — Anesthesia Preprocedure Evaluation (Addendum)
Anesthesia Evaluation  Patient identified by MRN, date of birth, ID band Patient awake    Reviewed: Allergy & Precautions, NPO status , Patient's Chart, lab work & pertinent test results  Airway Mallampati: II  TM Distance: >3 FB     Dental   Pulmonary pneumonia, former smoker,    breath sounds clear to auscultation       Cardiovascular hypertension,  Rhythm:Regular Rate:Normal     Neuro/Psych    GI/Hepatic GERD  ,(+) Hepatitis -  Endo/Other    Renal/GU Renal disease     Musculoskeletal   Abdominal   Peds  Hematology   Anesthesia Other Findings   Reproductive/Obstetrics                           Anesthesia Physical Anesthesia Plan  ASA: III  Anesthesia Plan: General   Post-op Pain Management:    Induction: Intravenous  PONV Risk Score and Plan: Ondansetron, Dexamethasone and Midazolam  Airway Management Planned: Oral ETT  Additional Equipment:   Intra-op Plan:   Post-operative Plan: Possible Post-op intubation/ventilation  Informed Consent: I have reviewed the patients History and Physical, chart, labs and discussed the procedure including the risks, benefits and alternatives for the proposed anesthesia with the patient or authorized representative who has indicated his/her understanding and acceptance.     Dental advisory given  Plan Discussed with: Anesthesiologist and CRNA  Anesthesia Plan Comments:        Anesthesia Quick Evaluation

## 2018-12-10 NOTE — Progress Notes (Addendum)
Subjective: 4 Days Post-Op Procedure(s) (LRB): REMOVAL DEEP HARDWARE RIGHT LEG APPLICATION EXTERNAL FIXATOR (Right) WOUND DEBRIDEMENT RIGHT LEG (Right) Patient reports pain as mild.   Concerned about how long he is going to need the Ex Fix on the right ankle.  Objective: Vital signs in last 24 hours: Temp:  [97.9 F (36.6 C)-98.4 F (36.9 C)] 98.4 F (36.9 C) (04/28 0528) Pulse Rate:  [88-99] 90 (04/28 0528) Resp:  [18-19] 18 (04/28 0528) BP: (155-172)/(88-98) 155/88 (04/28 0528) SpO2:  [99 %-100 %] 100 % (04/28 0528)  Intake/Output from previous day: 04/27 0701 - 04/28 0700 In: 960 [P.O.:960] Out: 2500 [Urine:2450; Drains:50] Intake/Output this shift: Total I/O In: 225 [P.O.:225] Out: -   No results for input(s): HGB in the last 72 hours. No results for input(s): WBC, RBC, HCT, PLT in the last 72 hours. No results for input(s): NA, K, CL, CO2, BUN, CREATININE, GLUCOSE, CALCIUM in the last 72 hours. No results for input(s): LABPT, INR in the last 72 hours.  VAC dressing in place/Ex Fix in place over right ankle- VAC canister with ~ 125 cc in canister.    Assessment/Plan: 4 Days Post-Op Procedure(s) (LRB): REMOVAL DEEP HARDWARE RIGHT LEG APPLICATION EXTERNAL FIXATOR (Right) WOUND DEBRIDEMENT RIGHT LEG (Right) Plan back to OR tomorrow for another wash out of the right ankle. Operative cultures with Enterbacter and ID following - Plan cefepime and PICC line.  May need SNF at DC per Dr. Leroy Kennedy, PA-C 12/10/2018, 8:14 AM  [facility] [phone]

## 2018-12-10 NOTE — TOC Progression Note (Signed)
Transition of Care [facility]) - Progression Note    Patient Details  Name: Kyle Mcconnell MRN: [id number] Date of Birth: 1956-08-12  [facility]) CM/SW Contact  [name] [name], RN Phone Number: 12/10/2018, 3:09 PM  Clinical Narrative:     Plan patient will discharge with IV antibiotics. Discussed with patient. Patient still wanting to discharge to home with [facility]. Patient aware he and his daughters will be taught how to administer antibiotic because home health RN will not be present every time dose is due   Expected Discharge Plan: Home w Home Health Services Barriers to Discharge: Continued Medical Work up  Expected Discharge Plan and Services Expected Discharge Plan: Home w Home Health Services   Discharge Planning Services: CM Consult   Living arrangements for the past 2 months: Single Family Home                 DME Arranged: N/A DME Agency: NA         HH Agency: [facility] Date HH Agency Contacted: 12/09/18 Time HH Agency Contacted: 1236 Representative spoke with at [name]: [name]   Social Determinants of Health (SDOH) Interventions    Readmission Risk Interventions No flowsheet data found.
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
## 2018-12-10 NOTE — Plan of Care (Signed)
  Problem: Education: Goal: Knowledge of General Education information will improve Description Including pain rating scale, medication(s)/side effects and non-pharmacologic comfort measures 12/10/2018 1905 by Janan Halter, RN Outcome: Progressing 12/10/2018 1635 by Janan Halter, RN Outcome: Progressing   Problem: Health Behavior/Discharge Planning: Goal: Ability to manage health-related needs will improve 12/10/2018 1905 by Janan Halter, RN Outcome: Progressing 12/10/2018 1635 by Janan Halter, RN Outcome: Progressing   Problem: Clinical Measurements: Goal: Ability to maintain clinical measurements within normal limits will improve 12/10/2018 1905 by Janan Halter, RN Outcome: Progressing 12/10/2018 1635 by Janan Halter, RN Outcome: Progressing Goal: Will remain free from infection 12/10/2018 1905 by Janan Halter, RN Outcome: Progressing 12/10/2018 1635 by Janan Halter, RN Outcome: Progressing Goal: Diagnostic test results will improve 12/10/2018 1905 by Janan Halter, RN Outcome: Progressing 12/10/2018 1635 by Janan Halter, RN Outcome: Progressing Goal: Respiratory complications will improve 12/10/2018 1905 by Janan Halter, RN Outcome: Progressing 12/10/2018 1635 by Janan Halter, RN Outcome: Progressing Goal: Cardiovascular complication will be avoided 12/10/2018 1905 by Janan Halter, RN Outcome: Progressing 12/10/2018 1635 by Janan Halter, RN Outcome: Progressing   Problem: Activity: Goal: Risk for activity intolerance will decrease 12/10/2018 1905 by Janan Halter, RN Outcome: Progressing 12/10/2018 1635 by Janan Halter, RN Outcome: Progressing   Problem: Nutrition: Goal: Adequate nutrition will be maintained 12/10/2018 1905 by Janan Halter, RN Outcome: Progressing 12/10/2018 1635 by Janan Halter, RN Outcome: Progressing   Problem: Coping: Goal: Level of anxiety will decrease 12/10/2018 1905 by Janan Halter, RN Outcome: Progressing 12/10/2018 1635 by Janan Halter, RN Outcome: Progressing   Problem: Elimination: Goal: Will not experience complications related to bowel motility 12/10/2018 1905 by Janan Halter, RN Outcome: Progressing 12/10/2018 1635 by Janan Halter, RN Outcome: Progressing Goal: Will not experience complications related to urinary retention 12/10/2018 1905 by Janan Halter, RN Outcome: Progressing 12/10/2018 1635 by Janan Halter, RN Outcome: Progressing   Problem: Pain Managment: Goal: General experience of comfort will improve 12/10/2018 1905 by Janan Halter, RN Outcome: Progressing 12/10/2018 1635 by Janan Halter, RN Outcome: Progressing   Problem: Safety: Goal: Ability to remain free from injury will improve 12/10/2018 1905 by Janan Halter, RN Outcome: Progressing 12/10/2018 1635 by Janan Halter, RN Outcome: Progressing   Problem: Skin Integrity: Goal: Risk for impaired skin integrity will decrease 12/10/2018 1905 by Janan Halter, RN Outcome: Progressing 12/10/2018 1635 by Janan Halter, RN Outcome: Progressing

## 2018-12-10 NOTE — Social Work (Signed)
CSW acknowledging consult for SNF placement. Will follow for therapy recommendations. Current recommendations state pt has preference for [facility]. Per RNCM note "Patient from home alone but has two daughters who assist him. Patient states he already has [facility]. Called [name] with [facility] he will check to see if patient is active. Will need orders for resumption of care."   Octavio Graves, MSW, [facility] Clinical Social Work [phone]

## 2018-12-10 NOTE — Care Management Important Message (Signed)
Important Message  Patient Details  Name: Kyle Mcconnell MRN: [id number] Date of Birth: [birth year]   Medicare Important Message Given:  Yes    Dorena Bodo 12/10/2018, 3:42 PM

## 2018-12-10 NOTE — Plan of Care (Signed)
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
## 2018-12-11 ENCOUNTER — Ambulatory Visit (INDEPENDENT_AMBULATORY_CARE_PROVIDER_SITE_OTHER): Payer: Medicare Other | Admitting: Family

## 2018-12-11 ENCOUNTER — Encounter (HOSPITAL_COMMUNITY)
Admission: RE | Disposition: A | Discharge status: Home / Self Care | Payer: Self-pay | Source: Home / Self Care | Attending: Orthopedic Surgery

## 2018-12-11 ENCOUNTER — Inpatient Hospital Stay (HOSPITAL_COMMUNITY): Payer: Medicare Other | Admitting: Anesthesiology

## 2018-12-11 ENCOUNTER — Ambulatory Visit: Payer: Federal, State, Local not specified - PPO | Admitting: Physical Medicine & Rehabilitation

## 2018-12-11 DIAGNOSIS — T847XXD Infection and inflammatory reaction due to other internal orthopedic prosthetic devices, implants and grafts, subsequent encounter: Secondary | ICD-10-CM

## 2018-12-11 DIAGNOSIS — F4310 Post-traumatic stress disorder, unspecified: Secondary | ICD-10-CM

## 2018-12-11 DIAGNOSIS — M869 Osteomyelitis, unspecified: Secondary | ICD-10-CM

## 2018-12-11 DIAGNOSIS — B9689 Other specified bacterial agents as the cause of diseases classified elsewhere: Secondary | ICD-10-CM

## 2018-12-11 DIAGNOSIS — I10 Essential (primary) hypertension: Secondary | ICD-10-CM

## 2018-12-11 DIAGNOSIS — S81801D Unspecified open wound, right lower leg, subsequent encounter: Secondary | ICD-10-CM

## 2018-12-11 DIAGNOSIS — Z978 Presence of other specified devices: Secondary | ICD-10-CM

## 2018-12-11 DIAGNOSIS — R7303 Prediabetes: Secondary | ICD-10-CM

## 2018-12-11 DIAGNOSIS — T84622A Infection and inflammatory reaction due to internal fixation device of right tibia, initial encounter: Secondary | ICD-10-CM

## 2018-12-11 DIAGNOSIS — Z8781 Personal history of (healed) traumatic fracture: Secondary | ICD-10-CM

## 2018-12-11 HISTORY — PX: I&D EXTREMITY: SHX5045

## 2018-12-11 LAB — AEROBIC/ANAEROBIC CULTURE W GRAM STAIN (SURGICAL/DEEP WOUND)

## 2018-12-11 LAB — AEROBIC/ANAEROBIC CULTURE (SURGICAL/DEEP WOUND)

## 2018-12-11 SURGERY — IRRIGATION AND DEBRIDEMENT EXTREMITY
Anesthesia: General | Site: Ankle | Laterality: Right

## 2018-12-11 MED ORDER — METHOCARBAMOL 1000 MG/10ML IJ SOLN
500.0000 mg | Freq: Four times a day (QID) | INTRAVENOUS | Status: DC | PRN
  Filled 2018-12-11: qty 5

## 2018-12-11 MED ORDER — BISACODYL 10 MG RE SUPP: 10.0000 mg | Freq: Every day | RECTAL | Status: DC | PRN

## 2018-12-11 MED ORDER — PROPOFOL 10 MG/ML IV BOLUS
INTRAVENOUS | Status: DC | PRN
  Administered 2018-12-11: 150 mg via INTRAVENOUS

## 2018-12-11 MED ORDER — HYDROMORPHONE HCL 1 MG/ML IJ SOLN
0.2500 mg | INTRAMUSCULAR | Status: DC | PRN
  Administered 2018-12-11 (×2): 0.5 mg via INTRAVENOUS

## 2018-12-11 MED ORDER — DOCUSATE SODIUM 100 MG PO CAPS: 100.0000 mg | ORAL_CAPSULE | Freq: Two times a day (BID) | ORAL | Status: DC

## 2018-12-11 MED ORDER — ONDANSETRON HCL 4 MG/2ML IJ SOLN: 4.0000 mg | Freq: Four times a day (QID) | INTRAMUSCULAR | Status: DC | PRN

## 2018-12-11 MED ORDER — LIDOCAINE 2% (20 MG/ML) 5 ML SYRINGE
INTRAMUSCULAR | Status: DC | PRN
  Administered 2018-12-11: 80 mg via INTRAVENOUS

## 2018-12-11 MED ORDER — ONDANSETRON HCL 4 MG PO TABS: 4.0000 mg | ORAL_TABLET | Freq: Four times a day (QID) | ORAL | Status: DC | PRN

## 2018-12-11 MED ORDER — ONDANSETRON HCL 4 MG/2ML IJ SOLN
INTRAMUSCULAR | Status: DC | PRN
  Administered 2018-12-11: 4 mg via INTRAVENOUS

## 2018-12-11 MED ORDER — METHOCARBAMOL 500 MG PO TABS
500.0000 mg | ORAL_TABLET | Freq: Four times a day (QID) | ORAL | Status: DC | PRN
  Administered 2018-12-11 – 2018-12-12 (×4): 500 mg via ORAL
  Filled 2018-12-11 (×5): qty 1

## 2018-12-11 MED ORDER — SUCCINYLCHOLINE CHLORIDE 20 MG/ML IJ SOLN
INTRAMUSCULAR | Status: DC | PRN
  Administered 2018-12-11: 100 mg via INTRAVENOUS

## 2018-12-11 MED ORDER — OXYCODONE HCL 5 MG PO TABS
ORAL_TABLET | ORAL | Status: AC
  Filled 2018-12-11: qty 1

## 2018-12-11 MED ORDER — PHENYLEPHRINE HCL (PRESSORS) 10 MG/ML IV SOLN
INTRAVENOUS | Status: DC | PRN
  Administered 2018-12-11 (×2): 80 ug via INTRAVENOUS
  Administered 2018-12-11: 120 ug via INTRAVENOUS

## 2018-12-11 MED ORDER — OXYCODONE HCL 5 MG PO TABS
ORAL_TABLET | ORAL | Status: AC
  Filled 2018-12-11: qty 2

## 2018-12-11 MED ORDER — HYDROMORPHONE HCL 1 MG/ML IJ SOLN: 0.2500 mg | INTRAMUSCULAR | Status: DC | PRN

## 2018-12-11 MED ORDER — PROPOFOL 10 MG/ML IV BOLUS
INTRAVENOUS | Status: AC
  Filled 2018-12-11: qty 20

## 2018-12-11 MED ORDER — MIDAZOLAM HCL 5 MG/5ML IJ SOLN
INTRAMUSCULAR | Status: DC | PRN
  Administered 2018-12-11: 2 mg via INTRAVENOUS

## 2018-12-11 MED ORDER — LACTATED RINGERS IV SOLN
INTRAVENOUS | Status: DC | PRN
  Administered 2018-12-11: 07:00:00 via INTRAVENOUS

## 2018-12-11 MED ORDER — SODIUM CHLORIDE 0.9 % IR SOLN
Status: DC | PRN
  Administered 2018-12-11: 3000 mL

## 2018-12-11 MED ORDER — HYDROMORPHONE HCL 1 MG/ML IJ SOLN
INTRAMUSCULAR | Status: AC
  Filled 2018-12-11: qty 1

## 2018-12-11 MED ORDER — DEXAMETHASONE SODIUM PHOSPHATE 10 MG/ML IJ SOLN
INTRAMUSCULAR | Status: AC
  Filled 2018-12-11: qty 3

## 2018-12-11 MED ORDER — SUCCINYLCHOLINE CHLORIDE 200 MG/10ML IV SOSY
PREFILLED_SYRINGE | INTRAVENOUS | Status: AC
  Filled 2018-12-11: qty 20

## 2018-12-11 MED ORDER — EPHEDRINE 5 MG/ML INJ
INTRAVENOUS | Status: AC
  Filled 2018-12-11: qty 10

## 2018-12-11 MED ORDER — MAGNESIUM CITRATE PO SOLN: 1.0000 | Freq: Once | ORAL | Status: DC | PRN

## 2018-12-11 MED ORDER — ONDANSETRON HCL 4 MG/2ML IJ SOLN
INTRAMUSCULAR | Status: AC
  Filled 2018-12-11: qty 8

## 2018-12-11 MED ORDER — SODIUM CHLORIDE (PF) 0.9 % IJ SOLN
INTRAMUSCULAR | Status: AC
  Filled 2018-12-11: qty 10

## 2018-12-11 MED ORDER — ETOMIDATE 2 MG/ML IV SOLN
INTRAVENOUS | Status: AC
  Filled 2018-12-11: qty 10

## 2018-12-11 MED ORDER — METOCLOPRAMIDE HCL 5 MG PO TABS: 5.0000 mg | ORAL_TABLET | Freq: Three times a day (TID) | ORAL | Status: DC | PRN

## 2018-12-11 MED ORDER — 0.9 % SODIUM CHLORIDE (POUR BTL) OPTIME: TOPICAL | Status: DC | PRN

## 2018-12-11 MED ORDER — LIDOCAINE 2% (20 MG/ML) 5 ML SYRINGE
INTRAMUSCULAR | Status: AC
  Filled 2018-12-11: qty 15

## 2018-12-11 MED ORDER — FENTANYL CITRATE (PF) 250 MCG/5ML IJ SOLN
INTRAMUSCULAR | Status: AC
  Filled 2018-12-11: qty 5

## 2018-12-11 MED ORDER — METOCLOPRAMIDE HCL 5 MG/ML IJ SOLN: 5.0000 mg | Freq: Three times a day (TID) | INTRAMUSCULAR | Status: DC | PRN

## 2018-12-11 MED ORDER — PHENYLEPHRINE 40 MCG/ML (10ML) SYRINGE FOR IV PUSH (FOR BLOOD PRESSURE SUPPORT)
PREFILLED_SYRINGE | INTRAVENOUS | Status: AC
  Filled 2018-12-11: qty 20

## 2018-12-11 MED ORDER — POLYETHYLENE GLYCOL 3350 17 G PO PACK: 17.0000 g | PACK | Freq: Every day | ORAL | Status: DC | PRN

## 2018-12-11 MED ORDER — MIDAZOLAM HCL 2 MG/2ML IJ SOLN
INTRAMUSCULAR | Status: AC
  Filled 2018-12-11: qty 2

## 2018-12-11 MED ORDER — SODIUM CHLORIDE 0.9 % IV SOLN
INTRAVENOUS | Status: DC
  Administered 2018-12-11: 11:00:00 via INTRAVENOUS

## 2018-12-11 MED ORDER — FENTANYL CITRATE (PF) 100 MCG/2ML IJ SOLN
INTRAMUSCULAR | Status: DC | PRN
  Administered 2018-12-11 (×3): 50 ug via INTRAVENOUS
  Administered 2018-12-11: 100 ug via INTRAVENOUS

## 2018-12-11 SURGICAL SUPPLY — 40 items
BLADE SURG 12 STRL SS (BLADE) ×2 IMPLANT
BLADE SURG 21 STRL SS (BLADE) ×3 IMPLANT
BNDG COHESIVE 4X5 TAN STRL (GAUZE/BANDAGES/DRESSINGS) ×2 IMPLANT
BNDG COHESIVE 6X5 TAN STRL LF (GAUZE/BANDAGES/DRESSINGS) IMPLANT
BNDG GAUZE ELAST 4 BULKY (GAUZE/BANDAGES/DRESSINGS) ×4 IMPLANT
CONT SPEC 4OZ CLIKSEAL STRL BL (MISCELLANEOUS) ×2 IMPLANT
COVER MAYO STAND STRL (DRAPES) ×2 IMPLANT
COVER SURGICAL LIGHT HANDLE (MISCELLANEOUS) ×6 IMPLANT
COVER WAND RF STERILE (DRAPES) ×3 IMPLANT
DRAPE INCISE IOBAN 66X45 STRL (DRAPES) ×2 IMPLANT
DRAPE U-SHAPE 47X51 STRL (DRAPES) ×3 IMPLANT
DRESSING VERAFLO CLEANSE CC (GAUZE/BANDAGES/DRESSINGS) IMPLANT
DRSG ADAPTIC 3X8 NADH LF (GAUZE/BANDAGES/DRESSINGS) ×3 IMPLANT
DRSG VERAFLO CLEANSE CC (GAUZE/BANDAGES/DRESSINGS) ×3
DURAPREP 26ML APPLICATOR (WOUND CARE) ×3 IMPLANT
ELECT REM PT RETURN 9FT ADLT (ELECTROSURGICAL)
ELECTRODE REM PT RTRN 9FT ADLT (ELECTROSURGICAL) IMPLANT
GAUZE SPONGE 4X4 12PLY STRL (GAUZE/BANDAGES/DRESSINGS) ×3 IMPLANT
GLOVE BIOGEL PI IND STRL 9 (GLOVE) ×1 IMPLANT
GLOVE BIOGEL PI INDICATOR 9 (GLOVE) ×2
GLOVE SURG ORTHO 9.0 STRL STRW (GLOVE) ×3 IMPLANT
GOWN STRL REUS W/ TWL XL LVL3 (GOWN DISPOSABLE) ×2 IMPLANT
GOWN STRL REUS W/TWL XL LVL3 (GOWN DISPOSABLE) ×6
GRAFT MESHED PRIMATRIX 10X12 (Graft) ×2 IMPLANT
HANDPIECE INTERPULSE COAX TIP (DISPOSABLE)
KIT BASIN OR (CUSTOM PROCEDURE TRAY) ×3 IMPLANT
KIT TURNOVER KIT B (KITS) ×3 IMPLANT
MANIFOLD NEPTUNE II (INSTRUMENTS) ×3 IMPLANT
NS IRRIG 1000ML POUR BTL (IV SOLUTION) ×9 IMPLANT
PACK ORTHO EXTREMITY (CUSTOM PROCEDURE TRAY) ×3 IMPLANT
PAD ARMBOARD 7.5X6 YLW CONV (MISCELLANEOUS) ×6 IMPLANT
SET HNDPC FAN SPRY TIP SCT (DISPOSABLE) IMPLANT
STOCKINETTE IMPERVIOUS 9X36 MD (GAUZE/BANDAGES/DRESSINGS) IMPLANT
SUT ETHILON 2 0 PSLX (SUTURE) ×6 IMPLANT
SWAB COLLECTION DEVICE MRSA (MISCELLANEOUS) ×3 IMPLANT
SWAB CULTURE ESWAB REG 1ML (MISCELLANEOUS) IMPLANT
TOWEL OR 17X26 10 PK STRL BLUE (TOWEL DISPOSABLE) ×3 IMPLANT
TUBE CONNECTING 12'X1/4 (SUCTIONS) ×2
TUBE CONNECTING 12X1/4 (SUCTIONS) ×3 IMPLANT
YANKAUER SUCT BULB TIP NO VENT (SUCTIONS) ×5 IMPLANT

## 2018-12-11 NOTE — Anesthesia Procedure Notes (Signed)
Procedure Name: Intubation Date/Time: 12/11/2018 7:40 AM Performed by: Neldon Newport, CRNA Pre-anesthesia Checklist: Timeout performed, Patient being monitored, Suction available, Emergency Drugs available and Patient identified Patient Re-evaluated:Patient Re-evaluated prior to induction Oxygen Delivery Method: Circle system utilized Preoxygenation: Pre-oxygenation with 100% oxygen Induction Type: IV induction and Rapid sequence Laryngoscope Size: Mac and 4 Grade View: Grade I Tube type: Oral Tube size: 7.5 mm Number of attempts: 1 Placement Confirmation: ETT inserted through vocal cords under direct vision,  positive ETCO2 and breath sounds checked- equal and bilateral Secured at: 23 cm Tube secured with: Tape Dental Injury: Teeth and Oropharynx as per pre-operative assessment

## 2018-12-11 NOTE — Progress Notes (Signed)
CHG bath completed, report called to OR. Wound vac output @50ml  serous drainage. Pt was maintained NPO after MN, has voided prior to leaving for OR.

## 2018-12-11 NOTE — Progress Notes (Signed)
PT Cancellation Note  Patient Details Name: Kyle Mcconnell MRN: [id number] DOB: 1956-04-24   Cancelled Treatment:    Reason Eval/Treat Not Completed: Patient at procedure or test/unavailable. To OR for repeat I&D. Will follow-up for PT treatment as schedule permits.  Ina Homes, PT, DPT Acute Rehabilitation Services  Pager [phone] Office [phone]  Malachy Chamber 12/11/2018, 7:25 AM

## 2018-12-11 NOTE — Op Note (Signed)
12/11/2018  8:38 AM  PATIENT:  Kyle Mcconnell    PRE-OPERATIVE DIAGNOSIS:  Open Wound Right Leg with Exposed   POST-OPERATIVE DIAGNOSIS:  Same  PROCEDURE:  REPEAT DEBRIDEMENT RIGHT LEG WOUND with excision skin soft tissue muscle bone and tendon. Wrap the anterior tibial tendon with collagen graft. Collagen graft over the open wound 8 x 10 cm. Local tissue rearrangement for partial wound closure 10 x 5 cm. Application of cleanse choice wound VAC.Marland Kitchen Deep tissue culture sent including tendon soft tissue and bone  SURGEON:  Nadara Mustard, MD  PHYSICIAN ASSISTANT:None ANESTHESIA:   General  PREOPERATIVE INDICATIONS:  Kyle Mcconnell is a  63 y.o. male with a diagnosis of Open Wound Right Leg with Exposed Hardware who failed conservative measures and elected for surgical management.    The risks benefits and alternatives were discussed with the patient preoperatively including but not limited to the risks of infection, bleeding, nerve injury, cardiopulmonary complications, the need for revision surgery, among others, and the patient was willing to proceed.  OPERATIVE IMPLANTS: Neonatal bovine collagen graft 8 x 10 cm. Application of cleanse choice wound VAC.  @ENCIMAGES @  OPERATIVE FINDINGS: Patient did not have good petechial bleeding there was further necrotic tissue further necrotic tendon.  OPERATIVE PROCEDURE: Patient was brought the operating room and underwent a general anesthetic.  After adequate levels anesthesia were obtained patient's right lower extremity was prepped using Betadine paint and spray and draped into a sterile field a timeout was called.  The sutures were removed there was extensive hematoma and further nonviable tissue.  Approximately 50% of the anterior tibial tendon was soft and nonviable.  This was excised and sent for cultures.  There is further necrotic bone this was excised and removed.  The remainder bone did have good petechial bleeding.  A Cobb a  curette a rondure was used to excise skin and soft tissue muscle bone and tendon.  The wound was irrigated with normal saline.  The tissue was sent for cultures.  The anterior tibial tendon that was remaining and viable was wrapped in the bovine collagen graft this was then overlaid with the bovine collagen graft and local tissue rearrangement was used to partially close the wound 10 x 5 cm down to about 3 x 4 cm.  A Praveena wound VAC was applied this had a good suction fit patient was extubated taken to PACU in stable condition.   DISCHARGE PLANNING:  Antibiotic duration: Continue IV antibiotics at least 6 weeks  Weightbearing: Strict nonweightbearing on the right  Pain medication: Continue opioid pathway  Dressing care/ Wound VAC: Continue wound VAC for 1 week  Ambulatory devices: Walker or crutches  Discharge to: Anticipate discharge to home  Follow-up: In the office 1 week post operative.

## 2018-12-11 NOTE — Interval H&P Note (Signed)
History and Physical Interval Note:  12/11/2018 6:49 AM  Kyle Mcconnell  has presented today for surgery, with the diagnosis of Open Wound Right Leg with Exposed Hardware.  The various methods of treatment have been discussed with the patient and family. After consideration of risks, benefits and other options for treatment, the patient has consented to  Procedure(s): REPEAT DEBRIDEMENT RIGHT LEG WOUND, A-CELL, VAC, POSSIBLE SKIN GRAFT (Right) as a surgical intervention.  The patient's history has been reviewed, patient examined, no change in status, stable for surgery.  I have reviewed the patient's chart and labs.  Questions were answered to the patient's satisfaction.     Nadara Mustard

## 2018-12-11 NOTE — Transfer of Care (Signed)
Immediate Anesthesia Transfer of Care Note  Patient: Kyle Mcconnell  Procedure(s) Performed: REPEAT DEBRIDEMENT RIGHT LEG WOUND, A-CELL, VAC placement. (Right Ankle)  Patient Location: PACU  Anesthesia Type:General  Level of Consciousness: awake, alert  and oriented  Airway & Oxygen Therapy: Patient Spontanous Breathing and Patient connected to face mask oxygen  Post-op Assessment: Report given to RN, Post -op Vital signs reviewed and stable and Patient moving all extremities X 4  Post vital signs: Reviewed and stable  Last Vitals:  Vitals Value Taken Time  BP    Temp    Pulse    Resp    SpO2      Last Pain:  Vitals:   12/11/18 0525  TempSrc: Oral  PainSc:       Patients Stated Pain Goal: 2 (12/10/18 0913)  Complications: No apparent anesthesia complications

## 2018-12-11 NOTE — Plan of Care (Signed)
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
## 2018-12-11 NOTE — Anesthesia Postprocedure Evaluation (Signed)
Anesthesia Post Note  Patient: Kyle Mcconnell  Procedure(s) Performed: REPEAT DEBRIDEMENT RIGHT LEG WOUND, A-CELL, VAC placement. (Right Ankle)     Patient location during evaluation: PACU Anesthesia Type: General Level of consciousness: awake Pain management: pain level controlled Vital Signs Assessment: post-procedure vital signs reviewed and stable Respiratory status: spontaneous breathing Cardiovascular status: stable Anesthetic complications: no    Last Vitals:  Vitals:   12/11/18 1005 12/11/18 1327  BP: 140/83 (!) 177/90  Pulse: 82 86  Resp: 18 18  Temp: 36.7 C (!) 36.4 C  SpO2: 97% 100%    Last Pain:  Vitals:   12/11/18 1327  TempSrc: Oral  PainSc:                  Bridgit Eynon

## 2018-12-12 ENCOUNTER — Inpatient Hospital Stay: Payer: Self-pay

## 2018-12-12 ENCOUNTER — Encounter (HOSPITAL_COMMUNITY): Payer: Self-pay | Admitting: Orthopedic Surgery

## 2018-12-12 DIAGNOSIS — Z95828 Presence of other vascular implants and grafts: Secondary | ICD-10-CM

## 2018-12-12 DIAGNOSIS — F419 Anxiety disorder, unspecified: Secondary | ICD-10-CM

## 2018-12-12 LAB — BASIC METABOLIC PANEL
Anion gap: 12 (ref 5–15)
BUN: 14 mg/dL (ref 8–23)
CO2: 27 mmol/L (ref 22–32)
Calcium: 9.5 mg/dL (ref 8.9–10.3)
Chloride: 99 mmol/L (ref 98–111)
Creatinine, Ser: 0.83 mg/dL (ref 0.61–1.24)
GFR calc Af Amer: >60 mL/min (ref >60)
GFR calc non Af Amer: >60 mL/min (ref >60)
Glucose, Bld: 169 mg/dL — ABNORMAL HIGH (ref 70–99)
Potassium: 3.3 mmol/L — ABNORMAL LOW (ref 3.5–5.1)
Sodium: 138 mmol/L (ref 135–145)

## 2018-12-12 MED ORDER — VANCOMYCIN HCL 10 G IV SOLR
1500.0000 mg | Freq: Two times a day (BID) | INTRAVENOUS | Status: DC
  Administered 2018-12-12 – 2018-12-13 (×3): 1500 mg via INTRAVENOUS
  Filled 2018-12-12 (×5): qty 1500

## 2018-12-12 MED ORDER — SODIUM CHLORIDE 0.9% FLUSH
10.0000 mL | Freq: Two times a day (BID) | INTRAVENOUS | Status: DC
  Administered 2018-12-12: 10 mL

## 2018-12-12 MED ORDER — SODIUM CHLORIDE 0.9 % IV SOLN: 2.0000 g | Freq: Three times a day (TID) | INTRAVENOUS | 0 refills | Status: DC

## 2018-12-12 MED ORDER — SODIUM CHLORIDE 0.9% FLUSH: 10.0000 mL | INTRAVENOUS | Status: DC | PRN

## 2018-12-12 MED ORDER — METHOCARBAMOL 500 MG PO TABS: 500.0000 mg | ORAL_TABLET | Freq: Four times a day (QID) | ORAL | 1 refills | Status: DC | PRN

## 2018-12-12 MED ORDER — OXYCODONE HCL 5 MG PO TABS: 5.0000 mg | ORAL_TABLET | Freq: Four times a day (QID) | ORAL | 0 refills | Status: DC | PRN
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
## 2018-12-12 NOTE — Progress Notes (Signed)
RN informed pt this morning that per PA pt would not be discharging today due to waiting for cultures to come back that were taken yesterday. Pt became very upset by this. Pt states "I am taking this personally." RN informed pt that the doctors just wanted to make sure he is going home on the right antibiotic. RN informed Dr. Lorenso Courier of pt being upset and wanting to talk with him. RN explained to pt the need to more blood work and that new antibiotic was started. RN later received phone call from case manager that pt had talked to home health RN and began crying and saying that he was going to leave. RN then received call from PA about situation. RN went into room to speak with pt and inform him that MD was coming by to talk with him. Pt says that he is not taking any more IV medications while in the hospital. Will stay another night if needed and work with therapy in the morning. Pt calm during interacting just appears very frustrated. Fiance on phone during this interaction.

## 2018-12-12 NOTE — Progress Notes (Signed)
PHARMACY CONSULT NOTE FOR:  OUTPATIENT  PARENTERAL ANTIBIOTIC THERAPY (OPAT)  Indication: RLE osteomyelitis Regimen: Cefepime 2gm q8 End date: 01/18/2019  IV antibiotic discharge orders are pended. To discharging provider:  please sign these orders via discharge navigator,  Select New Orders & click on the button choice - Manage This Unsigned Work.     Thank you for allowing pharmacy to be a part of this patient's care.  Otho Bellows 12/12/2018, 8:19 AM

## 2018-12-12 NOTE — Progress Notes (Addendum)
Subjective: 1 Day Post-Op Procedure(s) (LRB): REPEAT DEBRIDEMENT RIGHT LEG WOUND, A-CELL, VAC placement. (Right) Wants to DC to home with HHC.  VAC canister with 25 cc drainage.   Objective: Vital signs in last 24 hours: Temp:  [97.5 F (36.4 C)-98.7 F (37.1 C)] 98.7 F (37.1 C) (04/30 0519) Pulse Rate:  [82-102] 91 (04/30 0519) Resp:  [14-20] 20 (04/30 0519) BP: (107-177)/(66-90) 129/76 (04/30 0519) SpO2:  [96 %-100 %] 98 % (04/30 0519)  Intake/Output from previous day: 04/29 0701 - 04/30 0700 In: 1200 [P.O.:585; I.V.:615] Out: 1550 [Urine:1500; Blood:50] Intake/Output this shift: No intake/output data recorded.  No results for input(s): HGB in the last 72 hours. No results for input(s): WBC, RBC, HCT, PLT in the last 72 hours. No results for input(s): NA, K, CL, CO2, BUN, CREATININE, GLUCOSE, CALCIUM in the last 72 hours. No results for input(s): LABPT, INR in the last 72 hours.  VAC dressing in place over the right lower leg and functioning well. Canister with 25 cc drainage.    Assessment/Plan: 1 Day Post-Op Procedure(s) (LRB): REPEAT DEBRIDEMENT RIGHT LEG WOUND, A-CELL, VAC placement. (Right)  Will DC with Prevena VAC therapy.  Plan DC home today with HHC IV antibiotics, cefepime 2 g q 8 hours for Enterobacter cloacae infection x 6 weeks per ID.  Follow up in the office next week.   Lazaro Arms, PA-C 12/12/2018, 7:08 AM  [facility] [phone]   Adden: Contacted by Dr. Lorenso Courier and will hold on DC until results of the cultures from yesterday finalized.   Codey Burling,PA-C
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
## 2018-12-12 NOTE — Progress Notes (Addendum)
Pharmacy Antibiotic Note  Kyle Mcconnell is a 63 y.o. male admitted on 12/06/2018 with open wound of right lower leg with hardware. Pt s/p removal of hardware and debridement x2. Pt had been treated with cefepime for enterobacter on tissue culture. First tissue culture now growing MRSE. Pharmacy has been consulted for Vancomycin dosing. Repeat tissue cultures from 4/29 pending. SCR 0.76, WBC WNL, afebrile.  Plan: Vancomycin 1500mg  IV loading dose followed by 1500mg  IV Q12hrs  Calculated AUC = 551 cmax = 35 cmin = 12.5 SCR used = 0.83 Continue cefepime 2g Q8hrs Follow cultures, clinical s/sx improvement, LOT, de-escalation  Height: 6' 1.5" (186.7 cm) Weight: 185 lb 3.1 oz (84 kg) IBW/kg (Calculated) : 81.05  Temp (24hrs), Avg:98.3 F (36.8 C), Min:97.5 F (36.4 C), Max:98.7 F (37.1 C)  Recent Labs  Lab 12/06/18 1005 12/07/18 0239  WBC 7.9  --   CREATININE 0.79 0.76    Estimated Creatinine Clearance: 107 mL/min (by C-G formula based on SCr of 0.76 mg/dL).    No Known Allergies  Antimicrobials this admission: 4/24 Vanc >> 4/27, 4/30>> 4/25 Zosyn >> 4/26 4/26 Cefepime >>  Dose adjustments this admission: NA  Microbiology results: 4/24 Tissue CX>> enterobacter cloacae, MRSE 4/29 Tissue CX>>  Thank you for allowing pharmacy to be a part of this patient's care.  Lenward Chancellor, PharmD PGY1 Pharmacy Resident 12/12/2018 10:21 AM

## 2018-12-12 NOTE — Progress Notes (Signed)
Peripherally Inserted Central Catheter/Midline Placement  The IV Nurse has discussed with the patient and/or persons authorized to consent for the patient, the purpose of this procedure and the potential benefits and risks involved with this procedure.  The benefits include less needle sticks, lab draws from the catheter, and the patient may be discharged home with the catheter. Risks include, but not limited to, infection, bleeding, blood clot (thrombus formation), and puncture of an artery; nerve damage and irregular heartbeat and possibility to perform a PICC exchange if needed/ordered by physician.  Alternatives to this procedure were also discussed.  Bard Power PICC patient education guide, fact sheet on infection prevention and patient information card has been provided to patient /or left at bedside.    PICC/Midline Placement Documentation        Annett Fabian 12/12/2018, 9:49 AM

## 2018-12-12 NOTE — Progress Notes (Signed)
OT Cancellation Note  Patient Details Name: Kyle Mcconnell MRN: [id number] DOB: [birth year]   Cancelled Treatment:    Reason Eval/Treat Not Completed: Patient declined. Pt reporting frustration regarding being admitted another day. Declining working on functional transfers.  Willing to work with therapy tomorrow morning.   Cipriano Mile OTR/L 12/12/2018, 12:42 PM
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
## 2018-12-13 MED ORDER — HEPARIN SOD (PORK) LOCK FLUSH 100 UNIT/ML IV SOLN
250.0000 [IU] | INTRAVENOUS | Status: AC | PRN
  Administered 2018-12-13: 250 [IU]

## 2018-12-13 MED ORDER — CEFEPIME IV (FOR PTA / DISCHARGE USE ONLY): 2.0000 g | Freq: Two times a day (BID) | INTRAVENOUS | 0 refills | Status: AC

## 2018-12-13 MED ORDER — CEFEPIME IV (FOR PTA / DISCHARGE USE ONLY): 2.0000 g | Freq: Two times a day (BID) | INTRAVENOUS | 0 refills | Status: DC

## 2018-12-13 NOTE — Progress Notes (Signed)
Subjective: 2 Days Post-Op Procedure(s) (LRB): REPEAT DEBRIDEMENT RIGHT LEG WOUND, A-CELL, VAC placement. (Right) Patient reports pain as mild and moderate.   Very anxious to DC home.   Objective: Vital signs in last 24 hours: Temp:  [98.2 F (36.8 C)-98.4 F (36.9 C)] 98.3 F (36.8 C) (05/01 0709) Pulse Rate:  [108-133] 133 (05/01 0709) Resp:  [18-21] 21 (05/01 0709) BP: (132-181)/(71-93) 181/89 (05/01 0709) SpO2:  [96 %-100 %] 100 % (05/01 0709)  Intake/Output from previous day: 04/30 0701 - 05/01 0700 In: 1822.6 [P.O.:720; I.V.:202.6; IV Piggyback:900] Out: 1200 [Urine:1200] Intake/Output this shift: No intake/output data recorded.  No results for input(s): HGB in the last 72 hours. No results for input(s): WBC, RBC, HCT, PLT in the last 72 hours. Recent Labs    12/12/18 1058  NA 138  K 3.3*  CL 99  CO2 27  BUN 14  CREATININE 0.83  GLUCOSE 169*  CALCIUM 9.5   No results for input(s): LABPT, INR in the last 72 hours.  VAC dressing in place over the right lower leg and functioning well. VAC canister with scant drainage.    Assessment/Plan: 2 Days Post-Op Procedure(s) (LRB): REPEAT DEBRIDEMENT RIGHT LEG WOUND, A-CELL, VAC placement. (Right) Plan DC later today pending recommendations from ID.  Will DC with HHC RN and PT  Lazaro Arms, PA-C 12/13/2018, 7:32 AM  [facility] [phone]
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
## 2018-12-13 NOTE — Progress Notes (Signed)
Patient refused to take his ordered IV antibiotics despite explanation, claimed that he doesn't want to take any IV meds from [facility] .
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
## 2018-12-13 NOTE — TOC Transition Note (Signed)
Transition of Care [facility]) - CM/SW Discharge Note   Patient Details  Name: CHADEN BATTIS MRN: [id number] Date of Birth: 18-Feb-1956  [facility]) CM/SW Contact:  Kingsley Plan, RN Phone Number: 12/13/2018, 11:38 AM   Clinical Narrative:    Patient discharging home today with [facility] ( Dan aware) and [facility] ( Pam aware).   Final next level of care: Home w Home Health Services Barriers to Discharge: No Barriers Identified   Patient Goals and CMS Choice Patient states their goals for this hospitalization and ongoing recovery are:: to go home  CMS Medicare.gov Compare Post Acute Care list provided to:: Patient Choice offered to / list presented to : Patient  Discharge Placement                       Discharge Plan and Services   Discharge Planning Services: CM Consult            DME Arranged: N/A DME Agency: NA         HH Agency: [facility] Date HH Agency Contacted: 12/09/18 Time HH Agency Contacted: 1236 Representative spoke with at [facility] Agency: [name]  Social Determinants of Health (SDOH) Interventions     Readmission Risk Interventions No flowsheet data found.

## 2018-12-13 NOTE — Progress Notes (Signed)
Pt was found on floor @0705 . Pt alert and verbal. Pt stated, "I was trying to reach for my jacket and my foot slipped from up under me". BP 181/89, T 97.9, HR 133, RR 22. Pt denies hitting head. No dizziness or light headedness noted. Dr. Lajoyce Corners was notified on site and stated to "continue to monitor".

## 2018-12-13 NOTE — Discharge Instructions (Signed)
Keep Prevena VAC machine plugged into wall outlet as much as possible to keep machine charged.   Keep the right leg elevated as much as possible.   Non weight bearing on the right leg.

## 2018-12-13 NOTE — Progress Notes (Signed)
Patient discharged to home with instructions and prescription. 

## 2018-12-16 ENCOUNTER — Other Ambulatory Visit: Payer: Self-pay | Admitting: Pharmacist

## 2018-12-17 ENCOUNTER — Ambulatory Visit (INDEPENDENT_AMBULATORY_CARE_PROVIDER_SITE_OTHER): Payer: Self-pay | Admitting: Physician Assistant

## 2018-12-17 ENCOUNTER — Other Ambulatory Visit: Payer: Self-pay

## 2018-12-17 ENCOUNTER — Ambulatory Visit (INDEPENDENT_AMBULATORY_CARE_PROVIDER_SITE_OTHER): Payer: Federal, State, Local not specified - PPO | Admitting: Orthopedic Surgery

## 2018-12-17 ENCOUNTER — Telehealth: Payer: Self-pay | Admitting: *Deleted

## 2018-12-17 ENCOUNTER — Encounter: Payer: Self-pay | Admitting: Orthopedic Surgery

## 2018-12-17 VITALS — Ht 73.5 in | Wt 185.2 lb

## 2018-12-17 DIAGNOSIS — T847XXS Infection and inflammatory reaction due to other internal orthopedic prosthetic devices, implants and grafts, sequela: Secondary | ICD-10-CM

## 2018-12-17 LAB — AEROBIC/ANAEROBIC CULTURE W GRAM STAIN (SURGICAL/DEEP WOUND)
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
## 2018-12-17 NOTE — Telephone Encounter (Signed)
Error

## 2018-12-18 ENCOUNTER — Telehealth: Payer: Self-pay | Admitting: Orthopedic Surgery

## 2018-12-18 NOTE — Telephone Encounter (Signed)
Angel-nurse with [facility] called needing skilled nursing orders for Osteomyelitis, lab draw,Picc line dressing change, IV antibiotics and infusion care. The number to contact Lawanna Kobus is [phone]

## 2018-12-18 NOTE — Telephone Encounter (Signed)
This pt is having surgery on Friday. Please see message below and advise what I need to do.

## 2018-12-19 ENCOUNTER — Ambulatory Visit: Payer: Medicare Other | Admitting: Orthopedic Surgery

## 2018-12-19 ENCOUNTER — Other Ambulatory Visit: Payer: Self-pay

## 2018-12-19 ENCOUNTER — Telehealth: Payer: Self-pay | Admitting: Orthopedic Surgery

## 2018-12-19 ENCOUNTER — Encounter (HOSPITAL_COMMUNITY): Payer: Self-pay | Admitting: *Deleted

## 2018-12-19 MED ORDER — HYDROMORPHONE HCL 1 MG/ML IJ SOLN: 0.2500 mg | INTRAMUSCULAR | Status: DC | PRN

## 2018-12-19 NOTE — Progress Notes (Addendum)
Kyle Mcconnell denies chest pain or shortness of breath.  Patient denies that he nor his family has experienced any of the following: Cough Fever >100.4 Runny Nose Sore Throat Difficulty breathing/ shortness of breath Travel in past 14 days -no.  I instructed Kyle Mcconnell of no solids after midnight.  Patient has pre- diabetes, patient's care giver will drive to the hospital and pick up G2- 12 ounces and patient will drink by 0430. Patient does not check CBG.

## 2018-12-19 NOTE — Telephone Encounter (Signed)
Morrie Sheldon from [facility] called in regard to verbal orders for  patient for Home Health Care 1 week for 3 weeks  1 PRN visit, pic line care, changing dressing, labs (skilled nursing)  Please call @ [phone].

## 2018-12-19 NOTE — Telephone Encounter (Signed)
I called the nurse this am and asked her to call my cell phone with any questions

## 2018-12-19 NOTE — Telephone Encounter (Signed)
I sent this to you yesterday...the patient having surgery tomorrow. What do you want me to do?

## 2018-12-20 ENCOUNTER — Other Ambulatory Visit: Payer: Self-pay

## 2018-12-20 ENCOUNTER — Ambulatory Visit (HOSPITAL_COMMUNITY): Payer: Federal, State, Local not specified - PPO | Admitting: Anesthesiology

## 2018-12-20 ENCOUNTER — Encounter (HOSPITAL_COMMUNITY): Payer: Self-pay

## 2018-12-20 ENCOUNTER — Encounter (HOSPITAL_COMMUNITY)
Admission: RE | Disposition: A | Discharge status: Home / Self Care | Payer: Self-pay | Source: Home / Self Care | Attending: Orthopedic Surgery

## 2018-12-20 ENCOUNTER — Ambulatory Visit (HOSPITAL_COMMUNITY)
Admission: RE | Admit: 2018-12-20 | Discharge: 2018-12-20 | Disposition: A | Discharge status: Home / Self Care | Payer: Federal, State, Local not specified - PPO | Attending: Orthopedic Surgery | Admitting: Orthopedic Surgery

## 2018-12-20 DIAGNOSIS — F329 Major depressive disorder, single episode, unspecified: Secondary | ICD-10-CM | POA: Diagnosis not present

## 2018-12-20 DIAGNOSIS — F4310 Post-traumatic stress disorder, unspecified: Secondary | ICD-10-CM | POA: Insufficient documentation

## 2018-12-20 DIAGNOSIS — M86661 Other chronic osteomyelitis, right tibia and fibula: Secondary | ICD-10-CM | POA: Insufficient documentation

## 2018-12-20 DIAGNOSIS — I10 Essential (primary) hypertension: Secondary | ICD-10-CM | POA: Diagnosis not present

## 2018-12-20 DIAGNOSIS — S81801S Unspecified open wound, right lower leg, sequela: Secondary | ICD-10-CM

## 2018-12-20 DIAGNOSIS — Z87891 Personal history of nicotine dependence: Secondary | ICD-10-CM | POA: Insufficient documentation

## 2018-12-20 DIAGNOSIS — M86461 Chronic osteomyelitis with draining sinus, right tibia and fibula: Secondary | ICD-10-CM | POA: Diagnosis not present

## 2018-12-20 HISTORY — DX: Personal history of other medical treatment: Z92.89

## 2018-12-20 HISTORY — PX: I&D EXTREMITY: SHX5045

## 2018-12-20 LAB — GLUCOSE, CAPILLARY: Glucose-Capillary: 127 mg/dL — ABNORMAL HIGH (ref 70–99)

## 2018-12-20 SURGERY — IRRIGATION AND DEBRIDEMENT EXTREMITY
Anesthesia: General | Laterality: Right

## 2018-12-20 MED ORDER — OXYCODONE HCL 5 MG/5ML PO SOLN: 5.0000 mg | Freq: Once | ORAL | Status: AC | PRN

## 2018-12-20 MED ORDER — DEXMEDETOMIDINE HCL IN NACL 200 MCG/50ML IV SOLN
INTRAVENOUS | Status: DC | PRN
  Administered 2018-12-20 (×2): 16 ug via INTRAVENOUS

## 2018-12-20 MED ORDER — PROPOFOL 10 MG/ML IV BOLUS
INTRAVENOUS | Status: DC | PRN
  Administered 2018-12-20: 140 mg via INTRAVENOUS

## 2018-12-20 MED ORDER — PHENYLEPHRINE 40 MCG/ML (10ML) SYRINGE FOR IV PUSH (FOR BLOOD PRESSURE SUPPORT)
PREFILLED_SYRINGE | INTRAVENOUS | Status: DC | PRN
  Administered 2018-12-20: 120 ug via INTRAVENOUS

## 2018-12-20 MED ORDER — FENTANYL CITRATE (PF) 100 MCG/2ML IJ SOLN
25.0000 ug | INTRAMUSCULAR | Status: DC | PRN
  Administered 2018-12-20 (×4): 25 ug via INTRAVENOUS

## 2018-12-20 MED ORDER — 0.9 % SODIUM CHLORIDE (POUR BTL) OPTIME
TOPICAL | Status: DC | PRN
  Administered 2018-12-20: 13:00:00 1000 mL

## 2018-12-20 MED ORDER — DEXAMETHASONE SODIUM PHOSPHATE 10 MG/ML IJ SOLN
INTRAMUSCULAR | Status: DC | PRN
  Administered 2018-12-20: 4 mg via INTRAVENOUS

## 2018-12-20 MED ORDER — LACTATED RINGERS IV SOLN
INTRAVENOUS | Status: DC
  Administered 2018-12-20: 10:00:00 via INTRAVENOUS

## 2018-12-20 MED ORDER — LIDOCAINE 2% (20 MG/ML) 5 ML SYRINGE
INTRAMUSCULAR | Status: DC | PRN
  Administered 2018-12-20: 100 mg via INTRAVENOUS

## 2018-12-20 MED ORDER — CHLORHEXIDINE GLUCONATE 4 % EX LIQD: 60.0000 mL | Freq: Once | CUTANEOUS | Status: DC

## 2018-12-20 MED ORDER — HEPARIN SOD (PORK) LOCK FLUSH 100 UNIT/ML IV SOLN: 250.0000 [IU] | INTRAVENOUS | Status: DC | PRN

## 2018-12-20 MED ORDER — ONDANSETRON HCL 4 MG/2ML IJ SOLN
INTRAMUSCULAR | Status: DC | PRN
  Administered 2018-12-20: 4 mg via INTRAVENOUS

## 2018-12-20 MED ORDER — KETOROLAC TROMETHAMINE 30 MG/ML IJ SOLN
INTRAMUSCULAR | Status: AC
  Filled 2018-12-20: qty 1

## 2018-12-20 MED ORDER — MEPERIDINE HCL 25 MG/ML IJ SOLN: 6.2500 mg | INTRAMUSCULAR | Status: DC | PRN

## 2018-12-20 MED ORDER — ONDANSETRON HCL 4 MG/2ML IJ SOLN
INTRAMUSCULAR | Status: AC
  Filled 2018-12-20: qty 2

## 2018-12-20 MED ORDER — ACETAMINOPHEN 160 MG/5ML PO SOLN: 325.0000 mg | ORAL | Status: DC | PRN

## 2018-12-20 MED ORDER — MIDAZOLAM HCL 2 MG/2ML IJ SOLN
INTRAMUSCULAR | Status: DC | PRN
  Administered 2018-12-20: 2 mg via INTRAVENOUS

## 2018-12-20 MED ORDER — EPHEDRINE 5 MG/ML INJ
INTRAVENOUS | Status: AC
  Filled 2018-12-20: qty 10

## 2018-12-20 MED ORDER — FENTANYL CITRATE (PF) 100 MCG/2ML IJ SOLN
INTRAMUSCULAR | Status: DC | PRN
  Administered 2018-12-20: 50 ug via INTRAVENOUS
  Administered 2018-12-20: 100 ug via INTRAVENOUS

## 2018-12-20 MED ORDER — DEXMEDETOMIDINE HCL IN NACL 200 MCG/50ML IV SOLN
INTRAVENOUS | Status: AC
  Filled 2018-12-20: qty 50

## 2018-12-20 MED ORDER — ACETAMINOPHEN 325 MG PO TABS: 325.0000 mg | ORAL_TABLET | ORAL | Status: DC | PRN

## 2018-12-20 MED ORDER — KETOROLAC TROMETHAMINE 30 MG/ML IJ SOLN
INTRAMUSCULAR | Status: DC | PRN
  Administered 2018-12-20: 30 mg via INTRAVENOUS

## 2018-12-20 MED ORDER — OXYCODONE HCL 5 MG PO TABS
5.0000 mg | ORAL_TABLET | Freq: Once | ORAL | Status: AC | PRN
  Administered 2018-12-20: 5 mg via ORAL

## 2018-12-20 MED ORDER — FENTANYL CITRATE (PF) 250 MCG/5ML IJ SOLN
INTRAMUSCULAR | Status: AC
  Filled 2018-12-20: qty 5

## 2018-12-20 MED ORDER — SUCCINYLCHOLINE CHLORIDE 200 MG/10ML IV SOSY
PREFILLED_SYRINGE | INTRAVENOUS | Status: AC
  Filled 2018-12-20: qty 10

## 2018-12-20 MED ORDER — ONDANSETRON HCL 4 MG/2ML IJ SOLN: 4.0000 mg | Freq: Once | INTRAMUSCULAR | Status: DC | PRN

## 2018-12-20 MED ORDER — FENTANYL CITRATE (PF) 100 MCG/2ML IJ SOLN
INTRAMUSCULAR | Status: AC
  Filled 2018-12-20: qty 2

## 2018-12-20 MED ORDER — MIDAZOLAM HCL 2 MG/2ML IJ SOLN
INTRAMUSCULAR | Status: AC
  Filled 2018-12-20: qty 2

## 2018-12-20 MED ORDER — OXYCODONE HCL 5 MG PO TABS
ORAL_TABLET | ORAL | Status: AC
  Filled 2018-12-20: qty 1

## 2018-12-20 MED ORDER — HEPARIN SOD (PORK) LOCK FLUSH 100 UNIT/ML IV SOLN
250.0000 [IU] | Freq: Every day | INTRAVENOUS | Status: DC
  Administered 2018-12-20: 250 [IU]

## 2018-12-20 MED ORDER — SUCCINYLCHOLINE CHLORIDE 200 MG/10ML IV SOSY
PREFILLED_SYRINGE | INTRAVENOUS | Status: DC | PRN
  Administered 2018-12-20: 140 mg via INTRAVENOUS

## 2018-12-20 SURGICAL SUPPLY — 40 items
BLADE SURG 21 STRL SS (BLADE) ×3 IMPLANT
BNDG COHESIVE 6X5 TAN STRL LF (GAUZE/BANDAGES/DRESSINGS) ×4 IMPLANT
BNDG GAUZE ELAST 4 BULKY (GAUZE/BANDAGES/DRESSINGS) ×4 IMPLANT
COVER SURGICAL LIGHT HANDLE (MISCELLANEOUS) ×6 IMPLANT
COVER WAND RF STERILE (DRAPES) ×1 IMPLANT
DRAPE U-SHAPE 47X51 STRL (DRAPES) ×3 IMPLANT
DRESSING VERAFLO CLEANSE CC (GAUZE/BANDAGES/DRESSINGS) IMPLANT
DRSG ADAPTIC 3X8 NADH LF (GAUZE/BANDAGES/DRESSINGS) ×1 IMPLANT
DRSG VERAFLO CLEANSE CC (GAUZE/BANDAGES/DRESSINGS) ×3
DURAPREP 26ML APPLICATOR (WOUND CARE) ×3 IMPLANT
ELECT REM PT RETURN 9FT ADLT (ELECTROSURGICAL)
ELECTRODE REM PT RTRN 9FT ADLT (ELECTROSURGICAL) IMPLANT
GAUZE SPONGE 4X4 12PLY STRL (GAUZE/BANDAGES/DRESSINGS) ×3 IMPLANT
GAUZE SPONGE 4X4 12PLY STRL LF (GAUZE/BANDAGES/DRESSINGS) ×2 IMPLANT
GLOVE BIOGEL PI IND STRL 9 (GLOVE) ×1 IMPLANT
GLOVE BIOGEL PI INDICATOR 9 (GLOVE) ×2
GLOVE SURG ORTHO 9.0 STRL STRW (GLOVE) ×3 IMPLANT
GOWN STRL REUS W/ TWL XL LVL3 (GOWN DISPOSABLE) ×2 IMPLANT
GOWN STRL REUS W/TWL XL LVL3 (GOWN DISPOSABLE) ×6
GRAFT MESHED PRIMATRIX 8X12 (Graft) ×2 IMPLANT
HANDPIECE INTERPULSE COAX TIP (DISPOSABLE)
KIT BASIN OR (CUSTOM PROCEDURE TRAY) ×3 IMPLANT
KIT PREVENA INCISION MGT20CM45 (CANNISTER) ×2 IMPLANT
KIT TURNOVER KIT B (KITS) ×3 IMPLANT
MANIFOLD NEPTUNE II (INSTRUMENTS) ×3 IMPLANT
MICROMATRIX 1000MG (Tissue) ×3 IMPLANT
NS IRRIG 1000ML POUR BTL (IV SOLUTION) ×3 IMPLANT
PACK ORTHO EXTREMITY (CUSTOM PROCEDURE TRAY) ×3 IMPLANT
PAD ARMBOARD 7.5X6 YLW CONV (MISCELLANEOUS) ×6 IMPLANT
SET HNDPC FAN SPRY TIP SCT (DISPOSABLE) IMPLANT
SOLUTION PARTIC MCRMTRX 1000MG (Tissue) IMPLANT
SPLINT FIBERGLASS 4X30 (CAST SUPPLIES) ×4 IMPLANT
STOCKINETTE IMPERVIOUS 9X36 MD (GAUZE/BANDAGES/DRESSINGS) IMPLANT
SUT ETHILON 2 0 PSLX (SUTURE) ×4 IMPLANT
SWAB COLLECTION DEVICE MRSA (MISCELLANEOUS) ×3 IMPLANT
SWAB CULTURE ESWAB REG 1ML (MISCELLANEOUS) ×2 IMPLANT
TOWEL OR 17X26 10 PK STRL BLUE (TOWEL DISPOSABLE) ×3 IMPLANT
TUBE CONNECTING 12'X1/4 (SUCTIONS) ×1
TUBE CONNECTING 12X1/4 (SUCTIONS) ×2 IMPLANT
YANKAUER SUCT BULB TIP NO VENT (SUCTIONS) ×3 IMPLANT

## 2018-12-20 NOTE — Op Note (Signed)
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 DEBRIDEMENT RIGHT ANKLE WITH EXCISION SKIN SOFT TISSUE MUSCLE FASCIA AND TIBIA AND APPLY COLLAGEN GRAFT, with 1 g ACell powder and an 8 x 12 sheet of Integra graft. Local tissue rearrangement for wound closure 17 x 5 cm. Application of cleanse choice and wound VAC.  SURGEON:  Nadara Mustard, MD  PHYSICIAN ASSISTANT:None ANESTHESIA:   General  PREOPERATIVE INDICATIONS:  Kyle Mcconnell is a  63 y.o. male with a diagnosis of Right Leg Wound who failed conservative measures and elected for surgical management.    The risks benefits and alternatives were discussed with the patient preoperatively including but not limited to the risks of infection, bleeding, nerve injury, cardiopulmonary complications, the need for revision surgery, among others, and the patient was willing to proceed.  OPERATIVE IMPLANTS: Integra Prograft 8 x 12 cm and 1 g ACell powder  @ENCIMAGES @  OPERATIVE FINDINGS: Patient had nonviable bone from the articular surface of the tibia which required excision of the bone soft tissue fascia and muscle.  The periwound soft tissue had mild ischemic changes.  OPERATIVE PROCEDURE: Patient was brought the operating room and underwent a general anesthetic.  After adequate levels anesthesia were obtained patient's right lower extremity was prepped using Betadine paint after scrubbing with ChloraPrep and draped into a sterile field.  A timeout was called.  The external fixator was removed without complications the pin tracks were cleansed with a curette and irrigated.  The surgical incision was opened and approximately half a centimeter circumferentially around the incision was excised the incision edges were not healthy and not viable.  Patient had  complete necrosis of the anterior tibial tendon this was previously partially resected but was now completely nonviable.  The anterior tibial tendon was resected.  Anticipate if the wound does heal patient will need a tibial calcaneal nail for fusion.  There is still nonunion of the tibia.  There is a large piece of necrotic tibial bone anterior laterally this was excised the dome of the talus was visible.  A curette was used to further debride tibia.  This did debride back to healthy viable petechial bleeding bone.  More soft tissue was sharply excised with a rondure and a 21 blade knife.  After all nonviable tissue was excised the wound was irrigated with normal saline.  The wound was then filled with 1 g of ACell powder.  The wound was covered with the Integra graft and then using local tissue rearrangement the skin was closed over this graft which partially close the wound 17 x 5 cm.  This left a wound opening of approximately 3 cm in diameter.  The cleanse choice wound VAC was applied this had a good suction fit.  This was then overwrapped with Kerlix Covan and a posterior and sugar tong splint was applied with fiberglass.  This was then covered with Covan patient was extubated taken the PACU in stable condition.   DISCHARGE PLANNING:  Antibiotic duration: Continue IV antibiotics with his PICC line.  Weightbearing: Nonweightbearing on the right  Pain medication: Patient has Percocet at home  Dressing care/ Wound VAC: Continue wound VAC until follow-up  Ambulatory devices: Crutches walker wheelchair  Discharge to: Home.  Follow-up: In the office 1 week post operative.

## 2018-12-20 NOTE — Transfer of Care (Signed)
Immediate Anesthesia Transfer of Care Note  Patient: Kyle Mcconnell  Procedure(s) Performed: DEBRIDEMENT RIGHT ANKLE AND APPLY COLLAGEN GRAFT (Right )  Patient Location: PACU  Anesthesia Type:General  Level of Consciousness: sedated  Airway & Oxygen Therapy: Patient Spontanous Breathing, Patient connected to nasal cannula oxygen and oral airway  Post-op Assessment: Report given to RN and Post -op Vital signs reviewed and stable  Post vital signs: Reviewed and stable  Last Vitals:  Vitals Value Taken Time  BP 117/73 12/20/2018  1:29 PM  Temp    Pulse 86 12/20/2018  1:34 PM  Resp 17 12/20/2018  1:34 PM  SpO2 100 % 12/20/2018  1:34 PM  Vitals shown include unvalidated device data.  Last Pain:  Vitals:   12/20/18 1000  TempSrc:   PainSc: 0-No pain         Complications: No apparent anesthesia complications

## 2018-12-20 NOTE — Telephone Encounter (Signed)
I called and lm on vm to advise to return call. Dr. Lajoyce Corners tried to reach her and had advised to contact him directly to discuss. I gave Dr. Audrie Lia phone number again and asked that she return his call.

## 2018-12-20 NOTE — Anesthesia Preprocedure Evaluation (Addendum)
Anesthesia Evaluation  Patient identified by MRN, date of birth, ID band Patient awake  General Assessment Comment:Complication of anesthesia    "loopy, combative when waking up    Reviewed: Allergy & Precautions, NPO status , Patient's Chart, lab work & pertinent test results  History of Anesthesia Complications (+) history of anesthetic complications  Airway Mallampati: II  TM Distance: >3 FB     Dental   Pulmonary pneumonia, former smoker,    breath sounds clear to auscultation       Cardiovascular hypertension,  Rhythm:Regular Rate:Normal     Neuro/Psych    GI/Hepatic GERD  ,(+) Hepatitis -, C  Endo/Other    Renal/GU Renal disease     Musculoskeletal   Abdominal   Peds  Hematology   Anesthesia Other Findings   Reproductive/Obstetrics                            Anesthesia Physical  Anesthesia Plan  ASA: III  Anesthesia Plan: General   Post-op Pain Management:    Induction: Intravenous  PONV Risk Score and Plan: Ondansetron, Dexamethasone and Midazolam  Airway Management Planned: Oral ETT and LMA  Additional Equipment:   Intra-op Plan:   Post-operative Plan: Possible Post-op intubation/ventilation  Informed Consent: I have reviewed the patients History and Physical, chart, labs and discussed the procedure including the risks, benefits and alternatives for the proposed anesthesia with the patient or authorized representative who has indicated his/her understanding and acceptance.     Dental advisory given  Plan Discussed with: Anesthesiologist, CRNA and Surgeon  Anesthesia Plan Comments: (  )        Anesthesia Quick Evaluation

## 2018-12-20 NOTE — Anesthesia Procedure Notes (Signed)
Procedure Name: Intubation Date/Time: 12/20/2018 12:36 PM Performed by: Leonor Liv, CRNA Pre-anesthesia Checklist: Patient identified, Emergency Drugs available, Suction available and Patient being monitored Patient Re-evaluated:Patient Re-evaluated prior to induction Oxygen Delivery Method: Circle System Utilized Preoxygenation: Pre-oxygenation with 100% oxygen Induction Type: IV induction Ventilation: Mask ventilation without difficulty Laryngoscope Size: Mac and 4 Grade View: Grade I Tube type: Oral Tube size: 7.5 mm Number of attempts: 1 Airway Equipment and Method: Stylet and Oral airway Placement Confirmation: ETT inserted through vocal cords under direct vision,  positive ETCO2 and breath sounds checked- equal and bilateral Secured at: 23 cm Tube secured with: Tape Dental Injury: Teeth and Oropharynx as per pre-operative assessment

## 2018-12-20 NOTE — Progress Notes (Signed)
Per Dr. Tacy Dura, PICC line okay to use
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
## 2018-12-22 NOTE — Anesthesia Postprocedure Evaluation (Signed)
Anesthesia Post Note  Patient: Kyle Mcconnell  Procedure(s) Performed: DEBRIDEMENT RIGHT ANKLE AND APPLY COLLAGEN GRAFT (Right )     Patient location during evaluation: PACU Anesthesia Type: General Level of consciousness: awake and alert Pain management: pain level controlled Vital Signs Assessment: post-procedure vital signs reviewed and stable Respiratory status: spontaneous breathing, nonlabored ventilation, respiratory function stable and patient connected to nasal cannula oxygen Cardiovascular status: blood pressure returned to baseline and stable Postop Assessment: no apparent nausea or vomiting Anesthetic complications: no    Last Vitals:  Vitals:   12/20/18 1515 12/20/18 1530  BP: (!) 143/93 139/90  Pulse: 90 86  Resp: 18 15  Temp:  (!) 36.1 C  SpO2: 99% 96%    Last Pain:  Vitals:   12/20/18 1530  TempSrc:   PainSc: 6                  Buford Gayler

## 2018-12-23 ENCOUNTER — Ambulatory Visit (INDEPENDENT_AMBULATORY_CARE_PROVIDER_SITE_OTHER): Payer: Federal, State, Local not specified - PPO | Admitting: Orthopedic Surgery

## 2018-12-23 ENCOUNTER — Encounter: Payer: Self-pay | Admitting: Orthopedic Surgery

## 2018-12-23 ENCOUNTER — Other Ambulatory Visit: Payer: Self-pay

## 2018-12-23 ENCOUNTER — Telehealth: Payer: Self-pay | Admitting: Orthopedic Surgery

## 2018-12-23 VITALS — Ht 73.0 in | Wt 185.0 lb

## 2018-12-23 DIAGNOSIS — T847XXS Infection and inflammatory reaction due to other internal orthopedic prosthetic devices, implants and grafts, sequela: Secondary | ICD-10-CM

## 2018-12-23 MED ORDER — OXYCODONE HCL 5 MG PO TABS: 5.0000 mg | ORAL_TABLET | Freq: Four times a day (QID) | ORAL | 0 refills | Status: DC | PRN

## 2018-12-23 MED ORDER — SILVER SULFADIAZINE 1 % EX CREA: 1.0000 "application " | TOPICAL_CREAM | Freq: Every day | CUTANEOUS | 0 refills | Status: DC

## 2018-12-23 NOTE — Telephone Encounter (Signed)
Pt's vac is alarming made an appt for today. Can you please add onto the sch for Dr. Lajoyce Corners to see today.  Will address medication today.

## 2018-12-23 NOTE — Telephone Encounter (Signed)
Felicia called about pt stating that his pump is full and if she needed to take it off or what she needed to do to it he comes in tomorrow.  Also would like a refill on pain medication
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
## 2018-12-24 ENCOUNTER — Telehealth: Payer: Self-pay

## 2018-12-24 ENCOUNTER — Ambulatory Visit: Payer: Federal, State, Local not specified - PPO | Admitting: Orthopedic Surgery

## 2018-12-24 ENCOUNTER — Telehealth: Payer: Self-pay | Admitting: Orthopedic Surgery

## 2018-12-24 NOTE — Telephone Encounter (Signed)
I tried to call Amy back but LVM stating: Apply silvadene /adaptic to the wound bed, cover with gauze and Ace wrapping and continue non weight bearing in fracture boot. Orders for home health for dressing changes as above were written.   If [facility] calls back and need orders faxed please get fax number thank you. I left a verbal order on voicemail

## 2018-12-24 NOTE — Telephone Encounter (Signed)
Amy/[facility]/Nursing called stated they are going to draw labs today but patient advised they were supposed to do dressing changing and they have no orders.  Please Amy to advise [phone] she is going tp patient home @12 

## 2018-12-24 NOTE — Telephone Encounter (Signed)
I called pt that his visit will be video due to COVID 19. I receive verbal consent to do video and to file insurance.Pt stated his fiancee Sunny Schlein has a iphone and email address for video. I spoke with Sunny Schlein and confirmed email address. Felicia stated she can access emails on her phone. I explain the doxy process about logging in 5-10 minutes prior to visit and clicking link and typing pts first and last name and make sure camera is on. She verbalized understanding.

## 2018-12-24 NOTE — Telephone Encounter (Signed)
Email sent to [facility] pts fiancee on 12/24/2018 at 519pm.

## 2018-12-25 LAB — AEROBIC/ANAEROBIC CULTURE W GRAM STAIN (SURGICAL/DEEP WOUND)
Culture: NO GROWTH
Gram Stain: NONE SEEN

## 2018-12-26 ENCOUNTER — Ambulatory Visit (INDEPENDENT_AMBULATORY_CARE_PROVIDER_SITE_OTHER): Payer: Federal, State, Local not specified - PPO | Admitting: Adult Health

## 2018-12-26 ENCOUNTER — Other Ambulatory Visit: Payer: Self-pay

## 2018-12-26 DIAGNOSIS — I10 Essential (primary) hypertension: Secondary | ICD-10-CM | POA: Diagnosis not present

## 2018-12-26 DIAGNOSIS — R471 Dysarthria and anarthria: Secondary | ICD-10-CM

## 2018-12-26 DIAGNOSIS — G8191 Hemiplegia, unspecified affecting right dominant side: Secondary | ICD-10-CM

## 2018-12-26 DIAGNOSIS — M62838 Other muscle spasm: Secondary | ICD-10-CM | POA: Diagnosis not present

## 2018-12-26 DIAGNOSIS — I639 Cerebral infarction, unspecified: Secondary | ICD-10-CM

## 2018-12-26 DIAGNOSIS — I679 Cerebrovascular disease, unspecified: Secondary | ICD-10-CM | POA: Diagnosis not present

## 2018-12-26 MED ORDER — BACLOFEN 5 MG PO TABS: 5.0000 mg | ORAL_TABLET | Freq: Three times a day (TID) | ORAL | 0 refills | Status: DC
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
## 2018-12-27 ENCOUNTER — Encounter: Payer: Self-pay | Admitting: Adult Health

## 2018-12-30 ENCOUNTER — Ambulatory Visit (INDEPENDENT_AMBULATORY_CARE_PROVIDER_SITE_OTHER): Payer: Federal, State, Local not specified - PPO | Admitting: Orthopedic Surgery

## 2018-12-30 ENCOUNTER — Other Ambulatory Visit: Payer: Self-pay

## 2018-12-30 ENCOUNTER — Encounter: Payer: Self-pay | Admitting: Orthopedic Surgery

## 2018-12-30 VITALS — Ht 73.0 in | Wt 185.0 lb

## 2018-12-30 DIAGNOSIS — T847XXS Infection and inflammatory reaction due to other internal orthopedic prosthetic devices, implants and grafts, sequela: Secondary | ICD-10-CM
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
## 2018-12-30 NOTE — Progress Notes (Signed)
I agree with the above plan 

## 2019-01-07 ENCOUNTER — Ambulatory Visit (INDEPENDENT_AMBULATORY_CARE_PROVIDER_SITE_OTHER): Payer: Federal, State, Local not specified - PPO | Admitting: Orthopedic Surgery

## 2019-01-07 ENCOUNTER — Ambulatory Visit (INDEPENDENT_AMBULATORY_CARE_PROVIDER_SITE_OTHER): Payer: Federal, State, Local not specified - PPO | Admitting: Infectious Diseases

## 2019-01-07 ENCOUNTER — Encounter: Payer: Self-pay | Admitting: Infectious Diseases

## 2019-01-07 ENCOUNTER — Other Ambulatory Visit: Payer: Self-pay

## 2019-01-07 ENCOUNTER — Encounter: Payer: Self-pay | Admitting: Orthopedic Surgery

## 2019-01-07 VITALS — Ht 73.0 in | Wt 185.0 lb

## 2019-01-07 VITALS — BP 121/83 | HR 116

## 2019-01-07 DIAGNOSIS — M86161 Other acute osteomyelitis, right tibia and fibula: Secondary | ICD-10-CM | POA: Diagnosis not present

## 2019-01-07 DIAGNOSIS — F4310 Post-traumatic stress disorder, unspecified: Secondary | ICD-10-CM | POA: Diagnosis not present

## 2019-01-07 DIAGNOSIS — T847XXS Infection and inflammatory reaction due to other internal orthopedic prosthetic devices, implants and grafts, sequela: Secondary | ICD-10-CM

## 2019-01-07 NOTE — Progress Notes (Signed)
Subjective:    Patient ID: Kyle Mcconnell, male    DOB: 01/30/1956, 63 y.o.   MRN: [id number]  HPI patient is a 63 year old African-American male with a history of PTSD and prior stroke who suffered a comminuted right ankle fracture in February 2020 and subsequently has developed right ankle osteomyelitis/prosthetic joint infection.  Our service saw him during his recent hospitalization in late April and bridging until early May at [facility].  His ankle hardware was removed at the time of his surgery in early April.  Operative cultures were initially polymicrobial with Enterobacter cloacae, Finegoldia magna, and coag negative staph.  He underwent several debridements as an inpatient with repeat operative cultures confirming only Enterobacter, allowing for de-escalation of his antibiotics to cefepime monotherapy.  The patient proved to be a significant behavioral challenge towards the end of his admission as he was off his psychotropic medications and had a near psychotic break with extreme paranoia, becoming extremely aggressive and uncooperative with all medical providers.  Since his discharge home, his psychiatric issues have markedly improved.  His wife is present today at his visit and states that he has been doing well and tolerating his medications without event.  The patient actually apologized for his behavior as an inpatient and expressed gratitude regarding the care he was receiving.  Outpatient laboratories show an improvement in his white blood cell count as well as a CRP.  He did have a further debridement performed as an outpatient that showed negative cultures, so we will adjust slightly his end date to January 23, 2019.  The patient was in agreement with this transition.  He should not require further oral antibiotics as all of his hardware has been removed.  I have asked the patient to take pictures of his wound to bring with him to his next visit as his bandaging today precludes  direct visualization.  Per the patient's wife, his wound has been looking "much better."   Past Medical History:  Diagnosis Date   AKI (acute kidney injury) ([place]) 09/2018   Rhabdonyolsis- resolved   Anxiety    Complication of anesthesia    "loopy, combative when waking up"   Depression    GERD (gastroesophageal reflux disease)    Hepatitis C    Treated- 2016 ish   High cholesterol    History of blood transfusion    History of hepatitis C    HTN (hypertension)    Memory change    post accident 09/2018   Post traumatic stress disorder (PTSD)    Pre-diabetes    PTSD (post-traumatic stress disorder)    PTSD (post-traumatic stress disorder)    Stroke ([place]) 09/2018   Left Basal Ganglia Ischemic Infarct, weakness and right sided weakness, right leg shakes when stressed    Past Surgical History:  Procedure Laterality Date   COLONOSCOPY     EXTERNAL FIXATION LEG Right 09/30/2018   Procedure: EXTERNAL FIXATION LEG;  Surgeon: Nicholes Stairs, MD;  Location: [place];  Service: Orthopedics;  Laterality: Right;   EXTERNAL FIXATION LEG Right 10/03/2018   Procedure: EXTERNAL FIXATION LEG;  Surgeon: Shona Needles, MD;  Location: [place];  Service: Orthopedics;  Laterality: Right;   HARDWARE REMOVAL Right 12/06/2018   Procedure: REMOVAL DEEP HARDWARE RIGHT LEG APPLICATION EXTERNAL FIXATOR;  Surgeon: Newt Minion, MD;  Location: [place];  Service: Orthopedics;  Laterality: Right;   I&D EXTREMITY Right 12/06/2018   Procedure: WOUND DEBRIDEMENT RIGHT LEG;  Surgeon: Newt Minion,  MD;  Location: [place];  Service: Orthopedics;  Laterality: Right;   I&D EXTREMITY Right 12/11/2018   Procedure: REPEAT DEBRIDEMENT RIGHT LEG WOUND, A-CELL, VAC placement.;  Surgeon: Newt Minion, MD;  Location: [place];  Service: Orthopedics;  Laterality: Right;   I&D EXTREMITY Right 12/20/2018   Procedure: DEBRIDEMENT RIGHT ANKLE AND APPLY COLLAGEN GRAFT;  Surgeon: Newt Minion, MD;  Location: [place];  Service: Orthopedics;  Laterality: Right;   IR ANGIOGRAM EXTREMITY RIGHT  09/30/2018   IR ANGIOGRAM PELVIS SELECTIVE OR SUPRASELECTIVE  09/30/2018   IR ANGIOGRAM SELECTIVE EACH ADDITIONAL VESSEL  09/30/2018   IR EMBO ART  VEN HEMORR LYMPH EXTRAV  INC GUIDE ROADMAPPING  09/30/2018   IR US GUIDE VASC ACCESS RIGHT  09/30/2018   NASAL SINUS SURGERY     OPEN REDUCTION INTERNAL FIXATION (ORIF) TIBIA/FIBULA FRACTURE Right 10/09/2018   Procedure: OPEN REDUCTION INTERNAL FIXATION (ORIF) RIGHT PILON FRACTURE;  Surgeon: Shona Needles, MD;  Location: [place];  Service: Orthopedics;  Laterality: Right;   ORIF PELVIC FRACTURE WITH PERCUTANEOUS SCREWS Left 10/03/2018   Procedure: ORIF PELVIC FRACTURE WITH PERCUTANEOUS SCREWS;  Surgeon: Shona Needles, MD;  Location: [place];  Service: Orthopedics;  Laterality: Left;   SHOULDER SURGERY     SHOULDER SURGERY Bilateral    Rotator cuff repair   TONSILLECTOMY       No family history on file.   Social History   Tobacco Use   Smoking status: Former Smoker    Years: 10.00   Smokeless tobacco: Former Systems developer   Tobacco comment: quit in his 16's  Substance Use Topics   Alcohol use: No   Drug use: Not Currently      has no history on file for sexual activity.   Outpatient Medications Prior to Visit  Medication Sig Dispense Refill   ALPRAZolam (XANAX) 2 MG tablet Take 1 tablet (2 mg total) by mouth 2 (two) times daily. 30 tablet 0   Ascorbic Acid (VITAMIN C) 1000 MG tablet Take 1,000 mg by mouth 2 (two) times a day.     aspirin EC 325 MG EC tablet Take 1 tablet (325 mg total) by mouth daily. 30 tablet 0   baclofen 5 MG TABS Take 5 mg by mouth 3 (three) times daily. 90 tablet 0   Carboxymethylcellulose Sodium (THERATEARS) 0.25 % SOLN Place 1 drop into both eyes 3 (three) times daily as needed (dry eyes).     ceFEPime (MAXIPIME) IVPB Inject 2 g into the vein every 12 (twelve) hours. Indication:  RLE osteomyelitis Last Day of Therapy:   01/18/2019 Labs - Once weekly:  CBC/D and BMP, Labs - Every other week:  ESR and CRP 73 Units 0   cholecalciferol (VITAMIN D3) 25 MCG (1000 UT) tablet Take 1,000 Units by mouth 2 (two) times a day.      Omega-3 Fatty Acids (FISH OIL) 1000 MG CAPS Take 1,000 mg by mouth 2 (two) times a day.     omeprazole (PRILOSEC) 20 MG capsule Take 2 capsules (40 mg total) by mouth daily. 30 capsule 0   oxyCODONE (OXY IR/ROXICODONE) 5 MG immediate release tablet Take 1-2 tablets (5-10 mg total) by mouth every 6 (six) hours as needed for moderate pain or severe pain (pain score 4-6). 40 tablet 0   pravastatin (PRAVACHOL) 40 MG tablet Take 1 tablet (40 mg total) by mouth daily. (Patient taking differently: Take 40 mg by mouth every evening. ) 30 tablet 0   QUEtiapine (SEROQUEL)  200 MG tablet Take 1 tablet by mouth daily.     silver sulfADIAZINE (SILVADENE) 1 % cream Apply 1 application topically daily. 50 g 0   sodium chloride (OCEAN) 0.65 % SOLN nasal spray Place 1 spray into both nostrils 3 (three) times daily. (Patient taking differently: Place 1 spray into both nostrils as needed for congestion. ) 30 mL 0   temazepam (RESTORIL) 30 MG capsule Take 1 capsule (30 mg total) by mouth at bedtime. 20 capsule 0   vitamin B-12 (CYANOCOBALAMIN) 1000 MCG tablet Take 1,000 mcg by mouth 2 (two) times a day.     No facility-administered medications prior to visit.      No Known Allergies    Review of Systems  Constitutional: Negative for chills, fatigue and fever.  HENT: Negative for congestion, hearing loss, rhinorrhea and sinus pressure.   Eyes: Negative for photophobia, pain, redness and visual disturbance.  Respiratory: Negative for apnea, cough, shortness of breath and wheezing.   Cardiovascular: Negative for chest pain and palpitations.  Gastrointestinal: Negative for abdominal pain, constipation, diarrhea, nausea and vomiting.  Endocrine: Negative for cold intolerance, heat intolerance, polydipsia and  polyuria.  Genitourinary: Negative for decreased urine volume, dysuria, frequency, hematuria and testicular pain.  Musculoskeletal: Positive for arthralgias and joint swelling. Negative for back pain, myalgias and neck pain.       RT ankle  Skin: Negative for pallor and rash.  Allergic/Immunologic: Negative for immunocompromised state.  Neurological: Negative for dizziness, seizures, syncope, speech difficulty and light-headedness.  Hematological: Does not bruise/bleed easily.  Psychiatric/Behavioral: Negative for agitation and hallucinations. The patient is not nervous/anxious.        Objective:     Physical Exam Gen: pleasant/cooperative, NAD, A&Ox 3 Head: NCAT, no temporal wasting evident EENT: PERRL, EOMI, MMM, adequate dentition Neck: supple, no JVD CV: NRRR, no murmurs evident Pulm: CTA bilaterally, no wheeze or retractions Abd: soft, NTND, +BS Extrems: trace LE edema, 2+ pulses Skin: no rashes, adequate skin turgor MSK: RT ankle wound covered with compressive bandage w/o active drainage Neuro: CN II-XII grossly intact, no focal neurologic deficits appreciated, gait was assisted by pt's ambulation with walker, A&Ox 3   Labs: Lab Results  Component Value Date   WBC 7.9 12/06/2018   HGB 13.2 12/06/2018   HCT 41.3 12/06/2018   MCV 91.4 12/06/2018   PLT 219 12/06/2018   Lab Results  Component Value Date   NA 138 12/12/2018   K 3.3 (L) 12/12/2018   CL 99 12/12/2018   CO2 27 12/12/2018   GLUCOSE 169 (H) 12/12/2018   BUN 14 12/12/2018   CREATININE 0.83 12/12/2018   CALCIUM 9.5 12/12/2018   PHOS 2.1 (L) 10/05/2018   No results found for: CRP      Assessment & Plan:  Kyle Mcconnell is a 63 y/o male with history of PTSD, pre-diabetes and hypertension who experienced a traumatic comminuted tibial fracture s/p external fixation replaced by internal fixation which has been complicated with Enterobacter Cloacae infection now s/p hardware removal and application of external  fixation and wound VAC with placement of antibiotic beads.  Polymicrobial osteomyelitis s/p hardware removal - New culture data with enterobacter, staphylococcus epidermidis, and fingoldia magna. Question if staphylococcus epidermidis is a contaminant versus true pathogen. When orthopedics obtained new cultures with repeat debridement, only Enterobacter cloacae grew, indicating it as the dominant pathogen. When he had a subsequent debridement as an OP on 12/20/2018, cxs were negative.  Tentative end date for his IV cefepime will  be shifted from January 18, 2019 to June 11th.  Weekly labs show improvement in his white blood cell count and CRP.  Follow-up in 2 weeks time for reassessment  PTSD -patient now admits to a history of bipolar disease as well.  Neither of these diagnoses were known when he was an inpatient, so his psychotropic medications were held.  This may explain the patient's erratic behaviors towards the end of his last hospitalization.  He now has resumed his outpatient antipsychotics and his affect and cooperation are markedly improved.

## 2019-01-08 ENCOUNTER — Encounter: Payer: Self-pay | Admitting: Orthopedic Surgery

## 2019-01-08 ENCOUNTER — Other Ambulatory Visit: Payer: Self-pay | Admitting: Physician Assistant

## 2019-01-08 DIAGNOSIS — T847XXS Infection and inflammatory reaction due to other internal orthopedic prosthetic devices, implants and grafts, sequela: Secondary | ICD-10-CM
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
## 2019-01-10 ENCOUNTER — Telehealth: Payer: Self-pay | Admitting: Adult Health

## 2019-01-10 NOTE — Telephone Encounter (Signed)
Pt's fiance called to inform us that the pt was taken to the hospital yesterday and that his baclofen 5 MG TABS was increased to 10mg  TID. Please advise.

## 2019-01-11 ENCOUNTER — Other Ambulatory Visit: Payer: Federal, State, Local not specified - PPO

## 2019-01-11 ENCOUNTER — Telehealth: Payer: Self-pay | Admitting: Cardiology

## 2019-01-11 DIAGNOSIS — Z20822 Contact with and (suspected) exposure to covid-19: Secondary | ICD-10-CM

## 2019-01-11 NOTE — Telephone Encounter (Signed)
Spoke with patient regarding recent office visit 5/26. With the possible exposure to [employer].  Order placed for today at [facility] at 4pm.  Patient 22 caregiver was also with patient during office visit, she will be tested as well  Care giver is Theodoro Parma 10/31/1953

## 2019-01-13 LAB — NOVEL CORONAVIRUS, NAA: SARS-CoV-2, NAA: NOT DETECTED

## 2019-01-13 NOTE — Telephone Encounter (Signed)
I talk with Felicia pts fiancee that per Shanda Bumps NP no need to change medication. He can take the 10mg  tid. I stated pt has a in office in two weeks and everything can be discuss at that time. Felicia wanted the visit move up. I stated being that they were possibly exposed to COVID 19 we need to wait on test results. They are both being quarantine now until they get the results back. She verbalized understanding and will let us know the COVID 19 results. I stated If pt is clear only he is allowed in the exam room. Felicia understood our guidelines.

## 2019-01-13 NOTE — Telephone Encounter (Signed)
Noted.  No indication for changes at this time.  He has an appointment scheduled in the next 2 weeks

## 2019-01-14 ENCOUNTER — Ambulatory Visit: Payer: Federal, State, Local not specified - PPO | Admitting: Infectious Diseases

## 2019-01-14 ENCOUNTER — Telehealth: Payer: Self-pay

## 2019-01-14 ENCOUNTER — Other Ambulatory Visit: Payer: Self-pay

## 2019-01-14 ENCOUNTER — Ambulatory Visit (INDEPENDENT_AMBULATORY_CARE_PROVIDER_SITE_OTHER): Payer: Federal, State, Local not specified - PPO | Admitting: Orthopedic Surgery

## 2019-01-14 ENCOUNTER — Encounter: Payer: Self-pay | Admitting: Orthopedic Surgery

## 2019-01-14 VITALS — Ht 73.0 in | Wt 185.0 lb

## 2019-01-14 DIAGNOSIS — T847XXS Infection and inflammatory reaction due to other internal orthopedic prosthetic devices, implants and grafts, sequela: Secondary | ICD-10-CM

## 2019-01-14 NOTE — Telephone Encounter (Signed)
His Covid test was negative, so can come in.

## 2019-01-14 NOTE — Telephone Encounter (Signed)
LMOM for patient letting him know results were negative

## 2019-01-14 NOTE — Telephone Encounter (Signed)
See below

## 2019-01-14 NOTE — Telephone Encounter (Signed)
Felicia called on behalf of paitent. Stated had COVID test on Saturday and appt today at 3:45 and wanted to know if they still are ok to come in for appointment. Please have Dr Magnus Ivan review and call patient to advise if ok to come in [phone]

## 2019-01-16 ENCOUNTER — Other Ambulatory Visit: Payer: Self-pay | Admitting: Physician Assistant

## 2019-01-16 DIAGNOSIS — T847XXS Infection and inflammatory reaction due to other internal orthopedic prosthetic devices, implants and grafts, sequela: Secondary | ICD-10-CM

## 2019-01-20 NOTE — Telephone Encounter (Signed)
Patient caregiver called in wanting to know if he could get something stronger than the Tylenol prefers the oxyCODONE (OXY IR/ROXICODONE) 5 MG immediate release tablet. Patients caregiver also requesting silver sulfADIAZINE (SILVADENE) 1 % cream. Please call patient back when refill and Rx is sent. Pharmacy is [facility] [place]

## 2019-01-22 ENCOUNTER — Telehealth: Payer: Self-pay

## 2019-01-22 NOTE — Telephone Encounter (Signed)
Received a call today from [facility] with [facility]. Stanton Kidney would like to know if it is okay to pull patient's picc today or wait until after appointment on 6/11. Advised to [facility] that picc line should stay in place until after his appointment with our office. Ashley

## 2019-01-23 ENCOUNTER — Ambulatory Visit (INDEPENDENT_AMBULATORY_CARE_PROVIDER_SITE_OTHER): Payer: Federal, State, Local not specified - PPO | Admitting: Infectious Diseases

## 2019-01-23 ENCOUNTER — Other Ambulatory Visit: Payer: Self-pay

## 2019-01-23 ENCOUNTER — Encounter: Payer: Self-pay | Admitting: Infectious Diseases

## 2019-01-23 VITALS — BP 162/77 | HR 105

## 2019-01-23 DIAGNOSIS — F4310 Post-traumatic stress disorder, unspecified: Secondary | ICD-10-CM | POA: Diagnosis not present

## 2019-01-23 DIAGNOSIS — M86161 Other acute osteomyelitis, right tibia and fibula: Secondary | ICD-10-CM

## 2019-01-23 MED ORDER — DOXYCYCLINE HYCLATE 100 MG PO TABS: 100.0000 mg | ORAL_TABLET | Freq: Two times a day (BID) | ORAL | 0 refills | Status: DC

## 2019-01-23 NOTE — Patient Instructions (Signed)
Keep wound dry and clean at all times. Only use sterile water when cleaning wound. Start oral doxycycline 100 mg twice a day to prevent re-infection while wound remains open. Maximize protein rich foods in diet.

## 2019-01-23 NOTE — Progress Notes (Signed)
Subjective:    Patient ID: Kyle Mcconnell, male    DOB: [birth year], 63 y.o.   MRN: [id number]  HPI The patient is a 63 year old African-American male with a history of PTSD and prior stroke who suffered a comminuted right ankle fracture in February 2020 and subsequently has developed right ankle osteomyelitis/prosthetic joint infection.    He was last seen in our office on Jan 07, 2019 at which time he was continuing IV cefepime without event.  Our service saw him during his recent hospitalization in late April and [facility].  His ankle hardware was removed at the time of his surgery in early April.  Operative cultures were initially polymicrobial with Enterobacter cloacae, Finegoldia magna, and coag negative staph.  He underwent several debridements as an inpatient with repeat operative cultures confirming only Enterobacter, allowing for de-escalation of his antibiotics to cefepime monotherapy.  His wife is present today at his visit and states that he has been doing well and tolerating his medications without event.  Outpatient laboratories show an improvement in his white blood cell count as well as a CRP.  He did have a further debridement performed as an outpatient that showed negative cultures, so his end date for ABX was extended to January 23, 2019 (today).    He brings pictures of his wound with him today, showing that the wound is healing well and clean but remains rather wide open.  I had a brief discussion with the patient regarding his risk for reinfection to his wound.  He is agreeable to transitioning to oral antibiotics now that his IV antibiotic course has been completed.  Would anticipate the patient will only need oral antibiotics until he achieves wound healing in total.  He is anxious to have his PICC line removed today.  Past Medical History:  Diagnosis Date  . AKI (acute kidney injury) (HCC) 09/2018   Rhabdonyolsis- resolved  . Anxiety   .  Complication of anesthesia    "loopy, combative when waking up"  . Depression   . GERD (gastroesophageal reflux disease)   . Hepatitis C    Treated- 2016 ish  . High cholesterol   . History of blood transfusion   . History of hepatitis C   . HTN (hypertension)   . Memory change    post accident 09/2018  . Post traumatic stress disorder (PTSD)   . Pre-diabetes   . PTSD (post-traumatic stress disorder)   . PTSD (post-traumatic stress disorder)   . Stroke [facility](HCC) 09/2018   Left Basal Ganglia Ischemic Infarct, weakness and right sided weakness, right leg shakes when stressed    Past Surgical History:  Procedure Laterality Date  . COLONOSCOPY    . EXTERNAL FIXATION LEG Right 09/30/2018   Procedure: EXTERNAL FIXATION LEG;  Surgeon: Yolonda Kidaogers, Jason Patrick, MD;  [facility];  Service: Orthopedics;  Laterality: Right;  . EXTERNAL FIXATION LEG Right 10/03/2018   Procedure: EXTERNAL FIXATION LEG;  Surgeon: Roby LoftsHaddix, Kevin P, MD;  Location: [facility] OR;  Service: Orthopedics;  Laterality: Right;  . HARDWARE REMOVAL Right 12/06/2018   Procedure: REMOVAL DEEP HARDWARE RIGHT LEG APPLICATION EXTERNAL FIXATOR;  Surgeon: Nadara Mustarduda, Marcus V, MD;  Location: [facility] OR;  Service: Orthopedics;  Laterality: Right;  . I&D EXTREMITY Right 12/06/2018   Procedure: WOUND DEBRIDEMENT RIGHT LEG;  Surgeon: Nadara Mustarduda, Marcus V, MD;  [facility];  Service: Orthopedics;  Laterality: Right;  . I&D EXTREMITY Right 12/11/2018   Procedure: REPEAT DEBRIDEMENT  RIGHT LEG WOUND, A-CELL, VAC placement.;  Surgeon: Nadara Mustarduda, Marcus V, MD;  [facility];  Service: Orthopedics;  Laterality: Right;  . I&D EXTREMITY Right 12/20/2018   Procedure: DEBRIDEMENT RIGHT ANKLE AND APPLY COLLAGEN GRAFT;  Surgeon: Nadara Mustarduda, Marcus V, MD;  [facility];  Service: Orthopedics;  Laterality: Right;  . IR ANGIOGRAM EXTREMITY RIGHT  09/30/2018  . IR ANGIOGRAM PELVIS SELECTIVE OR SUPRASELECTIVE  09/30/2018  . IR ANGIOGRAM SELECTIVE EACH ADDITIONAL VESSEL  09/30/2018  . IR  EMBO ART  VEN HEMORR LYMPH EXTRAV  INC GUIDE ROADMAPPING  09/30/2018  . IR US GUIDE VASC ACCESS RIGHT  09/30/2018  . NASAL SINUS SURGERY    . OPEN REDUCTION INTERNAL FIXATION (ORIF) TIBIA/FIBULA FRACTURE Right 10/09/2018   Procedure: OPEN REDUCTION INTERNAL FIXATION (ORIF) RIGHT PILON FRACTURE;  Surgeon: Roby LoftsHaddix, Kevin P, MD;  [facility]: [facility] OR;  Service: Orthopedics;  Laterality: Right;  . ORIF PELVIC FRACTURE WITH PERCUTANEOUS SCREWS Left 10/03/2018   Procedure: ORIF PELVIC FRACTURE WITH PERCUTANEOUS SCREWS;  Surgeon: Roby LoftsHaddix, Kevin P, MD;  [facility]: [facility] OR;  Service: Orthopedics;  Laterality: Left;  . SHOULDER SURGERY    . SHOULDER SURGERY Bilateral    Rotator cuff repair  . TONSILLECTOMY       No family history on file.   Social History   Tobacco Use  . Smoking status: Former Smoker    Years: 10.00  . Smokeless tobacco: Former NeurosurgeonUser  . Tobacco comment: quit in his 30's  Substance Use Topics  . Alcohol use: No  . Drug use: Not Currently      has no history on file for sexual activity.   Outpatient Medications Prior to Visit  Medication Sig Dispense Refill  . ALPRAZolam (XANAX) 2 MG tablet Take 1 tablet (2 mg total) by mouth 2 (two) times daily. 30 tablet 0  . Ascorbic Acid (VITAMIN C) 1000 MG tablet Take 1,000 mg by mouth 2 (two) times a day.    Marland Kitchen. aspirin EC 325 MG EC tablet Take 1 tablet (325 mg total) by mouth daily. 30 tablet 0  . baclofen 5 MG TABS Take 5 mg by mouth 3 (three) times daily. 90 tablet 0  . Carboxymethylcellulose Sodium (THERATEARS) 0.25 % SOLN Place 1 drop into both eyes 3 (three) times daily as needed (dry eyes).    . cholecalciferol (VITAMIN D3) 25 MCG (1000 UT) tablet Take 1,000 Units by mouth 2 (two) times a day.     . Omega-3 Fatty Acids (FISH OIL) 1000 MG CAPS Take 1,000 mg by mouth 2 (two) times a day.    Marland Kitchen. omeprazole (PRILOSEC) 20 MG capsule Take 2 capsules (40 mg total) by mouth daily. 30 capsule 0  . oxyCODONE (OXY IR/ROXICODONE) 5 MG immediate release  tablet Take 1-2 tablets (5-10 mg total) by mouth every 6 (six) hours as needed for moderate pain or severe pain (pain score 4-6). 40 tablet 0  . pravastatin (PRAVACHOL) 40 MG tablet Take 1 tablet (40 mg total) by mouth daily. (Patient taking differently: Take 40 mg by mouth every evening. ) 30 tablet 0  . QUEtiapine (SEROQUEL) 200 MG tablet Take 1 tablet by mouth daily.    . sodium chloride (OCEAN) 0.65 % SOLN nasal spray Place 1 spray into both nostrils 3 (three) times daily. (Patient taking differently: Place 1 spray into both nostrils as needed for congestion. ) 30 mL 0  . SSD 1 % cream APPLY EXTERNALLY TO THE AFFECTED AREA DAILY 50 g 0  . temazepam (  RESTORIL) 30 MG capsule Take 1 capsule (30 mg total) by mouth at bedtime. 20 capsule 0  . vitamin B-12 (CYANOCOBALAMIN) 1000 MCG tablet Take 1,000 mcg by mouth 2 (two) times a day.     No facility-administered medications prior to visit.      No Known Allergies    Review of Systems  Constitutional: Positive for fatigue. Negative for chills and fever.  HENT: Negative for congestion, hearing loss, rhinorrhea and sinus pressure.   Eyes: Negative for photophobia, pain, redness and visual disturbance.  Respiratory: Negative for apnea, cough, shortness of breath and wheezing.   Cardiovascular: Negative for chest pain and palpitations.  Gastrointestinal: Negative for abdominal pain, constipation, diarrhea, nausea and vomiting.  Endocrine: Negative for cold intolerance, heat intolerance, polydipsia and polyuria.  Genitourinary: Negative for decreased urine volume, dysuria, frequency, hematuria and testicular pain.  Musculoskeletal: Positive for arthralgias and joint swelling. Negative for back pain, myalgias and neck pain.       RT ankle  Skin: Negative for pallor and rash.  Allergic/Immunologic: Negative for immunocompromised state.  Neurological: Negative for dizziness, seizures, syncope, speech difficulty and light-headedness.  Hematological:  Does not bruise/bleed easily.  Psychiatric/Behavioral: Negative for agitation and hallucinations. The patient is nervous/anxious.        Objective:    Vitals:   01/23/19 1118  BP: (!) 162/77  Pulse: (!) 105   Physical Exam Gen: pleasant/cooperative, NAD, A&Ox 3 Head: NCAT, no temporal wasting evident EENT: PERRL, EOMI, MMM, adequate dentition Neck: supple, no JVD CV: NRRR, no murmurs evident Pulm: CTA bilaterally, no wheeze or retractions Abd: soft, NTND, +BS Extrems: trace LE edema, 2+ pulses Skin: no rashes, adequate skin turgor MSK: RT ankle wound covered with compressive bandage w/o active drainage Neuro: CN II-XII grossly intact, no focal neurologic deficits appreciated, gait was assisted by pt's ambulation with walker, A&Ox 3 Labs: Lab Results  Component Value Date   WBC 7.9 12/06/2018   HGB 13.2 12/06/2018   HCT 41.3 12/06/2018   MCV 91.4 12/06/2018   PLT 219 12/06/2018   Lab Results  Component Value Date   NA 138 12/12/2018   K 3.3 (L) 12/12/2018   CL 99 12/12/2018   CO2 27 12/12/2018   GLUCOSE 169 (H) 12/12/2018   BUN 14 12/12/2018   CREATININE 0.83 12/12/2018   CALCIUM 9.5 12/12/2018   PHOS 2.1 (L) 10/05/2018   No results found for: CRP     Assessment & Plan:  The patient is a 63 y/o AA male with history of PTSD, pre-diabetes and hypertension who experienced a traumatic comminuted tibial fracture s/p external fixation replaced by internal fixation which has been complicated with Enterobacter Cloacae infection now s/p hardware removal and application of external fixation and wound VAC with placement of antibiotic beads.  Polymicrobial osteomyelitis s/p hardware removal - New culture data with enterobacter, staphylococcus epidermidis, and fingoldia magna. Question if staphylococcus epidermidis is a contaminant versus true pathogen. When orthopedics obtained new cultures with repeat debridement, only Enterobacter cloacae grew, indicating it as the dominant  pathogen. When he had a subsequent debridement as an OP on 12/20/2018, cxs were negative.    And his course of IV antibiotics today.  I have asked our nurses to remove his PICC line in the office as he has now completed treatment. Weekly labs show improvement in his white blood cell count and CRP.  Given his remnant open wound, as precautionary measure will start him on doxycycline 100 mg p.o. twice daily prophylactically to  prevent reinfection to his surgical site.  The patient understands that this is not targeted any pathogen but is simply meant to be preventative.  He was educated on how to properly clean his wound and encouraged to eat protein rich foods. Follow-up in 6 weeks time for reassessment  PTSD -patient now admits to a history of bipolar disease as well.  Neither of these diagnoses were known when he was an inpatient, so his psychotropic medications were held.  This may explain the patient's erratic behaviors towards the end of his last hospitalization.  He now has resumed his outpatient antipsychotics and his affect and cooperation are markedly improved.

## 2019-01-23 NOTE — Progress Notes (Signed)
Per verbal order from Dr Prince Rome, 40 cm Single Lumen Peripherally Inserted Central Catheter removed from right basilic, tip intact. No sutures present. RN confirmed length per chart. Dressing was clean and dry. Petroleum dressing applied. Pt advised no heavy lifting with this arm, leave dressing for 24 hours and call the office or seek emergent care if dressing becomes soaked with blood or swelling, sharp pain, or difficulty breathing presents. Patient verbalized understanding and agreement.  Patient and his wife's questions answered to their satisfaction. Patient tolerated procedure well, patient's wife walked patient to check out. RN notified Coretta at [facility], Cassie Kuppelweiser at [employer]. Landis Gandy, RN

## 2019-01-24 ENCOUNTER — Encounter: Payer: Self-pay | Admitting: Orthopedic Surgery
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
## 2019-01-28 ENCOUNTER — Encounter: Payer: Self-pay | Admitting: Orthopedic Surgery

## 2019-01-28 ENCOUNTER — Ambulatory Visit (INDEPENDENT_AMBULATORY_CARE_PROVIDER_SITE_OTHER): Payer: Federal, State, Local not specified - PPO | Admitting: Physician Assistant

## 2019-01-28 ENCOUNTER — Other Ambulatory Visit: Payer: Self-pay

## 2019-01-28 DIAGNOSIS — T847XXS Infection and inflammatory reaction due to other internal orthopedic prosthetic devices, implants and grafts, sequela: Secondary | ICD-10-CM

## 2019-01-28 MED ORDER — OXYCODONE HCL 5 MG PO TABS: 5.0000 mg | ORAL_TABLET | Freq: Four times a day (QID) | ORAL | 0 refills | Status: DC | PRN
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
## 2019-01-29 ENCOUNTER — Encounter: Payer: Self-pay | Admitting: Physician Assistant

## 2019-01-31 ENCOUNTER — Other Ambulatory Visit: Payer: Self-pay | Admitting: Infectious Diseases

## 2019-02-03 ENCOUNTER — Telehealth: Payer: Self-pay | Admitting: *Deleted

## 2019-02-03 MED ORDER — DOXYCYCLINE HYCLATE 100 MG PO TABS: 100.0000 mg | ORAL_TABLET | Freq: Two times a day (BID) | ORAL | 0 refills | Status: DC

## 2019-02-03 NOTE — Telephone Encounter (Signed)
Patient fiance called to report that he is out of the Doxy he was prescribed at his visit 01/23/19. She advised they were told he would be on it for at least until he returned to see the doctor but he was only given 10 days worth and he does not see the doctor until 02/22/19. She just wants to make sure he does not need anymore. Advised will have to ask the provider and give her a call back once he responds.  Called patient back and advised refill has been sent in and to continue taking the medication.

## 2019-02-04 ENCOUNTER — Other Ambulatory Visit: Payer: Self-pay

## 2019-02-04 ENCOUNTER — Encounter: Payer: Self-pay | Admitting: Adult Health

## 2019-02-04 ENCOUNTER — Ambulatory Visit (INDEPENDENT_AMBULATORY_CARE_PROVIDER_SITE_OTHER): Payer: Federal, State, Local not specified - PPO | Admitting: Adult Health

## 2019-02-04 VITALS — BP 156/100 | HR 94 | Temp 98.2°F

## 2019-02-04 DIAGNOSIS — F411 Generalized anxiety disorder: Secondary | ICD-10-CM

## 2019-02-04 DIAGNOSIS — G4452 New daily persistent headache (NDPH): Secondary | ICD-10-CM | POA: Diagnosis not present

## 2019-02-04 DIAGNOSIS — E785 Hyperlipidemia, unspecified: Secondary | ICD-10-CM | POA: Diagnosis not present

## 2019-02-04 DIAGNOSIS — G4484 Primary exertional headache: Secondary | ICD-10-CM | POA: Diagnosis not present

## 2019-02-04 DIAGNOSIS — F430 Acute stress reaction: Secondary | ICD-10-CM

## 2019-02-04 DIAGNOSIS — R471 Dysarthria and anarthria: Secondary | ICD-10-CM

## 2019-02-04 DIAGNOSIS — I639 Cerebral infarction, unspecified: Secondary | ICD-10-CM

## 2019-02-04 DIAGNOSIS — G8111 Spastic hemiplegia affecting right dominant side: Secondary | ICD-10-CM

## 2019-02-04 DIAGNOSIS — I10 Essential (primary) hypertension: Secondary | ICD-10-CM

## 2019-02-04 MED ORDER — SERTRALINE HCL 25 MG PO TABS: 25.0000 mg | ORAL_TABLET | Freq: Every day | ORAL | 2 refills | Status: AC

## 2019-02-04 MED ORDER — BACLOFEN 10 MG PO TABS: 15.0000 mg | ORAL_TABLET | Freq: Three times a day (TID) | ORAL | 0 refills | Status: AC

## 2019-02-04 NOTE — Patient Instructions (Signed)
Continue aspirin 325 mg daily  and pravastatin for secondary stroke prevention  Continue to follow up with PCP regarding cholesterol and blood pressure management   Order placed for CT scans due to new onset of headaches  We will start Zoloft 25 mg daily due to increased anxiety since her stroke -please allow 4 to 6 weeks to take full effect.  You can discuss possible increase of dose when you follow-up with your PCP as this will be in approximately 4 weeks  Due to ongoing spasticity, we will increase baclofen dosage from 10 mg 3 times daily to 15 mg 3 times daily  Continue to participate in home therapy and notify office once home therapies are completed as it would be recommended for you to participate in additional outpatient therapies  Please schedule follow-up appointment with your prior ENT for possible need of cauterization due to ongoing nosebleeds ENT provider in [place] : Ear nose throat head & neck telephone number [phone]  Continue to monitor blood pressure at home and ensure you write down readings and to follow-up with your PCP in regards to your ongoing fluctuation of blood pressure readings.  Recent fluctuations could be due to new onset headaches, increased anxiety and pain.  Please ensure stress reduction techniques and decrease sodium intake and maintain a healthy diet and exercise as tolerated  Highly recommend evaluation by our sleep specialists for evaluation of potential underlying sleep apnea due to excessive daytime fatigue despite adequate sleep at night.  Please let me know if you are interested and referral will be placed.   Maintain strict control of hypertension with blood pressure goal below 130/90, diabetes with hemoglobin A1c goal below 6.5% and cholesterol with LDL cholesterol (bad cholesterol) goal below 70 mg/dL. I also advised the patient to eat a healthy diet with plenty of whole grains, cereals, fruits and vegetables, exercise regularly and maintain  ideal body weight.  Followup in the future with me in 2 months or call earlier if needed       Thank you for coming to see [place] at [facility]. I hope we have been able to provide you high quality care today.  You may receive a patient satisfaction survey over the next few weeks. We would appreciate your feedback and comments so that we may continue to improve ourselves and the health of our patients.
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
## 2019-02-05 ENCOUNTER — Telehealth: Payer: Self-pay | Admitting: Adult Health

## 2019-02-05 LAB — BASIC METABOLIC PANEL
BUN/Creatinine Ratio: 14 (ref 10–24)
BUN: 10 mg/dL (ref 8–27)
CO2: 24 mmol/L (ref 20–29)
Calcium: 9.9 mg/dL (ref 8.6–10.2)
Chloride: 100 mmol/L (ref 96–106)
Creatinine, Ser: 0.7 mg/dL — ABNORMAL LOW (ref 0.76–1.27)
GFR calc Af Amer: 115 mL/min/{1.73_m2} (ref >59)
GFR calc non Af Amer: 100 mL/min/{1.73_m2} (ref >59)
Glucose: 104 mg/dL — ABNORMAL HIGH (ref 65–99)
Potassium: 3.6 mmol/L (ref 3.5–5.2)
Sodium: 144 mmol/L (ref 134–144)

## 2019-02-05 LAB — LIPID PANEL
Chol/HDL Ratio: 5.1 ratio — ABNORMAL HIGH (ref 0.0–5.0)
Cholesterol, Total: 197 mg/dL (ref 100–199)
HDL: 39 mg/dL — ABNORMAL LOW (ref >39)
LDL Calculated: 112 mg/dL — ABNORMAL HIGH (ref 0–99)
Triglycerides: 231 mg/dL — ABNORMAL HIGH (ref 0–149)
VLDL Cholesterol Cal: 46 mg/dL — ABNORMAL HIGH (ref 5–40)

## 2019-02-05 NOTE — Telephone Encounter (Signed)
Medicare/bcbs fed/VA order sent to GI. No auth they will reach out to the patient to schedule.

## 2019-02-06 ENCOUNTER — Telehealth: Payer: Self-pay | Admitting: Neurology

## 2019-02-06 ENCOUNTER — Encounter: Payer: Self-pay | Admitting: Adult Health

## 2019-02-06 MED ORDER — ATORVASTATIN CALCIUM 80 MG PO TABS: 80.0000 mg | ORAL_TABLET | Freq: Every day | ORAL | 3 refills | Status: AC

## 2019-02-06 NOTE — Telephone Encounter (Signed)
-----   Message from Jessica Vanschaick, NP sent at 02/05/2019  8:21 AM EDT ----- Please advise patient that his recent blood work to undergo imaging was satisfactory.  His lipid panel did show overall elevation compared to prior lab work.  Please ensure that he is taking pravastatin daily and if so, it would be recommended to change him to atorvastatin 80 mg daily for better cholesterol management.  Please let me know if this order needs to be placed. 

## 2019-02-06 NOTE — Addendum Note (Signed)
Addended by: Venancio Poisson on: 02/06/2019 10:23 AM   Modules accepted: Orders

## 2019-02-06 NOTE — Telephone Encounter (Signed)
-----   Message from Venancio Poisson, NP sent at 02/05/2019  8:21 AM EDT ----- Please advise patient that his recent blood work to undergo imaging was satisfactory.  His lipid panel did show overall elevation compared to prior lab work.  Please ensure that he is taking pravastatin daily and if so, it would be recommended to change him to atorvastatin 80 mg daily for better cholesterol management.  Please let me know if this order needs to be placed.

## 2019-02-06 NOTE — Telephone Encounter (Signed)
I called pt to state the lipitor was sent to [facility] of 80mg  daily. He was advised to discontinue the pravastatin and start taking the lipitor when he gets its refill. Pt verbalized understanding.

## 2019-02-06 NOTE — Telephone Encounter (Signed)
I called pt about his lipid panel lab work being elevated compared to prior labs. Pt stated he is taking the pravastatin 40 mg at night daily. I stated Janett Billow NP recommend changing him to atorvastatin 80mg  daily for better cholesterol management. Pt verbalized understanding and would like new meds sent to [facility].

## 2019-02-06 NOTE — Progress Notes (Signed)
I agree with the above plan 

## 2019-02-07 ENCOUNTER — Other Ambulatory Visit: Payer: Self-pay

## 2019-02-07 ENCOUNTER — Encounter: Payer: Self-pay | Admitting: Emergency Medicine

## 2019-02-07 ENCOUNTER — Ambulatory Visit
Admission: EM | Admit: 2019-02-07 | Discharge: 2019-02-07 | Disposition: A | Discharge status: Home / Self Care | Payer: Non-veteran care | Attending: Family Medicine | Admitting: Family Medicine

## 2019-02-07 DIAGNOSIS — I10 Essential (primary) hypertension: Secondary | ICD-10-CM | POA: Diagnosis not present

## 2019-02-07 MED ORDER — LISINOPRIL 10 MG PO TABS: 10.0000 mg | ORAL_TABLET | Freq: Every day | ORAL | 0 refills | Status: AC

## 2019-02-07 NOTE — ED Triage Notes (Signed)
Pt sent here by the [place], states they told him that he needed blood work done to determine the cause of his continued high blood pressure (188/119 at [facility] today).
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
## 2019-02-07 NOTE — ED Notes (Signed)
Patient able to ambulate independently  

## 2019-02-07 NOTE — Discharge Instructions (Signed)
Please begin lisinopril 10 mg, this dose may need to be increased overtime Continue to monitor blood pressure at home; may follow up here in the meantime for blood pressure recheck Please monitor your blood pressure at home or when you go to a [facility]. Please follow up with your primary care doctor to recheck blood pressure and discuss any need for medication changes.   Please go to Emergency Room if you start to experience severe headache, vision changes, decreased urine production, chest pain, shortness of breath, speech slurring, one sided weakness.

## 2019-02-11 ENCOUNTER — Ambulatory Visit: Payer: Federal, State, Local not specified - PPO | Admitting: Internal Medicine

## 2019-02-11 ENCOUNTER — Encounter: Payer: Self-pay | Admitting: Orthopedic Surgery

## 2019-02-11 ENCOUNTER — Ambulatory Visit (INDEPENDENT_AMBULATORY_CARE_PROVIDER_SITE_OTHER): Payer: Non-veteran care | Admitting: Orthopedic Surgery

## 2019-02-11 ENCOUNTER — Other Ambulatory Visit: Payer: Self-pay

## 2019-02-11 DIAGNOSIS — S81801D Unspecified open wound, right lower leg, subsequent encounter: Secondary | ICD-10-CM

## 2019-02-11 DIAGNOSIS — M86461 Chronic osteomyelitis with draining sinus, right tibia and fibula: Secondary | ICD-10-CM
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
## 2019-02-20 ENCOUNTER — Other Ambulatory Visit: Payer: Self-pay

## 2019-02-20 ENCOUNTER — Ambulatory Visit
Admission: RE | Admit: 2019-02-20 | Discharge: 2019-02-20 | Disposition: A | Discharge status: Home / Self Care | Payer: Medicare Other | Source: Ambulatory Visit | Attending: Adult Health | Admitting: Adult Health

## 2019-02-20 DIAGNOSIS — G4484 Primary exertional headache: Secondary | ICD-10-CM

## 2019-02-20 MED ORDER — IOPAMIDOL (ISOVUE-300) INJECTION 61%
75.0000 mL | Freq: Once | INTRAVENOUS | Status: AC | PRN
  Administered 2019-02-20: 15:00:00 75 mL via INTRAVENOUS

## 2019-02-24 ENCOUNTER — Telehealth: Payer: Self-pay | Admitting: Orthopedic Surgery

## 2019-02-24 ENCOUNTER — Other Ambulatory Visit: Payer: Self-pay

## 2019-02-24 ENCOUNTER — Ambulatory Visit (INDEPENDENT_AMBULATORY_CARE_PROVIDER_SITE_OTHER): Payer: Medicare Other | Admitting: Infectious Diseases

## 2019-02-24 ENCOUNTER — Encounter: Payer: Self-pay | Admitting: Infectious Diseases

## 2019-02-24 VITALS — BP 156/90 | HR 90 | Temp 98.0°F | Ht 73.5 in

## 2019-02-24 DIAGNOSIS — S82121K Displaced fracture of lateral condyle of right tibia, subsequent encounter for closed fracture with nonunion: Secondary | ICD-10-CM

## 2019-02-24 DIAGNOSIS — M86161 Other acute osteomyelitis, right tibia and fibula: Secondary | ICD-10-CM | POA: Diagnosis not present

## 2019-02-24 MED ORDER — DOXYCYCLINE HYCLATE 100 MG PO TABS: 100.0000 mg | ORAL_TABLET | Freq: Two times a day (BID) | ORAL | 1 refills | Status: DC

## 2019-02-24 NOTE — Patient Instructions (Signed)
Continue doxycycline until return visit in 2 months. Please bring recent pictures of wound on your phone to next visit (like today).

## 2019-02-24 NOTE — Telephone Encounter (Signed)
Received a call from pts daughter, Amika regarding FMLA forms. Several parts were not filled out right. I gave her my email address and asked her to email exactly each part that is incorrect and indicate what she needs it to be. Callback # [phone]
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
## 2019-02-25 ENCOUNTER — Telehealth: Payer: Self-pay | Admitting: *Deleted

## 2019-02-25 NOTE — Telephone Encounter (Signed)
-----   Message from Venancio Poisson, NP sent at 02/24/2019  2:00 PM EDT ----- Please notify patient that his recent CT scan did not show any acute findings

## 2019-02-25 NOTE — Telephone Encounter (Signed)
I called pt and relayed that per Kyle Mcconnell / np that the CT scan did not show any acute findings.  He was glad about this.  Appreciated call.

## 2019-02-27 ENCOUNTER — Other Ambulatory Visit: Payer: Self-pay

## 2019-02-27 ENCOUNTER — Other Ambulatory Visit: Payer: Self-pay | Admitting: *Deleted

## 2019-02-27 ENCOUNTER — Ambulatory Visit (INDEPENDENT_AMBULATORY_CARE_PROVIDER_SITE_OTHER): Payer: Medicare Other | Admitting: Physician Assistant

## 2019-02-27 ENCOUNTER — Encounter: Payer: Self-pay | Admitting: Physician Assistant

## 2019-02-27 VITALS — Ht 73.5 in | Wt 185.0 lb

## 2019-02-27 DIAGNOSIS — M86461 Chronic osteomyelitis with draining sinus, right tibia and fibula: Secondary | ICD-10-CM

## 2019-02-27 DIAGNOSIS — S81801D Unspecified open wound, right lower leg, subsequent encounter: Secondary | ICD-10-CM

## 2019-02-27 MED ORDER — DOXYCYCLINE HYCLATE 100 MG PO TABS: 100.0000 mg | ORAL_TABLET | Freq: Two times a day (BID) | ORAL | 1 refills | Status: DC
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
## 2019-03-03 ENCOUNTER — Ambulatory Visit (INDEPENDENT_AMBULATORY_CARE_PROVIDER_SITE_OTHER): Payer: Medicare Other | Admitting: Orthopedic Surgery

## 2019-03-03 ENCOUNTER — Encounter: Payer: Self-pay | Admitting: Orthopedic Surgery

## 2019-03-03 VITALS — Ht 73.0 in | Wt 185.0 lb

## 2019-03-03 DIAGNOSIS — T847XXS Infection and inflammatory reaction due to other internal orthopedic prosthetic devices, implants and grafts, sequela: Secondary | ICD-10-CM

## 2019-03-03 DIAGNOSIS — S81801D Unspecified open wound, right lower leg, subsequent encounter: Secondary | ICD-10-CM

## 2019-03-03 DIAGNOSIS — M86461 Chronic osteomyelitis with draining sinus, right tibia and fibula: Secondary | ICD-10-CM

## 2019-03-06 ENCOUNTER — Other Ambulatory Visit: Payer: Self-pay

## 2019-03-06 ENCOUNTER — Ambulatory Visit (INDEPENDENT_AMBULATORY_CARE_PROVIDER_SITE_OTHER): Payer: Medicare Other | Admitting: Physician Assistant

## 2019-03-06 ENCOUNTER — Encounter: Payer: Self-pay | Admitting: Orthopedic Surgery

## 2019-03-06 VITALS — Ht 73.0 in | Wt 185.0 lb

## 2019-03-06 DIAGNOSIS — S81801D Unspecified open wound, right lower leg, subsequent encounter: Secondary | ICD-10-CM

## 2019-03-06 DIAGNOSIS — T847XXS Infection and inflammatory reaction due to other internal orthopedic prosthetic devices, implants and grafts, sequela: Secondary | ICD-10-CM

## 2019-03-06 DIAGNOSIS — M86461 Chronic osteomyelitis with draining sinus, right tibia and fibula: Secondary | ICD-10-CM
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
## 2019-03-07 ENCOUNTER — Telehealth: Payer: Self-pay | Admitting: Orthopedic Surgery

## 2019-03-07 ENCOUNTER — Encounter: Payer: Self-pay | Admitting: Orthopedic Surgery
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
## 2019-03-07 NOTE — Telephone Encounter (Signed)
Kyle Mcconnell with the [facility] called needing the 03/06/2019 office note  Faxed to her. The fax number is [phone]   Ph# is [phone]

## 2019-03-07 NOTE — Telephone Encounter (Signed)
Pending completion of dictation will fax once this is complete.

## 2019-03-07 NOTE — Telephone Encounter (Signed)
Patients daughter Rockney Ghee called regarding the FMLA forms she has requested to be amended. Please call to discuss. Her callback number [phone]

## 2019-03-10 ENCOUNTER — Ambulatory Visit (INDEPENDENT_AMBULATORY_CARE_PROVIDER_SITE_OTHER): Payer: Medicare Other | Admitting: Physician Assistant

## 2019-03-10 ENCOUNTER — Encounter: Payer: Self-pay | Admitting: Orthopedic Surgery

## 2019-03-10 ENCOUNTER — Other Ambulatory Visit: Payer: Self-pay

## 2019-03-10 VITALS — Ht 73.0 in | Wt 185.0 lb

## 2019-03-10 DIAGNOSIS — M86461 Chronic osteomyelitis with draining sinus, right tibia and fibula: Secondary | ICD-10-CM

## 2019-03-10 DIAGNOSIS — S81801D Unspecified open wound, right lower leg, subsequent encounter: Secondary | ICD-10-CM

## 2019-03-10 DIAGNOSIS — T847XXS Infection and inflammatory reaction due to other internal orthopedic prosthetic devices, implants and grafts, sequela: Secondary | ICD-10-CM

## 2019-03-10 NOTE — Telephone Encounter (Signed)
Office visit note faxed to number requested.
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
## 2019-03-11 ENCOUNTER — Ambulatory Visit: Payer: Federal, State, Local not specified - PPO | Admitting: Orthopedic Surgery

## 2019-03-14 ENCOUNTER — Telehealth: Payer: Self-pay | Admitting: Orthopedic Surgery

## 2019-03-14 MED ORDER — OXYCODONE HCL 5 MG PO TABS: 5.0000 mg | ORAL_TABLET | Freq: Four times a day (QID) | ORAL | 0 refills | Status: DC | PRN

## 2019-03-14 NOTE — Addendum Note (Signed)
Addended by: Milas Gain on: 03/14/2019 02:07 PM   Modules accepted: Orders

## 2019-03-14 NOTE — Telephone Encounter (Signed)
Can you advise please  

## 2019-03-14 NOTE — Telephone Encounter (Signed)
Sent refill for oxycodone as requested.

## 2019-03-14 NOTE — Telephone Encounter (Signed)
Patient called needing Rx refilled (Oxycodone) The number to contact patient is [phone]

## 2019-03-27 ENCOUNTER — Ambulatory Visit (INDEPENDENT_AMBULATORY_CARE_PROVIDER_SITE_OTHER): Payer: Medicare Other | Admitting: Orthopedic Surgery

## 2019-03-27 ENCOUNTER — Encounter: Payer: Self-pay | Admitting: Orthopedic Surgery

## 2019-03-27 VITALS — Ht 73.0 in | Wt 185.0 lb

## 2019-03-27 DIAGNOSIS — S81801D Unspecified open wound, right lower leg, subsequent encounter: Secondary | ICD-10-CM | POA: Diagnosis not present

## 2019-03-27 DIAGNOSIS — I63412 Cerebral infarction due to embolism of left middle cerebral artery: Secondary | ICD-10-CM

## 2019-03-31 ENCOUNTER — Telehealth: Payer: Self-pay | Admitting: Orthopedic Surgery

## 2019-03-31 NOTE — Telephone Encounter (Signed)
Patient's finance/care giver called stating that the patient received a letter from [facility] stating that the material Dr. Sharol Given used for the graph is experimental and will not cover it.  She wants to know what exactly she needs to do in order for Dr. Sharol Given to get paid.  CB#[phone].  Thank you.

## 2019-04-08 ENCOUNTER — Encounter: Payer: Self-pay | Admitting: Orthopedic Surgery
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
## 2019-04-10 ENCOUNTER — Ambulatory Visit (INDEPENDENT_AMBULATORY_CARE_PROVIDER_SITE_OTHER): Payer: Medicare Other

## 2019-04-10 ENCOUNTER — Ambulatory Visit (INDEPENDENT_AMBULATORY_CARE_PROVIDER_SITE_OTHER): Payer: Medicare Other | Admitting: Orthopedic Surgery

## 2019-04-10 ENCOUNTER — Encounter: Payer: Self-pay | Admitting: Orthopedic Surgery

## 2019-04-10 VITALS — Ht 73.0 in | Wt 185.0 lb

## 2019-04-10 DIAGNOSIS — T847XXS Infection and inflammatory reaction due to other internal orthopedic prosthetic devices, implants and grafts, sequela: Secondary | ICD-10-CM | POA: Diagnosis not present

## 2019-04-10 DIAGNOSIS — S81801D Unspecified open wound, right lower leg, subsequent encounter: Secondary | ICD-10-CM | POA: Diagnosis not present

## 2019-04-10 DIAGNOSIS — I63412 Cerebral infarction due to embolism of left middle cerebral artery: Secondary | ICD-10-CM | POA: Diagnosis not present

## 2019-04-10 DIAGNOSIS — M86461 Chronic osteomyelitis with draining sinus, right tibia and fibula: Secondary | ICD-10-CM

## 2019-04-10 MED ORDER — GABAPENTIN 300 MG PO CAPS: 300.0000 mg | ORAL_CAPSULE | Freq: Three times a day (TID) | ORAL | 3 refills | Status: DC

## 2019-04-10 MED ORDER — OXYCODONE HCL 5 MG PO TABS: 5.0000 mg | ORAL_TABLET | Freq: Four times a day (QID) | ORAL | 0 refills | Status: DC | PRN

## 2019-04-11 ENCOUNTER — Encounter: Payer: Self-pay | Admitting: Orthopedic Surgery
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
## 2019-04-30 ENCOUNTER — Encounter: Payer: Self-pay | Admitting: Infectious Diseases

## 2019-04-30 ENCOUNTER — Other Ambulatory Visit: Payer: Self-pay

## 2019-04-30 ENCOUNTER — Ambulatory Visit (INDEPENDENT_AMBULATORY_CARE_PROVIDER_SITE_OTHER): Payer: Medicare Other | Admitting: Infectious Diseases

## 2019-04-30 VITALS — BP 156/93 | HR 96 | Temp 98.0°F

## 2019-04-30 DIAGNOSIS — S82121K Displaced fracture of lateral condyle of right tibia, subsequent encounter for closed fracture with nonunion: Secondary | ICD-10-CM | POA: Diagnosis not present

## 2019-04-30 DIAGNOSIS — F4310 Post-traumatic stress disorder, unspecified: Secondary | ICD-10-CM | POA: Diagnosis not present

## 2019-04-30 DIAGNOSIS — M86161 Other acute osteomyelitis, right tibia and fibula: Secondary | ICD-10-CM

## 2019-04-30 MED ORDER — DOXYCYCLINE HYCLATE 100 MG PO TABS: 100.0000 mg | ORAL_TABLET | Freq: Two times a day (BID) | ORAL | 3 refills | Status: DC

## 2019-04-30 MED ORDER — DOXYCYCLINE HYCLATE 100 MG PO TABS: 100.0000 mg | ORAL_TABLET | Freq: Two times a day (BID) | ORAL | 1 refills | Status: DC

## 2019-04-30 NOTE — Patient Instructions (Signed)
Continue doxycycline until next visit unless wound closes. Return to clinic in 3 months.
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
## 2019-05-01 LAB — BASIC METABOLIC PANEL
BUN: 14 mg/dL (ref 7–25)
CO2: 26 mmol/L (ref 20–32)
Calcium: 9.4 mg/dL (ref 8.6–10.3)
Chloride: 101 mmol/L (ref 98–110)
Creat: 0.86 mg/dL (ref 0.70–1.25)
Glucose, Bld: 149 mg/dL — ABNORMAL HIGH (ref 65–99)
Potassium: 3.8 mmol/L (ref 3.5–5.3)
Sodium: 141 mmol/L (ref 135–146)

## 2019-05-01 LAB — CBC WITH DIFFERENTIAL/PLATELET
Absolute Monocytes: 592 cells/uL (ref 200–950)
Basophils Absolute: 32 cells/uL (ref 0–200)
Basophils Relative: 0.5 %
Eosinophils Absolute: 63 cells/uL (ref 15–500)
Eosinophils Relative: 1 %
HCT: 44.2 % (ref 38.5–50.0)
Hemoglobin: 14.9 g/dL (ref 13.2–17.1)
Lymphs Abs: 1852 cells/uL (ref 850–3900)
MCH: 29.1 pg (ref 27.0–33.0)
MCHC: 33.7 g/dL (ref 32.0–36.0)
MCV: 86.3 fL (ref 80.0–100.0)
MPV: 10.9 fL (ref 7.5–12.5)
Monocytes Relative: 9.4 %
Neutro Abs: 3761 cells/uL (ref 1500–7800)
Neutrophils Relative %: 59.7 %
Platelets: 194 10*3/uL (ref 140–400)
RBC: 5.12 10*6/uL (ref 4.20–5.80)
RDW: 13.2 % (ref 11.0–15.0)
Total Lymphocyte: 29.4 %
WBC: 6.3 10*3/uL (ref 3.8–10.8)

## 2019-05-01 LAB — C-REACTIVE PROTEIN: CRP: 2.6 mg/L (ref <8.0)

## 2019-05-05 ENCOUNTER — Other Ambulatory Visit: Payer: Self-pay

## 2019-05-05 ENCOUNTER — Ambulatory Visit (INDEPENDENT_AMBULATORY_CARE_PROVIDER_SITE_OTHER): Payer: Medicare Other | Admitting: Orthopedic Surgery

## 2019-05-05 ENCOUNTER — Encounter: Payer: Self-pay | Admitting: Orthopedic Surgery

## 2019-05-05 VITALS — Ht 73.0 in | Wt 185.0 lb

## 2019-05-05 DIAGNOSIS — I63412 Cerebral infarction due to embolism of left middle cerebral artery: Secondary | ICD-10-CM

## 2019-05-05 DIAGNOSIS — M86461 Chronic osteomyelitis with draining sinus, right tibia and fibula: Secondary | ICD-10-CM

## 2019-05-05 DIAGNOSIS — T847XXS Infection and inflammatory reaction due to other internal orthopedic prosthetic devices, implants and grafts, sequela: Secondary | ICD-10-CM | POA: Diagnosis not present

## 2019-05-05 DIAGNOSIS — S81801D Unspecified open wound, right lower leg, subsequent encounter: Secondary | ICD-10-CM
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
## 2019-05-13 ENCOUNTER — Telehealth: Payer: Self-pay | Admitting: Orthopedic Surgery

## 2019-05-13 DIAGNOSIS — T847XXS Infection and inflammatory reaction due to other internal orthopedic prosthetic devices, implants and grafts, sequela: Secondary | ICD-10-CM

## 2019-05-13 DIAGNOSIS — S81801D Unspecified open wound, right lower leg, subsequent encounter: Secondary | ICD-10-CM

## 2019-05-13 MED ORDER — OXYCODONE HCL 5 MG PO TABS: 5.0000 mg | ORAL_TABLET | Freq: Three times a day (TID) | ORAL | 0 refills | Status: DC | PRN

## 2019-05-13 NOTE — Telephone Encounter (Signed)
Patient called. He would like a refill on the Oxycodone sent to [facility] [place] [facility] [place] His call back number is [phone]

## 2019-05-13 NOTE — Addendum Note (Signed)
Addended by: Dondra Prader R on: 05/13/2019 04:50 PM   Modules accepted: Orders

## 2019-05-13 NOTE — Telephone Encounter (Signed)
Erin please advise, thank you.  

## 2019-05-19 ENCOUNTER — Telehealth: Payer: Self-pay | Admitting: Orthopedic Surgery

## 2019-05-19 NOTE — Telephone Encounter (Signed)
Patient left a voicemail requesting a referral be sent to [facility] for PT.  CB#[phone].  Thank you.

## 2019-05-20 NOTE — Telephone Encounter (Signed)
[facility] will be sent last office note along with Rx for patient.
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
## 2019-06-02 ENCOUNTER — Encounter: Payer: Self-pay | Admitting: Orthopedic Surgery

## 2019-06-02 ENCOUNTER — Other Ambulatory Visit: Payer: Self-pay

## 2019-06-02 ENCOUNTER — Ambulatory Visit (INDEPENDENT_AMBULATORY_CARE_PROVIDER_SITE_OTHER): Payer: No Typology Code available for payment source | Admitting: Orthopedic Surgery

## 2019-06-02 VITALS — Ht 73.0 in | Wt 185.0 lb

## 2019-06-02 DIAGNOSIS — T847XXS Infection and inflammatory reaction due to other internal orthopedic prosthetic devices, implants and grafts, sequela: Secondary | ICD-10-CM

## 2019-06-03 ENCOUNTER — Encounter: Payer: Self-pay | Admitting: Orthopedic Surgery
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
## 2019-06-16 ENCOUNTER — Other Ambulatory Visit: Payer: Self-pay

## 2019-06-16 ENCOUNTER — Ambulatory Visit (INDEPENDENT_AMBULATORY_CARE_PROVIDER_SITE_OTHER): Payer: No Typology Code available for payment source | Admitting: Orthopedic Surgery

## 2019-06-16 ENCOUNTER — Encounter: Payer: Self-pay | Admitting: Orthopedic Surgery

## 2019-06-16 ENCOUNTER — Ambulatory Visit (INDEPENDENT_AMBULATORY_CARE_PROVIDER_SITE_OTHER): Payer: No Typology Code available for payment source

## 2019-06-16 VITALS — Ht 73.0 in | Wt 185.0 lb

## 2019-06-16 DIAGNOSIS — T847XXS Infection and inflammatory reaction due to other internal orthopedic prosthetic devices, implants and grafts, sequela: Secondary | ICD-10-CM | POA: Diagnosis not present

## 2019-06-16 DIAGNOSIS — S81801D Unspecified open wound, right lower leg, subsequent encounter: Secondary | ICD-10-CM
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
## 2019-06-19 ENCOUNTER — Encounter: Payer: Self-pay | Admitting: Physical Therapy

## 2019-06-19 ENCOUNTER — Other Ambulatory Visit: Payer: Self-pay

## 2019-06-19 ENCOUNTER — Ambulatory Visit (INDEPENDENT_AMBULATORY_CARE_PROVIDER_SITE_OTHER): Payer: Medicare Other | Admitting: Physical Therapy

## 2019-06-19 DIAGNOSIS — R2689 Other abnormalities of gait and mobility: Secondary | ICD-10-CM | POA: Diagnosis not present

## 2019-06-19 DIAGNOSIS — R2681 Unsteadiness on feet: Secondary | ICD-10-CM | POA: Diagnosis not present

## 2019-06-19 DIAGNOSIS — M6281 Muscle weakness (generalized): Secondary | ICD-10-CM

## 2019-06-19 DIAGNOSIS — M25671 Stiffness of right ankle, not elsewhere classified: Secondary | ICD-10-CM | POA: Diagnosis not present

## 2019-06-19 NOTE — Patient Instructions (Signed)
Access Code: [email or web address]  URL: [email or web address]  Date: 06/19/2019  Prepared by: Faustino Congress   Exercises  Seated Ankle Pumps on Table - 15 reps - 1 sets - 2x daily - 7x weekly  Seated Ankle Circles - 15 reps - 1 sets - 2x daily - 7x weekly  Seated Ankle Alphabet - 1 sets - 1 A-Z - 2x daily - 7x weekly  Seated Calf Stretch with Strap - 3 reps - 1 sets - 30 sec hold - 2x daily - 7x weekly
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
## 2019-06-24 ENCOUNTER — Ambulatory Visit (INDEPENDENT_AMBULATORY_CARE_PROVIDER_SITE_OTHER): Payer: Medicare Other | Admitting: Physical Therapy

## 2019-06-24 ENCOUNTER — Other Ambulatory Visit: Payer: Self-pay

## 2019-06-24 ENCOUNTER — Encounter: Payer: Self-pay | Admitting: Physical Therapy

## 2019-06-24 DIAGNOSIS — M25671 Stiffness of right ankle, not elsewhere classified: Secondary | ICD-10-CM | POA: Diagnosis not present

## 2019-06-24 DIAGNOSIS — M6281 Muscle weakness (generalized): Secondary | ICD-10-CM

## 2019-06-24 DIAGNOSIS — R2681 Unsteadiness on feet: Secondary | ICD-10-CM | POA: Diagnosis not present

## 2019-06-24 DIAGNOSIS — R2689 Other abnormalities of gait and mobility: Secondary | ICD-10-CM
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
## 2019-06-26 ENCOUNTER — Other Ambulatory Visit: Payer: Self-pay

## 2019-06-26 ENCOUNTER — Ambulatory Visit (INDEPENDENT_AMBULATORY_CARE_PROVIDER_SITE_OTHER): Payer: Medicare Other | Admitting: Physical Therapy

## 2019-06-26 ENCOUNTER — Encounter: Payer: Self-pay | Admitting: Physical Therapy

## 2019-06-26 DIAGNOSIS — R2681 Unsteadiness on feet: Secondary | ICD-10-CM | POA: Diagnosis not present

## 2019-06-26 DIAGNOSIS — M25671 Stiffness of right ankle, not elsewhere classified: Secondary | ICD-10-CM | POA: Diagnosis not present

## 2019-06-26 DIAGNOSIS — M6281 Muscle weakness (generalized): Secondary | ICD-10-CM | POA: Diagnosis not present

## 2019-06-26 DIAGNOSIS — R2689 Other abnormalities of gait and mobility: Secondary | ICD-10-CM

## 2019-06-26 NOTE — Patient Instructions (Signed)
Access Code: [email or web address]  URL: [email or web address]  Date: 06/26/2019  Prepared by: Faustino Congress   Exercises  Seated Ankle Pumps on Table - 15 reps - 1 sets - 2x daily - 7x weekly  Seated Ankle Circles - 15 reps - 1 sets - 2x daily - 7x weekly  Seated Ankle Alphabet - 1 sets - 1 A-Z - 2x daily - 7x weekly  Seated Calf Stretch with Strap - 3 reps - 1 sets - 30 sec hold - 2x daily - 7x weekly  Sidelying Hip Abduction - 2 sets - 20 reps - 2x daily - 7x weekly  Prone Hip Extension - 20 reps - 1 sets - 2x daily - 7x weekly  Prone Knee Flexion AROM - 20 reps - 1 sets - 2x daily - 7x weekly  Seated Long Arc Quad - 20 reps - 1 sets - 2x daily - 7x weekly  Standing Hip Extension with Counter Support - 20 reps - 1 sets - 1x daily - 7x weekly  Standing Hip Abduction with Counter Support - 20 reps - 1 sets - 1x daily - 7x weekly
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
## 2019-07-01 ENCOUNTER — Encounter: Payer: No Typology Code available for payment source | Admitting: Physical Therapy

## 2019-07-03 ENCOUNTER — Encounter: Payer: No Typology Code available for payment source | Admitting: Physical Therapy

## 2019-07-03 ENCOUNTER — Telehealth: Payer: Self-pay | Admitting: Orthopedic Surgery

## 2019-07-03 NOTE — Telephone Encounter (Signed)
06/16/2019 ov note faxed to [employer] [phone]

## 2019-07-07 ENCOUNTER — Encounter: Payer: No Typology Code available for payment source | Admitting: Physical Therapy

## 2019-07-09 ENCOUNTER — Encounter: Payer: No Typology Code available for payment source | Admitting: Physical Therapy

## 2019-07-14 ENCOUNTER — Encounter: Payer: Self-pay | Admitting: Orthopedic Surgery

## 2019-07-14 ENCOUNTER — Other Ambulatory Visit: Payer: Self-pay

## 2019-07-14 ENCOUNTER — Ambulatory Visit (INDEPENDENT_AMBULATORY_CARE_PROVIDER_SITE_OTHER): Payer: No Typology Code available for payment source | Admitting: Orthopedic Surgery

## 2019-07-14 VITALS — Ht 73.0 in | Wt 185.0 lb

## 2019-07-14 DIAGNOSIS — T847XXS Infection and inflammatory reaction due to other internal orthopedic prosthetic devices, implants and grafts, sequela: Secondary | ICD-10-CM | POA: Diagnosis not present

## 2019-07-14 DIAGNOSIS — S81801D Unspecified open wound, right lower leg, subsequent encounter: Secondary | ICD-10-CM

## 2019-07-15 ENCOUNTER — Encounter: Payer: No Typology Code available for payment source | Admitting: Physical Therapy

## 2019-07-15 ENCOUNTER — Encounter: Payer: Self-pay | Admitting: Orthopedic Surgery
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
## 2019-07-17 ENCOUNTER — Encounter: Payer: No Typology Code available for payment source | Admitting: Physical Therapy

## 2019-07-21 ENCOUNTER — Telehealth: Payer: Self-pay | Admitting: Orthopedic Surgery

## 2019-07-21 NOTE — Telephone Encounter (Signed)
Spoke to [place] will handle form for Medicaid to handle this week. Insurance require an approval first.

## 2019-07-21 NOTE — Telephone Encounter (Signed)
Patient's wife came in to the office in regards to the form. She and patient are upset it has taken three weeks and they still have nothing from [employer]. She states that [employer] told her they had sent form to [place] to be completed and they still have not received it.  After speaking with Autumn, I informed patient's wife that the form is most likely in the mail. I also explained that they had someone from [employer] that would bring the form here and have Dr. Sharol Given sign, or she could go and pick one up and bring it back for the signature.  Patient's wife requests representative from [employer] bring form for signature and then someone from our office call patient to let him know this has been completed.

## 2019-07-21 NOTE — Telephone Encounter (Signed)
Patient left a voicemail message in regards to the paper to order the brace from [place].  CB#[phone].  Thank you.

## 2019-07-24 ENCOUNTER — Telehealth: Payer: Self-pay

## 2019-07-24 NOTE — Telephone Encounter (Signed)
Attempted to call patient to reschedule appointment on 12/16 with Dr. Linus Salmons same day at 10:45. Left voicemail requesting patient call office back. [place]

## 2019-07-30 ENCOUNTER — Ambulatory Visit: Payer: Medicare Other | Admitting: Infectious Diseases

## 2019-07-30 ENCOUNTER — Ambulatory Visit: Payer: Medicare Other | Admitting: Internal Medicine

## 2019-08-14 ENCOUNTER — Ambulatory Visit (INDEPENDENT_AMBULATORY_CARE_PROVIDER_SITE_OTHER): Payer: No Typology Code available for payment source | Admitting: Orthopedic Surgery

## 2019-08-14 ENCOUNTER — Encounter: Payer: Self-pay | Admitting: Orthopedic Surgery

## 2019-08-14 ENCOUNTER — Other Ambulatory Visit: Payer: Self-pay

## 2019-08-14 DIAGNOSIS — S81801D Unspecified open wound, right lower leg, subsequent encounter: Secondary | ICD-10-CM

## 2019-08-14 DIAGNOSIS — T847XXS Infection and inflammatory reaction due to other internal orthopedic prosthetic devices, implants and grafts, sequela: Secondary | ICD-10-CM

## 2019-08-14 MED ORDER — OXYCODONE HCL 5 MG PO TABS: 5.0000 mg | ORAL_TABLET | Freq: Two times a day (BID) | ORAL | 0 refills | Status: AC | PRN
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
## 2019-08-19 ENCOUNTER — Ambulatory Visit (INDEPENDENT_AMBULATORY_CARE_PROVIDER_SITE_OTHER): Payer: No Typology Code available for payment source | Admitting: Internal Medicine

## 2019-08-19 ENCOUNTER — Encounter: Payer: Self-pay | Admitting: Internal Medicine

## 2019-08-19 ENCOUNTER — Other Ambulatory Visit: Payer: Self-pay

## 2019-08-19 ENCOUNTER — Telehealth: Payer: Self-pay | Admitting: Orthopaedic Surgery

## 2019-08-19 VITALS — BP 143/89 | HR 96 | Wt 211.0 lb

## 2019-08-19 DIAGNOSIS — T847XXS Infection and inflammatory reaction due to other internal orthopedic prosthetic devices, implants and grafts, sequela: Secondary | ICD-10-CM

## 2019-08-19 DIAGNOSIS — S81801D Unspecified open wound, right lower leg, subsequent encounter: Secondary | ICD-10-CM

## 2019-08-19 DIAGNOSIS — M86461 Chronic osteomyelitis with draining sinus, right tibia and fibula: Secondary | ICD-10-CM | POA: Diagnosis not present

## 2019-08-19 MED ORDER — SILVER SULFADIAZINE 1 % EX CREA: TOPICAL_CREAM | CUTANEOUS | 0 refills | Status: AC

## 2019-08-19 MED ORDER — DOXYCYCLINE HYCLATE 100 MG PO TABS: 100.0000 mg | ORAL_TABLET | Freq: Two times a day (BID) | ORAL | 0 refills | Status: AC

## 2019-08-19 NOTE — Telephone Encounter (Signed)
Tonya from the [place] called. She says that documentation for continued care for patient needs to be faxed in if Dr. Roda Shutters will continue seeing him, to [phone]. Her call back number is [phone].

## 2019-08-21 NOTE — Progress Notes (Signed)
   Subjective:    Patient ID: Kyle Mcconnell, male    DOB: 1956-05-15, 64 y.o.   MRN: [id number]  HPI Here for hsfu He has a history of an ankle francture of his right leg then osteomyelitis after ORIF with removal of the hardware.  His cultures grew Enterobacter cloacae, Finegoldia magna and CoNS and he was treated with cefepime for 6 weeks after multiple surgical debridements. Other surgical cultures only grew Enterobacter.  He improved and completed treatment in June of 2020.  He has had a persistent wound that is healing and continues to follow with Dr. Lajoyce Corners for it.  It is nearly closed.  He was started on doxycycline for this as a preventative measure, though has had no erythema, pus or other concerns.  He is interested in continuing with doxycycline until the wound closes.      Review of Systems  Constitutional: Negative for fever and unexpected weight change.  Gastrointestinal: Negative for diarrhea and nausea.  Skin: Negative for rash.       Objective:   Physical Exam Eyes:     General: No scleral icterus. Skin:    Comments: Wound is about 1 cm, no surrounding erythema, no pus  Neurological:     General: No focal deficit present.     Mental Status: He is alert.  Psychiatric:        Mood and Affect: Mood normal.           Assessment & Plan:

## 2019-08-21 NOTE — Assessment & Plan Note (Signed)
Wound looks good, no signs of infection.  I agreed to one more month of doxycycline and he won't need further antibiotics after that as there is no active infection.

## 2019-08-21 NOTE — Assessment & Plan Note (Signed)
He has completed treatment and having no further issues with this.   rtc as needed

## 2019-08-22 NOTE — Telephone Encounter (Signed)
Duda patient °

## 2019-08-25 NOTE — Telephone Encounter (Signed)
Faxed copy of last office visit note.

## 2019-08-26 ENCOUNTER — Encounter: Payer: Non-veteran care | Admitting: Physical Therapy

## 2019-08-28 ENCOUNTER — Ambulatory Visit (INDEPENDENT_AMBULATORY_CARE_PROVIDER_SITE_OTHER): Payer: Medicare Other | Admitting: Physical Therapy

## 2019-08-28 ENCOUNTER — Other Ambulatory Visit: Payer: Self-pay

## 2019-08-28 ENCOUNTER — Encounter: Payer: Self-pay | Admitting: Physical Therapy

## 2019-08-28 DIAGNOSIS — R2681 Unsteadiness on feet: Secondary | ICD-10-CM

## 2019-08-28 DIAGNOSIS — M6281 Muscle weakness (generalized): Secondary | ICD-10-CM | POA: Diagnosis not present

## 2019-08-28 DIAGNOSIS — R2689 Other abnormalities of gait and mobility: Secondary | ICD-10-CM

## 2019-08-28 DIAGNOSIS — M25671 Stiffness of right ankle, not elsewhere classified: Secondary | ICD-10-CM | POA: Diagnosis not present
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
## 2019-09-02 ENCOUNTER — Ambulatory Visit (INDEPENDENT_AMBULATORY_CARE_PROVIDER_SITE_OTHER): Payer: Medicare Other | Admitting: Physical Therapy

## 2019-09-02 ENCOUNTER — Other Ambulatory Visit: Payer: Self-pay

## 2019-09-02 DIAGNOSIS — M25671 Stiffness of right ankle, not elsewhere classified: Secondary | ICD-10-CM

## 2019-09-02 DIAGNOSIS — R2689 Other abnormalities of gait and mobility: Secondary | ICD-10-CM | POA: Diagnosis not present

## 2019-09-02 DIAGNOSIS — R2681 Unsteadiness on feet: Secondary | ICD-10-CM | POA: Diagnosis not present

## 2019-09-02 DIAGNOSIS — M6281 Muscle weakness (generalized): Secondary | ICD-10-CM | POA: Diagnosis not present
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
## 2019-09-04 ENCOUNTER — Encounter: Payer: Self-pay | Admitting: Physical Therapy

## 2019-09-04 ENCOUNTER — Other Ambulatory Visit: Payer: Self-pay

## 2019-09-04 ENCOUNTER — Ambulatory Visit (INDEPENDENT_AMBULATORY_CARE_PROVIDER_SITE_OTHER): Payer: Medicare Other | Admitting: Physical Therapy

## 2019-09-04 DIAGNOSIS — M6281 Muscle weakness (generalized): Secondary | ICD-10-CM

## 2019-09-04 DIAGNOSIS — R2681 Unsteadiness on feet: Secondary | ICD-10-CM

## 2019-09-04 DIAGNOSIS — R2689 Other abnormalities of gait and mobility: Secondary | ICD-10-CM | POA: Diagnosis not present

## 2019-09-04 DIAGNOSIS — M25671 Stiffness of right ankle, not elsewhere classified: Secondary | ICD-10-CM
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
## 2019-09-09 ENCOUNTER — Ambulatory Visit (INDEPENDENT_AMBULATORY_CARE_PROVIDER_SITE_OTHER): Payer: Medicare Other | Admitting: Physical Therapy

## 2019-09-09 ENCOUNTER — Other Ambulatory Visit: Payer: Self-pay

## 2019-09-09 DIAGNOSIS — M6281 Muscle weakness (generalized): Secondary | ICD-10-CM

## 2019-09-09 DIAGNOSIS — M25671 Stiffness of right ankle, not elsewhere classified: Secondary | ICD-10-CM | POA: Diagnosis not present

## 2019-09-09 DIAGNOSIS — R2689 Other abnormalities of gait and mobility: Secondary | ICD-10-CM

## 2019-09-09 DIAGNOSIS — R2681 Unsteadiness on feet: Secondary | ICD-10-CM
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
## 2019-09-11 ENCOUNTER — Other Ambulatory Visit: Payer: Self-pay

## 2019-09-11 ENCOUNTER — Ambulatory Visit (INDEPENDENT_AMBULATORY_CARE_PROVIDER_SITE_OTHER): Payer: Medicare Other | Admitting: Physical Therapy

## 2019-09-11 ENCOUNTER — Encounter: Payer: Self-pay | Admitting: Physical Therapy

## 2019-09-11 DIAGNOSIS — M25671 Stiffness of right ankle, not elsewhere classified: Secondary | ICD-10-CM

## 2019-09-11 DIAGNOSIS — M6281 Muscle weakness (generalized): Secondary | ICD-10-CM | POA: Diagnosis not present

## 2019-09-11 DIAGNOSIS — R2681 Unsteadiness on feet: Secondary | ICD-10-CM

## 2019-09-11 DIAGNOSIS — R2689 Other abnormalities of gait and mobility: Secondary | ICD-10-CM | POA: Diagnosis not present
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
## 2019-09-16 ENCOUNTER — Ambulatory Visit (INDEPENDENT_AMBULATORY_CARE_PROVIDER_SITE_OTHER): Payer: Medicare Other | Admitting: Physical Therapy

## 2019-09-16 ENCOUNTER — Encounter: Payer: Self-pay | Admitting: Physical Therapy

## 2019-09-16 ENCOUNTER — Other Ambulatory Visit: Payer: Self-pay

## 2019-09-16 DIAGNOSIS — M25671 Stiffness of right ankle, not elsewhere classified: Secondary | ICD-10-CM | POA: Diagnosis not present

## 2019-09-16 DIAGNOSIS — M6281 Muscle weakness (generalized): Secondary | ICD-10-CM | POA: Diagnosis not present

## 2019-09-16 DIAGNOSIS — R2681 Unsteadiness on feet: Secondary | ICD-10-CM

## 2019-09-16 DIAGNOSIS — R2689 Other abnormalities of gait and mobility: Secondary | ICD-10-CM
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
## 2019-09-18 ENCOUNTER — Telehealth: Payer: Self-pay | Admitting: Orthopedic Surgery

## 2019-09-18 ENCOUNTER — Encounter: Payer: Non-veteran care | Admitting: Physical Therapy

## 2019-09-18 ENCOUNTER — Ambulatory Visit (INDEPENDENT_AMBULATORY_CARE_PROVIDER_SITE_OTHER): Payer: Medicare Other | Admitting: Physical Therapy

## 2019-09-18 ENCOUNTER — Encounter: Payer: Self-pay | Admitting: Orthopedic Surgery

## 2019-09-18 ENCOUNTER — Ambulatory Visit (INDEPENDENT_AMBULATORY_CARE_PROVIDER_SITE_OTHER): Payer: Medicare Other | Admitting: Orthopedic Surgery

## 2019-09-18 ENCOUNTER — Other Ambulatory Visit: Payer: Self-pay

## 2019-09-18 VITALS — Ht 73.0 in | Wt 211.0 lb

## 2019-09-18 DIAGNOSIS — M6281 Muscle weakness (generalized): Secondary | ICD-10-CM

## 2019-09-18 DIAGNOSIS — R2681 Unsteadiness on feet: Secondary | ICD-10-CM

## 2019-09-18 DIAGNOSIS — M25671 Stiffness of right ankle, not elsewhere classified: Secondary | ICD-10-CM

## 2019-09-18 DIAGNOSIS — R2689 Other abnormalities of gait and mobility: Secondary | ICD-10-CM

## 2019-09-18 DIAGNOSIS — T847XXS Infection and inflammatory reaction due to other internal orthopedic prosthetic devices, implants and grafts, sequela: Secondary | ICD-10-CM

## 2019-09-18 DIAGNOSIS — S81801D Unspecified open wound, right lower leg, subsequent encounter: Secondary | ICD-10-CM | POA: Diagnosis not present
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
## 2019-09-18 NOTE — Telephone Encounter (Signed)
Pt was checking out for his appt and stated that the [facility] is needing [place] to fax over something to them so they can fax [place] back his new authorization for visits with Dr.Duda? Pt said if you have any questions give him a call.    [phone]

## 2019-09-19 NOTE — Telephone Encounter (Signed)
Completed [facility] referral form and will fax with last office visit note from this week.

## 2019-09-22 ENCOUNTER — Other Ambulatory Visit: Payer: Self-pay | Admitting: Orthopedic Surgery

## 2019-09-22 ENCOUNTER — Encounter: Payer: Non-veteran care | Admitting: Physical Therapy

## 2019-10-06 ENCOUNTER — Ambulatory Visit (INDEPENDENT_AMBULATORY_CARE_PROVIDER_SITE_OTHER): Payer: Medicare Other | Admitting: Physical Therapy

## 2019-10-06 ENCOUNTER — Encounter: Payer: Self-pay | Admitting: Physical Therapy

## 2019-10-06 ENCOUNTER — Other Ambulatory Visit: Payer: Self-pay

## 2019-10-06 DIAGNOSIS — M6281 Muscle weakness (generalized): Secondary | ICD-10-CM

## 2019-10-06 DIAGNOSIS — R2681 Unsteadiness on feet: Secondary | ICD-10-CM

## 2019-10-06 DIAGNOSIS — M25671 Stiffness of right ankle, not elsewhere classified: Secondary | ICD-10-CM

## 2019-10-06 DIAGNOSIS — R2689 Other abnormalities of gait and mobility: Secondary | ICD-10-CM
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
## 2019-10-10 ENCOUNTER — Other Ambulatory Visit: Payer: Self-pay

## 2019-10-10 ENCOUNTER — Ambulatory Visit (INDEPENDENT_AMBULATORY_CARE_PROVIDER_SITE_OTHER): Payer: Medicare Other | Admitting: Physical Therapy

## 2019-10-10 ENCOUNTER — Encounter: Payer: Self-pay | Admitting: Physical Therapy

## 2019-10-10 DIAGNOSIS — R2681 Unsteadiness on feet: Secondary | ICD-10-CM | POA: Diagnosis not present

## 2019-10-10 DIAGNOSIS — M25671 Stiffness of right ankle, not elsewhere classified: Secondary | ICD-10-CM

## 2019-10-10 DIAGNOSIS — R2689 Other abnormalities of gait and mobility: Secondary | ICD-10-CM | POA: Diagnosis not present

## 2019-10-10 DIAGNOSIS — M6281 Muscle weakness (generalized): Secondary | ICD-10-CM | POA: Diagnosis not present
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
## 2019-10-13 ENCOUNTER — Other Ambulatory Visit: Payer: Self-pay

## 2019-10-13 ENCOUNTER — Ambulatory Visit (INDEPENDENT_AMBULATORY_CARE_PROVIDER_SITE_OTHER): Payer: Medicare Other | Admitting: Physical Therapy

## 2019-10-13 ENCOUNTER — Encounter: Payer: Self-pay | Admitting: Physical Therapy

## 2019-10-13 DIAGNOSIS — M6281 Muscle weakness (generalized): Secondary | ICD-10-CM | POA: Diagnosis not present

## 2019-10-13 DIAGNOSIS — R2689 Other abnormalities of gait and mobility: Secondary | ICD-10-CM | POA: Diagnosis not present

## 2019-10-13 DIAGNOSIS — R2681 Unsteadiness on feet: Secondary | ICD-10-CM | POA: Diagnosis not present

## 2019-10-13 DIAGNOSIS — M25671 Stiffness of right ankle, not elsewhere classified: Secondary | ICD-10-CM | POA: Diagnosis not present
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
## 2019-10-15 ENCOUNTER — Other Ambulatory Visit: Payer: Self-pay

## 2019-10-15 ENCOUNTER — Ambulatory Visit (INDEPENDENT_AMBULATORY_CARE_PROVIDER_SITE_OTHER): Payer: Medicare Other | Admitting: Physical Therapy

## 2019-10-15 ENCOUNTER — Encounter: Payer: Self-pay | Admitting: Physical Therapy

## 2019-10-15 DIAGNOSIS — R2681 Unsteadiness on feet: Secondary | ICD-10-CM

## 2019-10-15 DIAGNOSIS — M6281 Muscle weakness (generalized): Secondary | ICD-10-CM | POA: Diagnosis not present

## 2019-10-15 DIAGNOSIS — R2689 Other abnormalities of gait and mobility: Secondary | ICD-10-CM

## 2019-10-15 DIAGNOSIS — M25671 Stiffness of right ankle, not elsewhere classified: Secondary | ICD-10-CM
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
## 2019-10-16 ENCOUNTER — Ambulatory Visit (INDEPENDENT_AMBULATORY_CARE_PROVIDER_SITE_OTHER): Payer: No Typology Code available for payment source

## 2019-10-16 ENCOUNTER — Ambulatory Visit: Payer: Non-veteran care | Admitting: Orthopedic Surgery

## 2019-10-16 ENCOUNTER — Ambulatory Visit (INDEPENDENT_AMBULATORY_CARE_PROVIDER_SITE_OTHER): Payer: No Typology Code available for payment source | Admitting: Orthopedic Surgery

## 2019-10-16 ENCOUNTER — Encounter: Payer: Self-pay | Admitting: Orthopedic Surgery

## 2019-10-16 VITALS — Ht 73.0 in | Wt 211.0 lb

## 2019-10-16 DIAGNOSIS — M25571 Pain in right ankle and joints of right foot: Secondary | ICD-10-CM | POA: Diagnosis not present
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
## 2019-10-21 ENCOUNTER — Encounter: Payer: Self-pay | Admitting: Physical Therapy

## 2019-10-21 ENCOUNTER — Other Ambulatory Visit: Payer: Self-pay

## 2019-10-21 ENCOUNTER — Ambulatory Visit (INDEPENDENT_AMBULATORY_CARE_PROVIDER_SITE_OTHER): Payer: No Typology Code available for payment source | Admitting: Physical Therapy

## 2019-10-21 DIAGNOSIS — R2689 Other abnormalities of gait and mobility: Secondary | ICD-10-CM

## 2019-10-21 DIAGNOSIS — M6281 Muscle weakness (generalized): Secondary | ICD-10-CM | POA: Diagnosis not present

## 2019-10-21 DIAGNOSIS — M25671 Stiffness of right ankle, not elsewhere classified: Secondary | ICD-10-CM | POA: Diagnosis not present

## 2019-10-21 DIAGNOSIS — R2681 Unsteadiness on feet: Secondary | ICD-10-CM | POA: Diagnosis not present
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
## 2019-10-23 ENCOUNTER — Other Ambulatory Visit: Payer: Self-pay

## 2019-10-23 ENCOUNTER — Ambulatory Visit (INDEPENDENT_AMBULATORY_CARE_PROVIDER_SITE_OTHER): Payer: No Typology Code available for payment source | Admitting: Physical Therapy

## 2019-10-23 DIAGNOSIS — R2689 Other abnormalities of gait and mobility: Secondary | ICD-10-CM | POA: Diagnosis not present

## 2019-10-23 DIAGNOSIS — M6281 Muscle weakness (generalized): Secondary | ICD-10-CM

## 2019-10-23 DIAGNOSIS — M25671 Stiffness of right ankle, not elsewhere classified: Secondary | ICD-10-CM

## 2019-10-23 DIAGNOSIS — R2681 Unsteadiness on feet: Secondary | ICD-10-CM
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
## 2019-10-27 ENCOUNTER — Encounter: Payer: Self-pay | Admitting: Physical Therapy

## 2019-10-27 ENCOUNTER — Ambulatory Visit (INDEPENDENT_AMBULATORY_CARE_PROVIDER_SITE_OTHER): Payer: No Typology Code available for payment source | Admitting: Physical Therapy

## 2019-10-27 ENCOUNTER — Other Ambulatory Visit: Payer: Self-pay

## 2019-10-27 DIAGNOSIS — R2681 Unsteadiness on feet: Secondary | ICD-10-CM

## 2019-10-27 DIAGNOSIS — R2689 Other abnormalities of gait and mobility: Secondary | ICD-10-CM

## 2019-10-27 DIAGNOSIS — M25671 Stiffness of right ankle, not elsewhere classified: Secondary | ICD-10-CM

## 2019-10-27 DIAGNOSIS — M6281 Muscle weakness (generalized): Secondary | ICD-10-CM
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
## 2019-10-28 ENCOUNTER — Encounter: Payer: Non-veteran care | Admitting: Physical Therapy

## 2019-11-04 ENCOUNTER — Encounter: Payer: Self-pay | Admitting: Physical Therapy

## 2019-11-04 ENCOUNTER — Ambulatory Visit (INDEPENDENT_AMBULATORY_CARE_PROVIDER_SITE_OTHER): Payer: No Typology Code available for payment source | Admitting: Physical Therapy

## 2019-11-04 ENCOUNTER — Encounter: Payer: Non-veteran care | Admitting: Physical Therapy

## 2019-11-04 ENCOUNTER — Other Ambulatory Visit: Payer: Self-pay

## 2019-11-04 DIAGNOSIS — R2681 Unsteadiness on feet: Secondary | ICD-10-CM | POA: Diagnosis not present

## 2019-11-04 DIAGNOSIS — R2689 Other abnormalities of gait and mobility: Secondary | ICD-10-CM

## 2019-11-04 DIAGNOSIS — M6281 Muscle weakness (generalized): Secondary | ICD-10-CM

## 2019-11-04 DIAGNOSIS — M25671 Stiffness of right ankle, not elsewhere classified: Secondary | ICD-10-CM
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
## 2019-11-06 ENCOUNTER — Other Ambulatory Visit: Payer: Self-pay

## 2019-11-06 ENCOUNTER — Ambulatory Visit (INDEPENDENT_AMBULATORY_CARE_PROVIDER_SITE_OTHER): Payer: No Typology Code available for payment source | Admitting: Physical Therapy

## 2019-11-06 ENCOUNTER — Encounter: Payer: Self-pay | Admitting: Physical Therapy

## 2019-11-06 DIAGNOSIS — M6281 Muscle weakness (generalized): Secondary | ICD-10-CM

## 2019-11-06 DIAGNOSIS — R2689 Other abnormalities of gait and mobility: Secondary | ICD-10-CM | POA: Diagnosis not present

## 2019-11-06 DIAGNOSIS — M25671 Stiffness of right ankle, not elsewhere classified: Secondary | ICD-10-CM | POA: Diagnosis not present

## 2019-11-06 DIAGNOSIS — R2681 Unsteadiness on feet: Secondary | ICD-10-CM | POA: Diagnosis not present
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
## 2019-11-10 ENCOUNTER — Other Ambulatory Visit: Payer: Self-pay

## 2019-11-10 ENCOUNTER — Ambulatory Visit (INDEPENDENT_AMBULATORY_CARE_PROVIDER_SITE_OTHER): Payer: No Typology Code available for payment source | Admitting: Physical Therapy

## 2019-11-10 DIAGNOSIS — M6281 Muscle weakness (generalized): Secondary | ICD-10-CM | POA: Diagnosis not present

## 2019-11-10 DIAGNOSIS — R2689 Other abnormalities of gait and mobility: Secondary | ICD-10-CM

## 2019-11-10 DIAGNOSIS — M25671 Stiffness of right ankle, not elsewhere classified: Secondary | ICD-10-CM

## 2019-11-10 DIAGNOSIS — R2681 Unsteadiness on feet: Secondary | ICD-10-CM | POA: Diagnosis not present
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
## 2019-11-18 ENCOUNTER — Encounter: Payer: Self-pay | Admitting: Physical Therapy

## 2019-11-18 ENCOUNTER — Ambulatory Visit (INDEPENDENT_AMBULATORY_CARE_PROVIDER_SITE_OTHER): Payer: No Typology Code available for payment source | Admitting: Physical Therapy

## 2019-11-18 ENCOUNTER — Other Ambulatory Visit: Payer: Self-pay

## 2019-11-18 DIAGNOSIS — R2689 Other abnormalities of gait and mobility: Secondary | ICD-10-CM

## 2019-11-18 DIAGNOSIS — R531 Weakness: Secondary | ICD-10-CM | POA: Diagnosis not present

## 2019-11-18 DIAGNOSIS — R2681 Unsteadiness on feet: Secondary | ICD-10-CM | POA: Diagnosis not present

## 2019-11-18 DIAGNOSIS — Z9181 History of falling: Secondary | ICD-10-CM

## 2019-11-18 DIAGNOSIS — R293 Abnormal posture: Secondary | ICD-10-CM
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
## 2019-11-21 ENCOUNTER — Ambulatory Visit (INDEPENDENT_AMBULATORY_CARE_PROVIDER_SITE_OTHER): Payer: Medicare Other | Admitting: Physical Therapy

## 2019-11-21 ENCOUNTER — Other Ambulatory Visit: Payer: Self-pay

## 2019-11-21 DIAGNOSIS — R531 Weakness: Secondary | ICD-10-CM

## 2019-11-21 DIAGNOSIS — R2689 Other abnormalities of gait and mobility: Secondary | ICD-10-CM | POA: Diagnosis not present

## 2019-11-21 DIAGNOSIS — R2681 Unsteadiness on feet: Secondary | ICD-10-CM | POA: Diagnosis not present

## 2019-11-21 DIAGNOSIS — R293 Abnormal posture: Secondary | ICD-10-CM | POA: Diagnosis not present

## 2019-11-21 DIAGNOSIS — M25671 Stiffness of right ankle, not elsewhere classified: Secondary | ICD-10-CM
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
## 2019-11-25 ENCOUNTER — Other Ambulatory Visit: Payer: Self-pay

## 2019-11-25 ENCOUNTER — Ambulatory Visit (INDEPENDENT_AMBULATORY_CARE_PROVIDER_SITE_OTHER): Payer: Medicare Other | Admitting: Physical Therapy

## 2019-11-25 ENCOUNTER — Encounter: Payer: Self-pay | Admitting: Physical Therapy

## 2019-11-25 DIAGNOSIS — R2681 Unsteadiness on feet: Secondary | ICD-10-CM | POA: Diagnosis not present

## 2019-11-25 DIAGNOSIS — R293 Abnormal posture: Secondary | ICD-10-CM

## 2019-11-25 DIAGNOSIS — R2689 Other abnormalities of gait and mobility: Secondary | ICD-10-CM

## 2019-11-25 DIAGNOSIS — M25671 Stiffness of right ankle, not elsewhere classified: Secondary | ICD-10-CM | POA: Diagnosis not present

## 2019-11-25 DIAGNOSIS — M6281 Muscle weakness (generalized): Secondary | ICD-10-CM
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
## 2019-11-27 ENCOUNTER — Ambulatory Visit (INDEPENDENT_AMBULATORY_CARE_PROVIDER_SITE_OTHER): Payer: Medicare Other | Admitting: Physical Therapy

## 2019-11-27 ENCOUNTER — Other Ambulatory Visit: Payer: Self-pay

## 2019-11-27 ENCOUNTER — Encounter: Payer: Self-pay | Admitting: Physical Therapy

## 2019-11-27 DIAGNOSIS — M25671 Stiffness of right ankle, not elsewhere classified: Secondary | ICD-10-CM | POA: Diagnosis not present

## 2019-11-27 DIAGNOSIS — R2681 Unsteadiness on feet: Secondary | ICD-10-CM | POA: Diagnosis not present

## 2019-11-27 DIAGNOSIS — R2689 Other abnormalities of gait and mobility: Secondary | ICD-10-CM | POA: Diagnosis not present

## 2019-11-27 DIAGNOSIS — R293 Abnormal posture: Secondary | ICD-10-CM

## 2019-11-27 DIAGNOSIS — M6281 Muscle weakness (generalized): Secondary | ICD-10-CM
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
## 2019-11-28 ENCOUNTER — Encounter: Payer: Non-veteran care | Admitting: Physical Therapy

## 2019-12-01 ENCOUNTER — Encounter: Payer: Self-pay | Admitting: Physical Therapy

## 2019-12-01 ENCOUNTER — Other Ambulatory Visit: Payer: Self-pay

## 2019-12-01 ENCOUNTER — Ambulatory Visit (INDEPENDENT_AMBULATORY_CARE_PROVIDER_SITE_OTHER): Payer: Medicare Other | Admitting: Physical Therapy

## 2019-12-01 DIAGNOSIS — M25671 Stiffness of right ankle, not elsewhere classified: Secondary | ICD-10-CM

## 2019-12-01 DIAGNOSIS — R2689 Other abnormalities of gait and mobility: Secondary | ICD-10-CM

## 2019-12-01 DIAGNOSIS — M6281 Muscle weakness (generalized): Secondary | ICD-10-CM

## 2019-12-01 DIAGNOSIS — R531 Weakness: Secondary | ICD-10-CM

## 2019-12-01 DIAGNOSIS — Z9181 History of falling: Secondary | ICD-10-CM

## 2019-12-01 DIAGNOSIS — R2681 Unsteadiness on feet: Secondary | ICD-10-CM | POA: Diagnosis not present
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
